# Patient Record
Sex: Male | Born: 1970 | Race: Black or African American | Hispanic: No | Marital: Single | Smoking: Current some day smoker
Health system: Southern US, Community
[De-identification: ages and names within clinical notes are randomized; demographics above are authoritative.]

## PROBLEM LIST (undated history)

## (undated) DIAGNOSIS — M5430 Sciatica, unspecified side: Secondary | ICD-10-CM

## (undated) DIAGNOSIS — J45909 Unspecified asthma, uncomplicated: Secondary | ICD-10-CM

## (undated) DIAGNOSIS — K219 Gastro-esophageal reflux disease without esophagitis: Secondary | ICD-10-CM

## (undated) DIAGNOSIS — M1990 Unspecified osteoarthritis, unspecified site: Secondary | ICD-10-CM

## (undated) DIAGNOSIS — J449 Chronic obstructive pulmonary disease, unspecified: Secondary | ICD-10-CM

---

## 2006-08-10 ENCOUNTER — Emergency Department (HOSPITAL_COMMUNITY): Admission: EM | Admit: 2006-08-10 | Discharge: 2006-08-10 | Payer: Self-pay | Admitting: Emergency Medicine

## 2006-09-27 ENCOUNTER — Emergency Department (HOSPITAL_COMMUNITY): Admission: EM | Admit: 2006-09-27 | Discharge: 2006-09-27 | Payer: Self-pay | Admitting: Emergency Medicine

## 2007-02-14 ENCOUNTER — Emergency Department (HOSPITAL_COMMUNITY): Admission: EM | Admit: 2007-02-14 | Discharge: 2007-02-14 | Payer: Self-pay | Admitting: Emergency Medicine

## 2007-03-16 ENCOUNTER — Ambulatory Visit: Payer: Self-pay | Admitting: Internal Medicine

## 2007-04-01 ENCOUNTER — Ambulatory Visit: Payer: Self-pay | Admitting: Internal Medicine

## 2007-05-16 ENCOUNTER — Ambulatory Visit: Payer: Self-pay | Admitting: Internal Medicine

## 2007-07-11 ENCOUNTER — Ambulatory Visit: Payer: Self-pay | Admitting: Family Medicine

## 2008-01-23 ENCOUNTER — Emergency Department (HOSPITAL_COMMUNITY): Admission: EM | Admit: 2008-01-23 | Discharge: 2008-01-23 | Payer: Self-pay | Admitting: Emergency Medicine

## 2008-02-29 ENCOUNTER — Emergency Department (HOSPITAL_COMMUNITY): Admission: EM | Admit: 2008-02-29 | Discharge: 2008-02-29 | Payer: Self-pay | Admitting: Emergency Medicine

## 2008-04-04 ENCOUNTER — Emergency Department (HOSPITAL_COMMUNITY): Admission: EM | Admit: 2008-04-04 | Discharge: 2008-04-04 | Payer: Self-pay | Admitting: Emergency Medicine

## 2008-04-05 ENCOUNTER — Ambulatory Visit: Payer: Self-pay | Admitting: *Deleted

## 2008-05-06 ENCOUNTER — Emergency Department (HOSPITAL_COMMUNITY): Admission: EM | Admit: 2008-05-06 | Discharge: 2008-05-06 | Payer: Self-pay | Admitting: Emergency Medicine

## 2008-06-30 ENCOUNTER — Emergency Department (HOSPITAL_COMMUNITY): Admission: EM | Admit: 2008-06-30 | Discharge: 2008-06-30 | Payer: Self-pay | Admitting: Emergency Medicine

## 2008-07-01 ENCOUNTER — Emergency Department (HOSPITAL_COMMUNITY): Admission: EM | Admit: 2008-07-01 | Discharge: 2008-07-01 | Payer: Self-pay | Admitting: Emergency Medicine

## 2008-07-09 ENCOUNTER — Emergency Department (HOSPITAL_COMMUNITY): Admission: EM | Admit: 2008-07-09 | Discharge: 2008-07-09 | Payer: Self-pay | Admitting: Emergency Medicine

## 2008-07-21 ENCOUNTER — Emergency Department (HOSPITAL_COMMUNITY): Admission: EM | Admit: 2008-07-21 | Discharge: 2008-07-21 | Payer: Self-pay | Admitting: Emergency Medicine

## 2008-07-29 ENCOUNTER — Emergency Department (HOSPITAL_COMMUNITY): Admission: EM | Admit: 2008-07-29 | Discharge: 2008-07-30 | Payer: Self-pay | Admitting: Emergency Medicine

## 2008-07-29 ENCOUNTER — Emergency Department (HOSPITAL_COMMUNITY): Admission: EM | Admit: 2008-07-29 | Discharge: 2008-07-29 | Payer: Self-pay | Admitting: Emergency Medicine

## 2008-08-05 ENCOUNTER — Emergency Department (HOSPITAL_COMMUNITY): Admission: EM | Admit: 2008-08-05 | Discharge: 2008-08-06 | Payer: Self-pay | Admitting: Emergency Medicine

## 2008-08-08 ENCOUNTER — Ambulatory Visit: Payer: Self-pay | Admitting: Internal Medicine

## 2008-09-15 ENCOUNTER — Emergency Department (HOSPITAL_COMMUNITY): Admission: EM | Admit: 2008-09-15 | Discharge: 2008-09-15 | Payer: Self-pay | Admitting: Emergency Medicine

## 2008-09-21 ENCOUNTER — Encounter (INDEPENDENT_AMBULATORY_CARE_PROVIDER_SITE_OTHER): Payer: Self-pay | Admitting: Adult Health

## 2008-09-21 ENCOUNTER — Ambulatory Visit: Payer: Self-pay | Admitting: Internal Medicine

## 2008-09-21 LAB — CONVERTED CEMR LAB
AST: 29 units/L (ref 0–37)
Albumin: 4.5 g/dL (ref 3.5–5.2)
CO2: 24 meq/L (ref 19–32)
Creatinine, Ser: 1.54 mg/dL — ABNORMAL HIGH (ref 0.40–1.50)
Eosinophils Relative: 13 % — ABNORMAL HIGH (ref 0–5)
Glucose, Bld: 88 mg/dL (ref 70–99)
Lymphocytes Relative: 29 % (ref 12–46)
MCHC: 32.6 g/dL (ref 30.0–36.0)
MCV: 97.5 fL (ref 78.0–100.0)
Neutro Abs: 2.9 10*3/uL (ref 1.7–7.7)
Total Bilirubin: 0.4 mg/dL (ref 0.3–1.2)
Total CHOL/HDL Ratio: 3.3
Total Protein: 7.5 g/dL (ref 6.0–8.3)

## 2008-10-11 ENCOUNTER — Emergency Department (HOSPITAL_COMMUNITY): Admission: EM | Admit: 2008-10-11 | Discharge: 2008-10-11 | Payer: Self-pay | Admitting: Family Medicine

## 2008-10-17 ENCOUNTER — Encounter: Admission: RE | Admit: 2008-10-17 | Discharge: 2008-11-05 | Payer: Self-pay | Admitting: Orthopedic Surgery

## 2008-10-19 ENCOUNTER — Ambulatory Visit: Payer: Self-pay | Admitting: Internal Medicine

## 2008-10-22 ENCOUNTER — Emergency Department (HOSPITAL_COMMUNITY): Admission: EM | Admit: 2008-10-22 | Discharge: 2008-10-22 | Payer: Self-pay | Admitting: Emergency Medicine

## 2011-05-08 LAB — DIFFERENTIAL
Basophils Absolute: 0.1
Eosinophils Absolute: 0.3
Eosinophils Relative: 5
Lymphocytes Relative: 23
Lymphs Abs: 1.7
Monocytes Absolute: 0.7
Neutro Abs: 4.5
Neutrophils Relative %: 62

## 2011-05-08 LAB — CBC
MCHC: 34.3
Platelets: 227

## 2011-05-15 LAB — DIFFERENTIAL
Eosinophils Absolute: 0.7 10*3/uL (ref 0.0–0.7)
Eosinophils Relative: 12 % — ABNORMAL HIGH (ref 0–5)
Lymphocytes Relative: 38 % (ref 12–46)
Lymphs Abs: 2.1 10*3/uL (ref 0.7–4.0)
Monocytes Absolute: 0.5 10*3/uL (ref 0.1–1.0)
Monocytes Relative: 9 % (ref 3–12)
Neutro Abs: 2.2 10*3/uL (ref 1.7–7.7)

## 2011-05-15 LAB — CBC
HCT: 46.9 % (ref 39.0–52.0)
Hemoglobin: 15.6 g/dL (ref 13.0–17.0)
MCV: 98 fL (ref 78.0–100.0)

## 2011-05-15 LAB — POCT I-STAT, CHEM 8
Chloride: 104 mEq/L (ref 96–112)
Creatinine, Ser: 1.7 mg/dL — ABNORMAL HIGH (ref 0.4–1.5)
Potassium: 4.4 mEq/L (ref 3.5–5.1)
TCO2: 28 mmol/L (ref 0–100)

## 2013-07-31 ENCOUNTER — Emergency Department (HOSPITAL_COMMUNITY)
Admission: EM | Admit: 2013-07-31 | Discharge: 2013-07-31 | Disposition: A | Discharge status: Home / Self Care | Payer: Medicaid - Out of State | Attending: Emergency Medicine | Admitting: Emergency Medicine

## 2013-07-31 ENCOUNTER — Encounter (HOSPITAL_COMMUNITY): Payer: Self-pay | Admitting: Emergency Medicine

## 2013-07-31 DIAGNOSIS — M5416 Radiculopathy, lumbar region: Secondary | ICD-10-CM

## 2013-07-31 DIAGNOSIS — IMO0002 Reserved for concepts with insufficient information to code with codable children: Secondary | ICD-10-CM | POA: Insufficient documentation

## 2013-07-31 DIAGNOSIS — J45909 Unspecified asthma, uncomplicated: Secondary | ICD-10-CM | POA: Insufficient documentation

## 2013-07-31 DIAGNOSIS — Z87891 Personal history of nicotine dependence: Secondary | ICD-10-CM | POA: Insufficient documentation

## 2013-07-31 HISTORY — DX: Unspecified asthma, uncomplicated: J45.909

## 2013-07-31 MED ORDER — IBUPROFEN 800 MG PO TABS: 800.0000 mg | ORAL_TABLET | Freq: Three times a day (TID) | ORAL | Status: DC

## 2013-07-31 MED ORDER — OXYCODONE-ACETAMINOPHEN 5-325 MG PO TABS: 1.0000 | ORAL_TABLET | ORAL | Status: DC | PRN

## 2013-07-31 NOTE — ED Notes (Signed)
Pt c/o lower back pain with radiation down right leg; pt sts hx of sciatica and feels same

## 2013-07-31 NOTE — ED Notes (Signed)
Pt requesting to speak with charge nurse. Melissa notified.

## 2013-07-31 NOTE — ED Notes (Signed)
Pt ambulated out of room and said he was getting water; advised to wait to see PA and he stated he was not waiting; pt directed to water fountain in lobby

## 2013-07-31 NOTE — ED Provider Notes (Signed)
CSN: [id number]     Arrival date & time 07/31/13  1128 History  This chart was scribed for non-physician practitioner Jaynie Crumble, PA-C, working with Gavin Pound. Oletta Lamas, MD, by Yevette Edwards, ED Scribe. This patient was seen in room TR06C/TR06C and the patient's care was started at 1:17 PM.   First MD Initiated Contact with Patient 07/31/13 1242     Chief Complaint  Patient presents with  . Back Pain   The history is provided by the patient. No language interpreter was used.   HPI Comments: Keith Weber is a 42 y.o. male who presents to the Emergency Department complaining of lower back pain which radiates down his right leg. He denies any recent injuries to his back. The pt has a h/o sciatica and a slit disc, and he reports the current symptoms are similar to previous episodes of sciatica. He is from [place], [place], but he has been visiting [place] for approximately a month, and he is seeking pain medication. His PCP, Dr. Jackolyn Confer, referred him to a pain management clinic, but the pt reports he has not yet received pain medication from the clinic. The pt is a former smoker.    Past Medical History  Diagnosis Date  . Asthma    History reviewed. No pertinent past surgical history. History reviewed. No pertinent family history. History  Substance Use Topics  . Smoking status: Former Games developer  . Smokeless tobacco: Not on file  . Alcohol Use: Yes     Comment: occ    Review of Systems  Musculoskeletal: Positive for back pain and myalgias.  All other systems reviewed and are negative.   Allergies  Amoxicillin and Iodine  Home Medications  No current outpatient prescriptions on file.  Triage Vitals: BP 137/71  Pulse 77  Temp(Src) 97.9 F (36.6 C) (Oral)  Resp 19  Wt 170 lb (77.111 kg)  SpO2 97%  Physical Exam  Nursing note and vitals reviewed. Constitutional: He is oriented to person, place, and time. He appears well-developed and well-nourished. No distress.   HENT:  Head: Normocephalic and atraumatic.  Eyes: EOM are normal.  Neck: Neck supple. No tracheal deviation present.  Cardiovascular: Normal rate.   Pulmonary/Chest: Effort normal. No respiratory distress.  Musculoskeletal: Normal range of motion.  Neurological: He is alert and oriented to person, place, and time.  Skin: Skin is warm and dry.  Psychiatric: He has a normal mood and affect. His behavior is normal.    ED Course  Procedures (including critical care time)  DIAGNOSTIC STUDIES: Oxygen Saturation is 97% on room air, normal by my interpretation.    COORDINATION OF CARE:  1:22 PM- The pt wanted me to contact his PCP to discuss his treatment.   1:24 PM- Consulted the pt's PCP regarding the pt's h/o back pain. Dr. Mayford Knife reports the pt receives percocet and vicodin monthly for the back pain.   1:29 PM- Rechecked pt. Informed pt that he would be treated with pain medication, and the pt agreed to the plan.   Labs Review Labs Reviewed - No data to display Imaging Review No results found.  EKG Interpretation   None       MDM   1. Lumbar radiculopathy     Pt had a very rude behavior from me walking into the room. When I introduced myself, his first sentence was "what are you going to do for me, i am tired of waiting here." Any time I asked him questions, he would not respond,  and finally told me, to callhis doctor. I spoke with his physician Dr. Mayford Knife in [place], who did confirm that pt has history of osteoarthritis and he is on percocet 5mg  once a day. He did not get his medications this month. I will prescribe him 20 tab. He will follow up as soon as gets back to [place].  I did not examine pt, he did not allow me. He did although ambulated with no difficulties, seems to have aLE strength intact.    Filed Vitals:   07/31/13 1133  BP: 137/71  Pulse: 77  Temp: 97.9 F (36.6 C)  TempSrc: Oral  Resp: 19  Weight: 170 lb (77.111 kg)  SpO2: 97%     I  personally performed the services described in this documentation, which was scribed in my presence. The recorded information has been reviewed and is accurate.    Lottie Mussel, PA-C 07/31/13 [phone]

## 2013-07-31 NOTE — ED Notes (Signed)
Press photographer in.

## 2013-07-31 NOTE — ED Notes (Signed)
Charted on wrong chart 

## 2013-08-01 NOTE — ED Provider Notes (Signed)
Medical screening examination/treatment/procedure(s) were performed by non-physician practitioner and as supervising physician I was immediately available for consultation/collaboration.  EKG Interpretation   None         Keith Weber. Merlina Marchena, MD 08/01/13 0830

## 2013-08-31 ENCOUNTER — Emergency Department (HOSPITAL_COMMUNITY)
Admission: EM | Admit: 2013-08-31 | Discharge: 2013-08-31 | Disposition: A | Discharge status: Home / Self Care | Payer: Medicaid - Out of State | Attending: Emergency Medicine | Admitting: Emergency Medicine

## 2013-08-31 ENCOUNTER — Encounter (HOSPITAL_COMMUNITY): Payer: Self-pay | Admitting: Emergency Medicine

## 2013-08-31 DIAGNOSIS — Z760 Encounter for issue of repeat prescription: Secondary | ICD-10-CM | POA: Insufficient documentation

## 2013-08-31 DIAGNOSIS — IMO0002 Reserved for concepts with insufficient information to code with codable children: Secondary | ICD-10-CM | POA: Insufficient documentation

## 2013-08-31 DIAGNOSIS — Z79899 Other long term (current) drug therapy: Secondary | ICD-10-CM | POA: Insufficient documentation

## 2013-08-31 DIAGNOSIS — R209 Unspecified disturbances of skin sensation: Secondary | ICD-10-CM | POA: Insufficient documentation

## 2013-08-31 DIAGNOSIS — Z87891 Personal history of nicotine dependence: Secondary | ICD-10-CM | POA: Insufficient documentation

## 2013-08-31 DIAGNOSIS — M545 Low back pain, unspecified: Secondary | ICD-10-CM | POA: Insufficient documentation

## 2013-08-31 DIAGNOSIS — M549 Dorsalgia, unspecified: Secondary | ICD-10-CM

## 2013-08-31 DIAGNOSIS — J45901 Unspecified asthma with (acute) exacerbation: Secondary | ICD-10-CM | POA: Insufficient documentation

## 2013-08-31 DIAGNOSIS — G8929 Other chronic pain: Secondary | ICD-10-CM | POA: Insufficient documentation

## 2013-08-31 MED ORDER — ALBUTEROL SULFATE HFA 108 (90 BASE) MCG/ACT IN AERS
2.0000 | INHALATION_SPRAY | RESPIRATORY_TRACT | Status: DC | PRN
  Filled 2013-08-31: qty 6.7

## 2013-08-31 MED ORDER — OXYCODONE-ACETAMINOPHEN 5-325 MG PO TABS: 1.0000 | ORAL_TABLET | ORAL | Status: DC | PRN

## 2013-08-31 NOTE — ED Provider Notes (Signed)
CSN: [id number]     Arrival date & time 08/31/13  1228 History   First MD Initiated Contact with Patient 08/31/13 1308     Chief Complaint  Patient presents with  . Back Pain   (Consider location/radiation/quality/duration/timing/severity/associated sxs/prior Treatment) HPI  Pt is a 43 yo male with history of Sciatica and Asthma coming today for medication refill.  He states his mother has had recent health problems and he has come from [place] to stay with her for several months while she gets settled.  He states he has low back pain that radiates with some numbness and tingling into his right leg, which is consistent with his usual presentation for sciatica.  He is not experiencing SOB at this time, but only has 22 pumps in his Albuterol MDI, which he states he uses daily.  He states his PCP is in [place], and he thought he had to come to the ER instead of getting a new, temporary PCP here.  He states he is normally prescribed Percocet and 800 mg Ibuprofen for his back pain and an Albuterol MDI for his asthma  Past Medical History  Diagnosis Date  . Asthma    History reviewed. No pertinent past surgical history. History reviewed. No pertinent family history. History  Substance Use Topics  . Smoking status: Former Games developermoker  . Smokeless tobacco: Not on file  . Alcohol Use: Yes     Comment: occ    Review of Systems  Constitutional: Negative for fever and chills.  Respiratory: Positive for shortness of breath and wheezing. Negative for cough and chest tightness.   Cardiovascular: Negative for chest pain and palpitations.  Gastrointestinal: Negative for nausea, vomiting, abdominal pain and diarrhea.  Endocrine: Negative for polydipsia, polyphagia and polyuria.  Genitourinary: Negative for hematuria and difficulty urinating.  Musculoskeletal: Positive for back pain. Negative for arthralgias and neck pain.  Neurological: Negative for weakness, numbness and headaches.  All  other systems reviewed and are negative.    Allergies  Shellfish allergy; Amoxicillin; and Iodine  Home Medications   Current Outpatient Rx  Name  Route  Sig  Dispense  Refill  . albuterol (PROVENTIL HFA;VENTOLIN HFA) 108 (90 BASE) MCG/ACT inhaler   Inhalation   Inhale 2 puffs into the lungs every 6 (six) hours as needed for wheezing or shortness of breath.         . Fluticasone-Salmeterol (ADVAIR) 250-50 MCG/DOSE AEPB   Inhalation   Inhale 1 puff into the lungs 2 (two) times daily.         Marland Kitchen. oxyCODONE-acetaminophen (PERCOCET) 5-325 MG per tablet   Oral   Take 1 tablet by mouth every 4 (four) hours as needed for severe pain.   20 tablet   0   . pantoprazole (PROTONIX) 20 MG tablet   Oral   Take 20 mg by mouth daily.          BP 167/85  Pulse 83  Temp(Src) 98.2 F (36.8 C) (Oral)  Resp 18  SpO2 99% Physical Exam  General: WDWN in NAD Psych: Normal Mood and Affect. Heart:  No MGR noted Lungs:  Scattered Wheezing.  No accessory muscle use.  Normal effort. Back: No spinous in Cervical, Thoracic, Lumbar areas. Tenderness to Right Buttock that radiates into thigh.  No pain on Weight bearing. Legs:  2+ DP pulse, 2+ Patellar Tendon Reflex Gait:  No abnormalities.  ED Course  Procedures (including critical care time)  3:03 PM Pt has chronic pain.  No  red flags.  Able to ambulate without difficulty, NVI.  i did make mentioned that we do not normally treats chronic pain in ER.  Will give referral resources for outpt f/u.  However, i will prescribed a short course of pain meds.  Pt aware he will not likely receive narcotic pain meds on subsequent visit for chronic pain.  Pt voice understanding and agrees with pman.    Labs Review Labs Reviewed - No data to display Imaging Review No results found.  EKG Interpretation   None       MDM   1. Chronic back pain    BP 167/85  Pulse 83  Temp(Src) 98.2 F (36.8 C) (Oral)  Resp 18  SpO2 99%     Fayrene Helper,  PA-C 08/31/13 1508

## 2013-08-31 NOTE — ED Notes (Signed)
Pt reports having hx of sciatica. Pt reports being here temporarily and unable to see pcp for several months, needs pain meds and is out of alb inhaler. Ambulatory at triage, no resp distress noted.

## 2013-08-31 NOTE — ED Provider Notes (Signed)
  Medical screening examination/treatment/procedure(s) were performed by non-physician practitioner and as supervising physician I was immediately available for consultation/collaboration.     Maisyn Nouri, MD 08/31/13 1529 

## 2013-08-31 NOTE — ED Notes (Signed)
Pt. Asked for inhaler after discharged. Bowie informed.

## 2013-08-31 NOTE — Discharge Instructions (Signed)
Chronic Back Pain ° When back pain lasts longer than 3 months, it is called chronic back pain. People with chronic back pain often go through certain periods that are more intense (flare-ups).  °CAUSES °Chronic back pain can be caused by wear and tear (degeneration) on different structures in your back. These structures include: °· The bones of your spine (vertebrae) and the joints surrounding your spinal cord and nerve roots (facets). °· The strong, fibrous tissues that connect your vertebrae (ligaments). °Degeneration of these structures may result in pressure on your nerves. This can lead to constant pain. °HOME CARE INSTRUCTIONS °· Avoid bending, heavy lifting, prolonged sitting, and activities which make the problem worse. °· Take brief periods of rest throughout the day to reduce your pain. Lying down or standing usually is better than sitting while you are resting. °· Take over-the-counter or prescription medicines only as directed by your caregiver. °SEEK IMMEDIATE MEDICAL CARE IF:  °· You have weakness or numbness in one of your legs or feet. °· You have trouble controlling your bladder or bowels. °· You have nausea, vomiting, abdominal pain, shortness of breath, or fainting. °Document Released: 09/03/2004 Document Revised: 10/19/2011 Document Reviewed: 07/11/2011 °ExitCare® Patient Information ©2014 [employer]. ° ° °Emergency Department Resource Guide °1) Find a Doctor and Pay Out of Pocket °Although you won't have to find out who is covered by your insurance plan, it is a good idea to ask around and get recommendations. You will then need to call the office and see if the doctor you have chosen will accept you as a new patient and what types of options they offer for patients who are self-pay. Some doctors offer discounts or will set up payment plans for their patients who do not have insurance, but you will need to ask so you aren't surprised when you get to your appointment. ° °2) Contact Your Local  Health Department °Not all health departments have doctors that can see patients for sick visits, but many do, so it is worth a call to see if yours does. If you don't know where your local health department is, you can check in your phone book. The CDC also has a tool to help you locate your state's health department, and many state websites also have listings of all of their local health departments. ° °3) Find a Walk-in Clinic °If your illness is not likely to be very severe or complicated, you may want to try a walk in clinic. These are popping up all over the country in pharmacies, drugstores, and shopping centers. They're usually staffed by nurse practitioners or physician assistants that have been trained to treat common illnesses and complaints. They're usually fairly quick and inexpensive. However, if you have serious medical issues or chronic medical problems, these are probably not your best option. ° °No Primary Care Doctor: °- Call Health Connect at  832-8000 - they can help you locate a primary care doctor that  accepts your insurance, provides certain services, etc. °- Physician Referral Service- 1-800-533-3463 ° °Chronic Pain Problems: °Organization         Address  Phone   Notes  °[facility]  (336) 297-2271 Patients need to be referred by their primary care doctor.  ° °Medication Assistance: °Organization         Address  Phone   Notes  °[place] [facility] [address] °[place], [place] [address] (336) 641-8030 --Must be a resident of [place] °--   Must have NO insurance coverage whatsoever (no Medicaid/ Medicare, etc.) °-- The pt. MUST have a primary care doctor that directs their care regularly and follows them in the community °  °[facility]  (866) 331-1348   °[employer]  (888) 892-1162   ° °Agencies that provide inexpensive medical care: °Organization         Address  Phone   Notes  °[facility]  (336) 832-8035    °[facility]    (336) 832-7272   °[facility] [address] °[place], [place] [address] (336) 832-4777   °[facility] [address], °[place] (336) 271-4999   °[facility]    (336) 373-0678   °[facility]    (336) 272-1050   °[facility] ° [address], [place] Phone:  (336) 832-4444, Fax:  (336) 832-4440 Hours of Operation:  9 am - 6 pm, M-F.  Also accepts Medicaid/Medicare and self-pay.  °[facility] ° [address], [place] Phone: (336) 832-3150, Fax: (336) 832-3151. Hours of Operation:  8:30 am - 5:30 pm, M-F.  Also accepts Medicaid and self-pay.  °[facility] [place] [address], [place] Phone: (336) 878-6027   °[facility] [address], [place], [place] (336)723-1848, Ext. 123 Mondays & Thursdays: 7-9 AM.  First 15 patients are seen on a first come, first serve basis. °  ° °Medicaid-accepting [place] Providers: ° °Organization         Address  Phone   Notes  °[facility] [address], [place] (336) 641-2100 Also accepts self-pay patients.  °[facility] [address], [place] ° (336) 856-9996   °[facility] [address], [place] (336) 288-8857   °[facility] [address] [place] [address], [place] (336) 299-7000   °Veita Bland [address], [place]  ° (336) 373-1557 Only accepts [place] Access Medicaid patients after they have their name applied to their card.  ° °Self-Pay (no insurance) in [place]: ° °Organization         Address  Phone   Notes  °Sickle Cell Patients, [facility] [address], [place] (336) 832-1970   °[facility] [address], [place] (336) 832-4400   °[facility] ° [address],  [place] (336) 992-4800   °[facility]/Dr. Osei-Bonsu ° [address] [place] [address], [place] or [address], [place] (336) 841-8500 Phone number for both [place] and [place] locations is the same.  °[facility] [address], [place] (336) 299-0000   °[facility] [address] [place] [address], [place] or [address] (336) 852-7530 °(336) 878-2260   °[facility] [address],  (336) 350-1642, phone; (336) 294-5005, fax Sees patients 1st and 3rd Saturday of every month.  Must not qualify for public or private insurance (i.e. Medicaid, Medicare, [place] Health Choice, Veterans' Benefits) • Household income should be no more than 200% of the poverty level •The clinic cannot treat you if you are pregnant or think you are pregnant • Sexually transmitted diseases are not treated at the clinic.  ° ° °Dental Care: °Organization         Address  Phone  Notes  °  [place] [facility] [address],  (336) 641-6152 Accepts children up to age 21 who are enrolled in Medicaid or [place] Health Choice; pregnant women with a Medicaid card; and children who have applied for Medicaid or [place] Health Choice, but were declined, whose parents can pay a reduced fee at time of service.  °[facility] [place]  [address], [place] (336) 641-7733 Accepts children up to age 21 who are enrolled in Medicaid or [place] Health Choice; pregnant women with a Medicaid card; and children who have applied for Medicaid or [place] Health Choice, but were declined, whose parents can pay a reduced fee at time of service.  °[facility] ° [address], [place] (336) 641-4533 Patients are seen by appointment only. Walk-ins are not accepted. [facility] will see patients 18 years of age and older. °Monday - Tuesday (8am-5pm) °Most Wednesdays  (8:30-5pm) °$30 per visit, cash only  °[facility] ° [address], [place] (336) 641-4533 Patients are seen by appointment only. Walk-ins are not accepted. [facility] will see patients 18 years of age and older. °One Wednesday Evening (Monthly: Volunteer Based).  $30 per visit, cash only  °[facility]  (919) 537-3737 for adults; Children under age 4, call Graduate Pediatric Dentistry at (919) 537-3956. Children aged 4-14, please call (919) 537-3737 to request a pediatric application. ° Dental services are provided in all areas of dental care including fillings, crowns and bridges, complete and partial dentures, implants, gum treatment, root canals, and extractions. Preventive care is also provided. Treatment is provided to both adults and children. °Patients are selected via a lottery and there is often a waiting list. °  °[facility] [address], °[place] ° (336) 763-8833 [email or web address] °  °[facility] [address], [place], [place] (336)723-1848, Ext. 123 Second and Fourth Thursday of each month, opens at 6:30 AM; Clinic ends at 9 AM.  Patients are seen on a first-come first-served basis, and a limited number are seen during each clinic.  ° °[facility] ° [address], [place], [place] (336) 723-7904   Eligibility Requirements °You must have lived in [place], [place], or [place] counties for at least the last three months. °  You cannot be eligible for state or federal sponsored healthcare insurance, including Veterans Administration, Medicaid, or Medicare. °  You generally cannot be eligible for healthcare insurance through your employer.  °  How to apply: °Eligibility screenings are held every Tuesday and Wednesday afternoon from 1:00 pm until 4:00 pm. You do not need an appointment for the interview!  °[facility] [address], [place], [place] 336-631-2330   °[place]  [facility]  336-342-8273   °[place] [facility]  336-703-3100   °[facility]  336-570-6415   ° °Behavioral Health Resources in the Community: °Intensive Outpatient Programs °Organization         Address  Phone  Notes  °[facility] [address], [place], [place] 336-878-6098   °[facility] [address], [place], [place] 336-832-9800   °[facility] [address], [place], [place] ° 336-882-2125   °[facility] [address],  °[place], [place] 1-800-853-5163 or 336-641-4981   °Substance Abuse Resources °Organization           Address  Phone  Notes  °[facility]  336-882-2125   °[facility]  336-784-9470   °[facility]  336-285-9073   °[facility]  336-845-3988   °Residential & Outpatient Substance Abuse Program  1-800-659-3381   °Psychological Services °Organization         Address  Phone  Notes  ° Health  336- 832-9600   °[facility]  336- 378-7881   °[facility] [address], [place] 1-800-853-5163 or 336-641-4981   ° °Mobile Crisis Teams °Organization         Address  Phone  Notes  °[facility], Mobile Crisis Care Unit  1-877-626-1772   °Assertive °[facility] ° [address] [place], [place] 336-834-9664   °Sharon DeEsch [address] °[place] 336-554-5454   ° °Self-Help/Support Groups °Organization         Address  Phone             Notes  °[facility] - variety of support groups  336- 373-1402 Call for more information  °Narcotics Anonymous (NA), [facility] [address], °[place]  2 meetings at this location  ° °Residential Treatment Programs °Organization         Address  Phone  Notes  °[facility] [address],    °[place]  1-866-801-8205   °[facility] ° [address], [place], [place]  704-293-8524   °[facility] [address], [place] 336-845-3988 Admissions: 8am-3pm M-F  °[facility] [address]    °[place], [place] 336-841-1104   °[facility] [address], [place], [place] 336-379-7146   °[facility] [address]  °[place], [place] 336-285-9073   °[facility] - Intensive Outpatient [address], [place], [place] 336-852-3033   °[facility] [address]  °[place], [place] 1-877-615-2722 or 336-784-9470   °[facility] [address] [place], [place] 336-227-7417 Accepts Medicaid  °[facility] [address]  ° [place] 1-800-659-3381 Substance Abuse/Addiction Treatment  ° °[place] Behavioral Health Resources °Organization         Address  Phone  Notes  °[facility]  (888) 581-9988   °Julie Brannon, PhD [address] [place], [place]   (336) 349-5553 or (336) 951-0000   °[facility]   [address] °[place], [place] (336) 349-4454   °[facility] [address], [place], [place] (336) 342-8316 Insurance/Medicaid/sponsorship through [facility]  °[facility] [address]                                    [place], [place] (336) 342-8316 Therapy/tele-psych/case  °[facility] [address]  ° [place], [place] (336) 349-2233    °Dr. Arfeen  (336) 349-4544   °[facility] [place]  [employer] [place] [facility] 1) [address],  °2) [address], [place] °3)  [address], [place] (336) 349-3220 °(336) 342-7768 ° °(336) 342-8140   °[place] Child Abuse [facility] (336) 342-1394 or (336) 342-3537 (After Hours)    ° ° ° °

## 2013-08-31 NOTE — ED Notes (Signed)
Pt here for c/o back pain. Was here 12/22 for same. States his PCP is in [place]. Here for pain meds, Advair inhaler and Albuterol inhaler.

## 2013-09-05 ENCOUNTER — Encounter (HOSPITAL_COMMUNITY): Payer: Self-pay | Admitting: Emergency Medicine

## 2013-09-05 ENCOUNTER — Emergency Department (HOSPITAL_COMMUNITY): Payer: Medicaid - Out of State

## 2013-09-05 ENCOUNTER — Emergency Department (HOSPITAL_COMMUNITY)
Admission: EM | Admit: 2013-09-05 | Discharge: 2013-09-06 | Disposition: A | Discharge status: Home / Self Care | Payer: Medicaid - Out of State | Attending: Emergency Medicine | Admitting: Emergency Medicine

## 2013-09-05 DIAGNOSIS — J441 Chronic obstructive pulmonary disease with (acute) exacerbation: Secondary | ICD-10-CM

## 2013-09-05 DIAGNOSIS — Z87891 Personal history of nicotine dependence: Secondary | ICD-10-CM | POA: Insufficient documentation

## 2013-09-05 DIAGNOSIS — J45901 Unspecified asthma with (acute) exacerbation: Principal | ICD-10-CM

## 2013-09-05 DIAGNOSIS — IMO0002 Reserved for concepts with insufficient information to code with codable children: Secondary | ICD-10-CM | POA: Insufficient documentation

## 2013-09-05 DIAGNOSIS — Z79899 Other long term (current) drug therapy: Secondary | ICD-10-CM | POA: Insufficient documentation

## 2013-09-05 MED ORDER — ALBUTEROL SULFATE (2.5 MG/3ML) 0.083% IN NEBU
5.0000 mg | INHALATION_SOLUTION | Freq: Once | RESPIRATORY_TRACT | Status: AC
  Administered 2013-09-05: 5 mg via RESPIRATORY_TRACT
  Filled 2013-09-05: qty 6

## 2013-09-05 MED ORDER — ALBUTEROL (5 MG/ML) CONTINUOUS INHALATION SOLN
10.0000 mg/h | INHALATION_SOLUTION | Freq: Once | RESPIRATORY_TRACT | Status: AC
  Administered 2013-09-05: 10 mg/h via RESPIRATORY_TRACT
  Filled 2013-09-05: qty 20

## 2013-09-05 MED ORDER — PREDNISONE 20 MG PO TABS
60.0000 mg | ORAL_TABLET | Freq: Once | ORAL | Status: AC
  Administered 2013-09-05: 60 mg via ORAL
  Filled 2013-09-05: qty 3

## 2013-09-05 NOTE — ED Notes (Signed)
Pt refusing labs and IV at this time. States he only came in for a breathing treatment.

## 2013-09-05 NOTE — ED Provider Notes (Signed)
CSN: [id number]     Arrival date & time 09/05/13  2127 History   First MD Initiated Contact with Patient 09/05/13 2253     Chief Complaint  Patient presents with  . Shortness of Breath   (Consider location/radiation/quality/duration/timing/severity/associated sxs/prior Treatment) HPI 43 year old male presents to the emergency department via EMS with complaint of shortness of breath.  Patient has history of COPD.  He reports taking albuterol and Advair without improvement in wheezing today.  He received albuterol Atrovent via EMS, and a second treatment by emergency department.  Still with wheezing.  He denies any known triggers.  Patient is from [place], currently helping his mom locally, plans to be in the area for the next one to 2 months.  He denies any fever, no change in sputum  Past Medical History  Diagnosis Date  . Asthma    History reviewed. No pertinent past surgical history. History reviewed. No pertinent family history. History  Substance Use Topics  . Smoking status: Former Games developermoker  . Smokeless tobacco: Not on file  . Alcohol Use: Yes     Comment: occ    Review of Systems  See History of Present Illness; otherwise all other systems are reviewed and negative Allergies  Shellfish allergy; Amoxicillin; and Iodine  Home Medications   Current Outpatient Rx  Name  Route  Sig  Dispense  Refill  . albuterol (PROVENTIL HFA;VENTOLIN HFA) 108 (90 BASE) MCG/ACT inhaler   Inhalation   Inhale 2 puffs into the lungs every 6 (six) hours as needed for wheezing or shortness of breath.         . Fluticasone-Salmeterol (ADVAIR) 250-50 MCG/DOSE AEPB   Inhalation   Inhale 1 puff into the lungs 2 (two) times daily.         Marland Kitchen. oxyCODONE-acetaminophen (PERCOCET) 5-325 MG per tablet   Oral   Take 1 tablet by mouth every 4 (four) hours as needed for severe pain.   10 tablet   0   . pantoprazole (PROTONIX) 20 MG tablet   Oral   Take 20 mg by mouth daily.          BP 130/79   Pulse 83  Temp(Src) 98.7 F (37.1 C) (Oral)  Resp 20  Ht 5\' 11"  (1.803 m)  Wt 170 lb (77.111 kg)  BMI 23.72 kg/m2  SpO2 97% Physical Exam  Nursing note and vitals reviewed. Constitutional: He is oriented to person, place, and time. He appears well-developed and well-nourished.  HENT:  Head: Normocephalic and atraumatic.  Nose: Nose normal.  Mouth/Throat: Oropharynx is clear and moist.  Eyes: Conjunctivae and EOM are normal. Pupils are equal, round, and reactive to light.  Neck: Normal range of motion. Neck supple. No JVD present. No tracheal deviation present. No thyromegaly present.  Cardiovascular: Normal rate, regular rhythm, normal heart sounds and intact distal pulses.  Exam reveals no gallop and no friction rub.   No murmur heard. Pulmonary/Chest: Effort normal. No stridor. No respiratory distress. He has wheezes. He has no rales. He exhibits no tenderness.  Abdominal: Soft. Bowel sounds are normal. He exhibits no distension and no mass. There is no tenderness. There is no rebound and no guarding.  Musculoskeletal: Normal range of motion. He exhibits no edema and no tenderness.  Lymphadenopathy:    He has no cervical adenopathy.  Neurological: He is alert and oriented to person, place, and time. He exhibits normal muscle tone. Coordination normal.  Skin: Skin is warm and dry. No rash noted. No erythema.  No pallor.  Psychiatric: He has a normal mood and affect. His behavior is normal. Judgment and thought content normal.    ED Course  Procedures (including critical care time) Labs Review Labs Reviewed - No data to display Imaging Review Dg Chest 2 View (if Patient Has Fever And/or Copd)  09/05/2013   CLINICAL DATA:  Shortness of breath and chest tightness  EXAM: CHEST  2 VIEW  COMPARISON:  08/06/2008  FINDINGS: Stable hyperinflation. Normal heart size and vascularity. No edema, focal pneumonia, collapse consolidation. No effusion or pneumothorax. Trachea midline.  IMPRESSION:  Stable hyperinflation.  No superimposed acute process   Electronically Signed   By: Ruel Favors M.D.   On: 09/05/2013 22:02    EKG Interpretation    Date/Time:  Tuesday September 05 2013 21:34:01 EST Ventricular Rate:  104 PR Interval:  133 QRS Duration: 84 QT Interval:  322 QTC Calculation: 423 R Axis:   93 Text Interpretation:  Sinus tachycardia Right atrial enlargement Borderline right axis deviation Left ventricular hypertrophy ST elev, probable normal early repol pattern Baseline wander in lead(s) V5 V6 Since last tracing Rate faster and ST elevation is resolved Confirmed by Effie Shy  MD, ELLIOTT ([id number]) on 09/05/2013 10:07:59 PM            MDM   1. COPD exacerbation    43 year old male with persistent wheezing after 2 nebs, plan for hour-long cat, prednisone.  12:59 AM PT with some residual wheezing, but normal 02 sats.  Plan for 5 days of prednisone, albuterol.   Olivia Mackie, MD 09/06/13 0100

## 2013-09-05 NOTE — ED Notes (Signed)
Pt arrives via EMS c/o SOB. Hx COPD. IEW on arrival. Pt used his 2 inhalers. Alb/atr given by EMS - relieved SOB some. IEW still present. Prod cough present. VS: 99% RA 130/96 RR 20 HR 96.

## 2013-09-05 NOTE — ED Notes (Signed)
Patient transported to X-ray 

## 2013-09-06 MED ORDER — FLUTICASONE-SALMETEROL 250-50 MCG/DOSE IN AEPB: 1.0000 | INHALATION_SPRAY | Freq: Two times a day (BID) | RESPIRATORY_TRACT | Status: DC

## 2013-09-06 MED ORDER — ALBUTEROL SULFATE HFA 108 (90 BASE) MCG/ACT IN AERS: 2.0000 | INHALATION_SPRAY | Freq: Four times a day (QID) | RESPIRATORY_TRACT | Status: DC | PRN

## 2013-09-06 MED ORDER — PREDNISONE 20 MG PO TABS: 60.0000 mg | ORAL_TABLET | Freq: Every day | ORAL | Status: DC

## 2013-09-06 NOTE — Discharge Instructions (Signed)
Chronic Obstructive Pulmonary Disease Exacerbation °Chronic obstructive pulmonary disease (COPD) is a common lung condition in which airflow from the lungs is limited. COPD is a general term that can be used to describe many different lung problems that limit airflow, including chronic bronchitis and emphysema. COPD exacerbations are episodes when breathing symptoms become much worse and require extra treatment. Without treatment, COPD exacerbations can be life threatening, and frequent COPD exacerbations can cause further damage to your lungs. °CAUSES  °· Respiratory infections.   °· Exposure to smoke.   °· Exposure to air pollution, chemical fumes, or dust. °Sometimes there is no apparent cause or trigger. °RISK FACTORS °· Smoking cigarettes. °· Older age. °· Frequent prior COPD exacerbations. °SIGNS AND SYMPTOMS  °· Increased coughing.   °· Increased thick spit (sputum) production.   °· Increased wheezing.   °· Increased shortness of breath.   °· Rapid breathing.   °· Chest tightness. °DIAGNOSIS  °Your medical history, a physical exam, and tests will help your health care provider make a diagnosis. Tests may include: °· A chest X-ray. °· Basic lab tests. °· Sputum testing. °· An arterial blood gas test. °TREATMENT  °Depending on the severity of your COPD exacerbation, you may need to be admitted to a hospital for treatment. Some of the treatments commonly used to treat COPD exacerbations are:  °· Antibiotic medicines.   °· Bronchodilators. These are drugs that expand the air passages. They may be given with an inhaler or nebulizer. Spacer devices may be needed to help improve drug delivery. °· Corticosteroid medicines. °· Supplemental oxygen therapy.   °HOME CARE INSTRUCTIONS  °· Do not smoke. Quitting smoking is very important to prevent COPD from getting worse and exacerbations from happening as often. °· Avoid exposure to all substances that irritate the airway, especially to tobacco smoke.   °· If prescribed,  take your antibiotics as directed. Finish them even if you start to feel better. °· Only take over-the-counter or prescription medicines as directed by your health care provider. It is important to use correct technique with inhaled medicines. °· Drink enough fluids to keep your urine clear or pale yellow (unless you have a medical condition that requires fluid restriction). °· Use a cool mist vaporizer. This makes it easier to clear your chest when you cough.   °· If you have a home nebulizer and oxygen, continue to use them as directed.   °· Maintain all necessary vaccinations to prevent infections.   °· Exercise regularly.   °· Eat a healthy diet.   °· Keep all follow-up appointments as directed by your health care provider. °SEEK IMMEDIATE MEDICAL CARE IF: °· You have worsening shortness of breath.   °· You have trouble talking.   °· You have severe chest pain. °· You have blood in your sputum.  °· You have a fever. °· You have weakness, vomit repeatedly, or faint.   °· You feel confused.   °· You continue to get worse. °MAKE SURE YOU:  °· Understand these instructions. °· Will watch your condition. °· Will get help right away if you are not doing well or get worse. °Document Released: 05/24/2007 Document Revised: 05/17/2013 Document Reviewed: 03/31/2013 °ExitCare® Patient Information ©2014 [employer]. ° °

## 2013-10-29 ENCOUNTER — Emergency Department (INDEPENDENT_AMBULATORY_CARE_PROVIDER_SITE_OTHER)
Admission: EM | Admit: 2013-10-29 | Discharge: 2013-10-29 | Disposition: A | Discharge status: Home / Self Care | Payer: PRIVATE HEALTH INSURANCE | Source: Home / Self Care

## 2013-10-29 ENCOUNTER — Encounter (HOSPITAL_COMMUNITY): Payer: Self-pay | Admitting: Emergency Medicine

## 2013-10-29 DIAGNOSIS — M5430 Sciatica, unspecified side: Secondary | ICD-10-CM

## 2013-10-29 DIAGNOSIS — M545 Low back pain, unspecified: Secondary | ICD-10-CM

## 2013-10-29 DIAGNOSIS — J45998 Other asthma: Secondary | ICD-10-CM

## 2013-10-29 DIAGNOSIS — M5440 Lumbago with sciatica, unspecified side: Secondary | ICD-10-CM

## 2013-10-29 MED ORDER — TRIAMCINOLONE ACETONIDE 40 MG/ML IJ SUSP
INTRAMUSCULAR | Status: AC
  Filled 2013-10-29: qty 1

## 2013-10-29 MED ORDER — METHYLPREDNISOLONE 4 MG PO KIT: PACK | ORAL | Status: DC

## 2013-10-29 MED ORDER — METHYLPREDNISOLONE ACETATE 80 MG/ML IJ SUSP
INTRAMUSCULAR | Status: AC
  Filled 2013-10-29: qty 1

## 2013-10-29 MED ORDER — FLUTICASONE-SALMETEROL 250-50 MCG/DOSE IN AEPB: 1.0000 | INHALATION_SPRAY | Freq: Two times a day (BID) | RESPIRATORY_TRACT | Status: DC

## 2013-10-29 MED ORDER — CYCLOBENZAPRINE HCL 5 MG PO TABS: 5.0000 mg | ORAL_TABLET | Freq: Three times a day (TID) | ORAL | Status: DC | PRN

## 2013-10-29 MED ORDER — TRIAMCINOLONE ACETONIDE 40 MG/ML IJ SUSP
40.0000 mg | Freq: Once | INTRAMUSCULAR | Status: AC
  Administered 2013-10-29: 40 mg via INTRAMUSCULAR

## 2013-10-29 MED ORDER — METHYLPREDNISOLONE ACETATE 40 MG/ML IJ SUSP
80.0000 mg | Freq: Once | INTRAMUSCULAR | Status: AC
  Administered 2013-10-29: 80 mg via INTRAMUSCULAR

## 2013-10-29 NOTE — Discharge Instructions (Signed)
Take medicine as prescribed and see orthopedist if further issues.

## 2013-10-29 NOTE — ED Notes (Signed)
Patient complains of lower back pain; states was diagnosed with sciatica; states pain got really bad yesterday.

## 2013-10-29 NOTE — ED Notes (Signed)
I explained proper procedure for refill of narcotics and why he was prescribed anti inflammatory Rx and injections. Patient stated that "I do not appreciate the way I was interrogated." Patient went on to say that, "I am going to make sure someone gets in trouble for that." I told the patient that we are not a primary care facility and that the treatment of steroids for his inflammation of his sciatica was all that we could do. He stated that he was not happy and that if he was at his home PCP he would have gotten a Rx of percocet.

## 2013-10-29 NOTE — ED Provider Notes (Signed)
CSN: [id number]     Arrival date & time 10/29/13  1443 History   None    Chief Complaint  Patient presents with  . Back Pain   (Consider location/radiation/quality/duration/timing/severity/associated sxs/prior Treatment) Patient is a 43 y.o. male presenting with back pain. The history is provided by the patient.  Back Pain Location:  Lumbar spine Quality:  Shooting Radiates to:  R posterior upper leg Pain severity:  Moderate Onset quality:  Gradual Progression:  Worsening Chronicity:  Chronic Context comment:  H/o sciatica dx'd in [place]., , chronic, reports sx flare yest. Associated symptoms: no abdominal pain, no bladder incontinence, no bowel incontinence, no fever, no leg pain, no numbness, no paresthesias, no tingling and no weakness     Past Medical History  Diagnosis Date  . Asthma    History reviewed. No pertinent past surgical history. No family history on file. History  Substance Use Topics  . Smoking status: Former Games developermoker  . Smokeless tobacco: Not on file  . Alcohol Use: Yes     Comment: occ    Review of Systems  Constitutional: Negative.  Negative for fever.  Gastrointestinal: Negative.  Negative for abdominal pain and bowel incontinence.  Genitourinary: Negative for bladder incontinence.  Musculoskeletal: Positive for back pain. Negative for gait problem.  Neurological: Negative for tingling, weakness, numbness and paresthesias.    Allergies  Shellfish allergy; Amoxicillin; and Iodine  Home Medications   Current Outpatient Rx  Name  Route  Sig  Dispense  Refill  . oxyCODONE-acetaminophen (PERCOCET) 5-325 MG per tablet   Oral   Take 1 tablet by mouth every 4 (four) hours as needed for severe pain.   10 tablet   0   . pantoprazole (PROTONIX) 20 MG tablet   Oral   Take 20 mg by mouth daily.         Marland Kitchen. albuterol (PROVENTIL HFA;VENTOLIN HFA) 108 (90 BASE) MCG/ACT inhaler   Inhalation   Inhale 2 puffs into the lungs every 6 (six) hours as needed for  wheezing or shortness of breath.   1 Inhaler   0   . cyclobenzaprine (FLEXERIL) 5 MG tablet   Oral   Take 1 tablet (5 mg total) by mouth 3 (three) times daily as needed for muscle spasms.   30 tablet   0   . Fluticasone-Salmeterol (ADVAIR DISKUS) 250-50 MCG/DOSE AEPB   Inhalation   Inhale 1 puff into the lungs 2 (two) times daily.   60 each   0   . Fluticasone-Salmeterol (ADVAIR) 250-50 MCG/DOSE AEPB   Inhalation   Inhale 1 puff into the lungs 2 (two) times daily.   60 each   0   . methylPREDNISolone (MEDROL DOSEPAK) 4 MG tablet      follow package directions, start on mon, take until finished   21 tablet   0   . predniSONE (DELTASONE) 20 MG tablet   Oral   Take 3 tablets (60 mg total) by mouth daily.   15 tablet   0    BP 127/74  Pulse 76  Temp(Src) 98 F (36.7 C) (Oral)  Resp 17  SpO2 96% Physical Exam  Nursing note and vitals reviewed. Constitutional: He is oriented to person, place, and time. He appears well-developed and well-nourished.  Cardiovascular: Normal heart sounds.   Pulmonary/Chest: Effort normal and breath sounds normal.  Abdominal: Soft. Bowel sounds are normal. He exhibits no distension. There is no tenderness.  Musculoskeletal: He exhibits tenderness.       Lumbar  back: He exhibits tenderness and pain. He exhibits no spasm and normal pulse.       Back:  Neurological: He is alert and oriented to person, place, and time.  Skin: Skin is warm and dry.    ED Course  Procedures (including critical care time) Labs Review Labs Reviewed - No data to display Imaging Review No results found.   MDM   1. Back pain of lumbar region with sciatica   2. Asthma in remission        Linna Hoff, MD 10/29/13 [phone]

## 2013-10-30 ENCOUNTER — Encounter (HOSPITAL_COMMUNITY): Payer: Self-pay | Admitting: Emergency Medicine

## 2013-10-30 ENCOUNTER — Emergency Department (HOSPITAL_COMMUNITY)
Admission: EM | Admit: 2013-10-30 | Discharge: 2013-10-30 | Disposition: A | Discharge status: Home / Self Care | Payer: Medicaid - Out of State | Attending: Emergency Medicine | Admitting: Emergency Medicine

## 2013-10-30 DIAGNOSIS — F17200 Nicotine dependence, unspecified, uncomplicated: Secondary | ICD-10-CM | POA: Insufficient documentation

## 2013-10-30 DIAGNOSIS — M5416 Radiculopathy, lumbar region: Secondary | ICD-10-CM

## 2013-10-30 DIAGNOSIS — K089 Disorder of teeth and supporting structures, unspecified: Secondary | ICD-10-CM | POA: Insufficient documentation

## 2013-10-30 DIAGNOSIS — J45909 Unspecified asthma, uncomplicated: Secondary | ICD-10-CM | POA: Insufficient documentation

## 2013-10-30 DIAGNOSIS — G8929 Other chronic pain: Secondary | ICD-10-CM

## 2013-10-30 DIAGNOSIS — Z79899 Other long term (current) drug therapy: Secondary | ICD-10-CM | POA: Insufficient documentation

## 2013-10-30 DIAGNOSIS — IMO0002 Reserved for concepts with insufficient information to code with codable children: Secondary | ICD-10-CM | POA: Insufficient documentation

## 2013-10-30 HISTORY — DX: Sciatica, unspecified side: M54.30

## 2013-10-30 MED ORDER — OXYCODONE-ACETAMINOPHEN 5-325 MG PO TABS: 1.0000 | ORAL_TABLET | Freq: Three times a day (TID) | ORAL | Status: DC | PRN

## 2013-10-30 NOTE — ED Notes (Signed)
[facility] and said his doctor in [place] said there was a contraindication for him taking Flexeril with his asthma and is asking for something else.  He had not filled his other Rx.'s yet of Advair and Medrol dose pack.  He said he  Has chronic back pain and sciatica.  He used to go to a pain clinic in [place] for his back pain.  I told him he would need to get into a pain clinic here for management of his chronic back pain.  He asked me if I knew of any.  I gave [facility] the number for [facility].  He has "[employer]" which was accepted in [place] but not here. I told him he needs to get a PCP for his asthma and gave him the brochure [facility].  I asked Dr. Artis FlockKindl if Flexeril is contraindicated with asthma and he said no. Pt. given this information. Vassie MoselleYork, Lanya Bucks M 10/30/2013

## 2013-10-30 NOTE — ED Notes (Signed)
PT reports he returned tonight because [place] [facility] [facility] will not cover the RX's he was given on 10-29-13. Pt reports His insurance will cover Oxycodone.

## 2013-10-30 NOTE — Discharge Instructions (Signed)
Sciatica °Sciatica is pain, weakness, numbness, or tingling along your sciatic nerve. The nerve starts in the lower back and runs down the back of each leg. Nerve damage or certain conditions pinch or put pressure on the sciatic nerve. This causes the pain, weakness, and other discomforts of sciatica. °HOME CARE  °· Only take medicine as told by your doctor. °· Apply ice to the affected area for 20 minutes. Do this 3 4 times a day for the first 48 72 hours. Then try heat in the same way. °· Exercise, stretch, or do your usual activities if these do not make your pain worse. °· Go to physical therapy as told by your doctor. °· Keep all doctor visits as told. °· Do not wear high heels or shoes that are not supportive. °· Get a firm mattress if your mattress is too soft to lessen pain and discomfort. °GET HELP RIGHT AWAY IF:  °· You cannot control when you poop (bowel movement) or pee (urinate). °· You have more weakness in your lower back, lower belly (pelvis), butt (buttocks), or legs. °· You have redness or puffiness (swelling) of your back. °· You have a burning feeling when you pee. °· You have pain that gets worse when you lie down. °· You have pain that wakes you from your sleep. °· Your pain is worse than past pain. °· Your pain lasts longer than 4 weeks. °· You are suddenly losing weight without reason. °MAKE SURE YOU:  °· Understand these instructions. °· Will watch this condition. °· Will get help right away if you are not doing well or get worse. °Document Released: 05/05/2008 Document Revised: 01/26/2012 Document Reviewed: 12/06/2011 °ExitCare® Patient Information ©2014 [employer]. ° °

## 2013-10-30 NOTE — ED Provider Notes (Signed)
CSN: [id number]     Arrival date & time 10/30/13  1724 History  This chart was scribed for non-physician practitioner Felicie Mornavid Oral Remache, NP working with Glynn OctaveStephen Rancour, MD by Valera CastleSteven Perry, ED scribe. This patient was seen in room TR05C/TR05C and the patient's care was started at 7:25 PM.   Chief Complaint  Patient presents with  . Back Pain   (Consider location/radiation/quality/duration/timing/severity/associated sxs/prior Treatment) Patient is a 43 y.o. male presenting with back pain and tooth pain. The history is provided by the patient. No language interpreter was used.  Back Pain Location:  Sacro-iliac joint Radiates to:  L posterior upper leg and R posterior upper leg Timing:  Intermittent Progression:  Unchanged Chronicity:  Chronic Context comment:  H/o slipped L-5 disc Relieved by: Percocet. Associated symptoms: leg pain   Associated symptoms: no bladder incontinence, no dysuria, no fever, no numbness and no weakness   Dental Pain Location:  Upper (right) Quality:  Aching Associated symptoms: no fever    HPI Comments: Keith Flavinnthony Weber is a 43 y.o. male with h/o chronic back pain, sciatic pain, who presents to the Emergency Department complaining of constant back pain, that intermittently radiates down his LE. He states he was seen by Dr. Melrose NakayamaKindle yesterday, was given 3 scripts for pain management. He has not filled his scripts and is here at the ED requesting Percocet that he usually gets. He states his back pain is due to h/o slipped L-5 disc. [place] and [place] [employer]. He has an appointment tomorrow with social services to help clear up some insurance issues. He denies urinary symptoms, and any other associated symptoms. He denies h/o prostate problems.  He also reports constant, aching, right, upper dental pain.   PCP - No primary provider on file.  Past Medical History  Diagnosis Date  . Asthma   . Sciatic nerve pain    History reviewed.  No pertinent past surgical history. No family history on file. History  Substance Use Topics  . Smoking status: Current Some Day Smoker  . Smokeless tobacco: Not on file  . Alcohol Use: Yes     Comment: occ    Review of Systems  Constitutional: Negative for fever.  HENT: Positive for dental problem (upper, right toothache).   Genitourinary: Negative for bladder incontinence and dysuria.  Musculoskeletal: Positive for back pain (lower).  Neurological: Negative for weakness and numbness.  All other systems reviewed and are negative.   Allergies  Shellfish allergy; Amoxicillin; Iodine; and Peanut-containing drug products  Home Medications   Current Outpatient Rx  Name  Route  Sig  Dispense  Refill  . albuterol (PROVENTIL HFA;VENTOLIN HFA) 108 (90 BASE) MCG/ACT inhaler   Inhalation   Inhale 2 puffs into the lungs every 6 (six) hours as needed for wheezing or shortness of breath.   1 Inhaler   0   . cyclobenzaprine (FLEXERIL) 5 MG tablet   Oral   Take 1 tablet (5 mg total) by mouth 3 (three) times daily as needed for muscle spasms.   30 tablet   0   . Fluticasone-Salmeterol (ADVAIR DISKUS) 250-50 MCG/DOSE AEPB   Inhalation   Inhale 1 puff into the lungs 2 (two) times daily.   60 each   0   . Fluticasone-Salmeterol (ADVAIR) 250-50 MCG/DOSE AEPB   Inhalation   Inhale 1 puff into the lungs 2 (two) times daily.   60 each   0   . methylPREDNISolone (MEDROL DOSEPAK) 4 MG tablet  follow package directions, start on mon, take until finished   21 tablet   0   . oxyCODONE-acetaminophen (PERCOCET) 5-325 MG per tablet   Oral   Take 1 tablet by mouth every 4 (four) hours as needed for severe pain.   10 tablet   0   . pantoprazole (PROTONIX) 20 MG tablet   Oral   Take 20 mg by mouth daily.         . predniSONE (DELTASONE) 20 MG tablet   Oral   Take 3 tablets (60 mg total) by mouth daily.   15 tablet   0    BP 125/86  Pulse 77  Temp(Src) 98.1 F (36.7 C)  (Oral)  Resp 20  Ht 5\' 11"  (1.803 m)  Wt 170 lb (77.111 kg)  BMI 23.72 kg/m2  SpO2 98%  Physical Exam  Nursing note and vitals reviewed. Constitutional: He is oriented to person, place, and time. He appears well-developed and well-nourished. No distress.  HENT:  Head: Normocephalic and atraumatic.  Eyes: EOM are normal.  Neck: Neck supple. No tracheal deviation present.  Cardiovascular: Normal rate.   Pulmonary/Chest: Effort normal. No respiratory distress.  Musculoskeletal: Normal range of motion.  Pain is consistent with sciatic that radiates down his LE.  Neurological: He is alert and oriented to person, place, and time.  Skin: Skin is warm and dry.  Psychiatric: He has a normal mood and affect. His behavior is normal.    ED Course  Procedures (including critical care time)  DIAGNOSTIC STUDIES: Oxygen Saturation is 98% on room air, normal by my interpretation.    COORDINATION OF CARE: 7:42 PM-Discussed treatment plan with pt at bedside and pt agreed to plan.   Labs Review Labs Reviewed - No data to display Imaging Review No results found.   EKG Interpretation None     Medications - No data to display MDM   Final diagnoses:  None    Chronic radicular back pain.  Patient states that percocet is effective for pain control, has tried steroids, muscle relaxants without any significant improvement.  Although seen at urgent care earlier today, they did not provide him any pain medication.  His request in the ED is continuation of percocet for pain management.  I discussed with the patient that frequent use narcotics are not recommend for chronic pain management.  He is scheduled to see social services tomorrow to try and work out his insurance issues,   I personally performed the services described in this documentation, which was scribed in my presence. The recorded information has been reviewed and is accurate.    Jimmye Norman, NP 10/31/13 [phone]

## 2013-10-30 NOTE — ED Notes (Signed)
PT has hx of sciatic pain [place]. PT states he is coming to ED for medication prescriptions. He reports seeing a primary care provider yesterday and was given 3 scripts for pain mgmt but his insurance wouldn't cover 2 of the medications. PT was told that he needed to come to the ED for meds to be covered under the insurance. PT also reports "bad tooth".

## 2013-10-31 NOTE — ED Provider Notes (Signed)
Medical screening examination/treatment/procedure(s) were performed by non-physician practitioner and as supervising physician I was immediately available for consultation/collaboration.   EKG Interpretation None       Cleland Simkins, MD 10/31/13 1014 

## 2014-01-29 ENCOUNTER — Emergency Department (HOSPITAL_COMMUNITY)
Admission: EM | Admit: 2014-01-29 | Discharge: 2014-01-29 | Disposition: A | Discharge status: Home / Self Care | Payer: PRIVATE HEALTH INSURANCE | Attending: Emergency Medicine | Admitting: Emergency Medicine

## 2014-01-29 ENCOUNTER — Encounter (HOSPITAL_COMMUNITY): Payer: Self-pay | Admitting: Emergency Medicine

## 2014-01-29 ENCOUNTER — Emergency Department (HOSPITAL_COMMUNITY): Payer: PRIVATE HEALTH INSURANCE

## 2014-01-29 DIAGNOSIS — F17200 Nicotine dependence, unspecified, uncomplicated: Secondary | ICD-10-CM | POA: Insufficient documentation

## 2014-01-29 DIAGNOSIS — J441 Chronic obstructive pulmonary disease with (acute) exacerbation: Secondary | ICD-10-CM | POA: Insufficient documentation

## 2014-01-29 DIAGNOSIS — J45901 Unspecified asthma with (acute) exacerbation: Principal | ICD-10-CM

## 2014-01-29 DIAGNOSIS — IMO0002 Reserved for concepts with insufficient information to code with codable children: Secondary | ICD-10-CM | POA: Insufficient documentation

## 2014-01-29 DIAGNOSIS — Z79899 Other long term (current) drug therapy: Secondary | ICD-10-CM | POA: Insufficient documentation

## 2014-01-29 DIAGNOSIS — Z8739 Personal history of other diseases of the musculoskeletal system and connective tissue: Secondary | ICD-10-CM | POA: Insufficient documentation

## 2014-01-29 DIAGNOSIS — J4521 Mild intermittent asthma with (acute) exacerbation: Secondary | ICD-10-CM

## 2014-01-29 DIAGNOSIS — Z760 Encounter for issue of repeat prescription: Secondary | ICD-10-CM | POA: Insufficient documentation

## 2014-01-29 DIAGNOSIS — Z880 Allergy status to penicillin: Secondary | ICD-10-CM | POA: Insufficient documentation

## 2014-01-29 LAB — BASIC METABOLIC PANEL
BUN: 11 mg/dL (ref 6–23)
CALCIUM: 8.7 mg/dL (ref 8.4–10.5)
CO2: 23 mEq/L (ref 19–32)
Chloride: 108 mEq/L (ref 96–112)
Creatinine, Ser: 1.34 mg/dL (ref 0.50–1.35)
GFR, EST AFRICAN AMERICAN: 73 mL/min — AB (ref >90)
GFR, EST NON AFRICAN AMERICAN: 63 mL/min — AB (ref >90)
Glucose, Bld: 119 mg/dL — ABNORMAL HIGH (ref 70–99)
Potassium: 4.1 mEq/L (ref 3.7–5.3)
Sodium: 143 mEq/L (ref 137–147)

## 2014-01-29 LAB — CBC
HCT: 40.4 % (ref 39.0–52.0)
Hemoglobin: 14 g/dL (ref 13.0–17.0)
MCH: 33.3 pg (ref 26.0–34.0)
MCHC: 34.7 g/dL (ref 30.0–36.0)
MCV: 96.2 fL (ref 78.0–100.0)
Platelets: 266 10*3/uL (ref 150–400)
RBC: 4.2 MIL/uL — ABNORMAL LOW (ref 4.22–5.81)
RDW: 13.6 % (ref 11.5–15.5)
WBC: 4.6 10*3/uL (ref 4.0–10.5)

## 2014-01-29 LAB — I-STAT TROPONIN, ED: TROPONIN I, POC: 0.02 ng/mL (ref 0.00–0.08)

## 2014-01-29 MED ORDER — PANTOPRAZOLE SODIUM 20 MG PO TBEC: 20.0000 mg | DELAYED_RELEASE_TABLET | Freq: Every day | ORAL | Status: DC

## 2014-01-29 MED ORDER — HYDROCODONE-ACETAMINOPHEN 7.5-325 MG/15ML PO SOLN: 10.0000 mL | Freq: Four times a day (QID) | ORAL | Status: DC | PRN

## 2014-01-29 MED ORDER — IPRATROPIUM-ALBUTEROL 0.5-2.5 (3) MG/3ML IN SOLN
3.0000 mL | Freq: Once | RESPIRATORY_TRACT | Status: AC
  Administered 2014-01-29: 3 mL via RESPIRATORY_TRACT
  Filled 2014-01-29: qty 3

## 2014-01-29 MED ORDER — METHYLPREDNISOLONE 4 MG PO KIT: PACK | ORAL | Status: DC

## 2014-01-29 MED ORDER — ALBUTEROL SULFATE HFA 108 (90 BASE) MCG/ACT IN AERS
2.0000 | INHALATION_SPRAY | RESPIRATORY_TRACT | Status: DC | PRN
  Administered 2014-01-29: 2 via RESPIRATORY_TRACT
  Filled 2014-01-29: qty 6.7

## 2014-01-29 NOTE — ED Notes (Signed)
Per pt sts that he was being treated for COPD in [place] and has ran out of his medications. sts took his last nebulizer this am. sts he has been having a little trouble breathing with the heat and weather.

## 2014-01-29 NOTE — Discharge Instructions (Signed)
Asthma, Acute Bronchospasm °Acute bronchospasm caused by asthma is also referred to as an asthma attack. Bronchospasm means your air passages become narrowed. The narrowing is caused by inflammation and tightening of the muscles in the air tubes (bronchi) in your lungs. This can make it hard to breathe or cause you to wheeze and cough. °CAUSES °Possible triggers are: °· Animal dander from the skin, hair, or feathers of animals. °· Dust mites contained in house dust. °· Cockroaches. °· Pollen from trees or grass. °· Mold. °· Cigarette or tobacco smoke. °· Air pollutants such as dust, household cleaners, hair sprays, aerosol sprays, paint fumes, strong chemicals, or strong odors. °· Cold air or weather changes. Cold air may trigger inflammation. Winds increase molds and pollens in the air. °· Strong emotions such as crying or laughing hard. °· Stress. °· Certain medicines such as aspirin or beta-blockers. °· Sulfites in foods and drinks, such as dried fruits and wine. °· Infections or inflammatory conditions, such as a flu, cold, or inflammation of the nasal membranes (rhinitis). °· Gastroesophageal reflux disease (GERD). GERD is a condition where stomach acid backs up into your esophagus. °· Exercise or strenuous activity. °SIGNS AND SYMPTOMS  °· Wheezing. °· Excessive coughing, particularly at night. °· Chest tightness. °· Shortness of breath. °DIAGNOSIS  °Your health care provider will ask you about your medical history and perform a physical exam. A chest X-ray or blood testing may be performed to look for other causes of your symptoms or other conditions that may have triggered your asthma attack.  °TREATMENT  °Treatment is aimed at reducing inflammation and opening up the airways in your lungs.  Most asthma attacks are treated with inhaled medicines. These include quick relief or rescue medicines (such as bronchodilators) and controller medicines (such as inhaled corticosteroids). These medicines are sometimes  given through an inhaler or a nebulizer. Systemic steroid medicine taken by mouth or given through an IV tube also can be used to reduce the inflammation when an attack is moderate or severe. Antibiotic medicines are only used if a bacterial infection is present.  °HOME CARE INSTRUCTIONS  °· Rest. °· Drink plenty of liquids. This helps the mucus to remain thin and be easily coughed up. Only use caffeine in moderation and do not use alcohol until you have recovered from your illness. °· Do not smoke. Avoid being exposed to secondhand smoke. °· You play a critical role in keeping yourself in good health. Avoid exposure to things that cause you to wheeze or to have breathing problems. °· Keep your medicines up-to-date and available. Carefully follow your health care provider's treatment plan. °· Take your medicine exactly as prescribed. °· When pollen or pollution is bad, keep windows closed and use an air conditioner or go to places with air conditioning. °· Asthma requires careful medical care. See your health care provider for a follow-up as advised. If you are more than [redacted] weeks pregnant and you were prescribed any new medicines, let your obstetrician know about the visit and how you are doing. Follow up with your health care provider as directed. °· After you have recovered from your asthma attack, make an appointment with your outpatient doctor to talk about ways to reduce the likelihood of future attacks. If you do not have a doctor who manages your asthma, make an appointment with a primary care doctor to discuss your asthma. °SEEK IMMEDIATE MEDICAL CARE IF:  °· You are getting worse. °· You have trouble breathing. If severe, call your local   emergency services (911 in the [place] °· You develop chest pain or discomfort. °· You are vomiting. °· You are not able to keep fluids down. °· You are coughing up yellow, green, brown, or bloody sputum. °· You have a fever and your symptoms suddenly get worse. °· You have  trouble swallowing. °MAKE SURE YOU:  °· Understand these instructions. °· Will watch your condition. °· Will get help right away if you are not doing well or get worse. °Document Released: 11/11/2006 Document Revised: 08/01/2013 Document Reviewed: 02/01/2013 °ExitCare® Patient Information ©2015 [employer]. This information is not intended to replace advice given to you by your health care provider. Make sure you discuss any questions you have with your health care provider. ° ° °Emergency Department Resource Guide °1) Find a Doctor and Pay Out of Pocket °Although you won't have to find out who is covered by your insurance plan, it is a good idea to ask around and get recommendations. You will then need to call the office and see if the doctor you have chosen will accept you as a new patient and what types of options they offer for patients who are self-pay. Some doctors offer discounts or will set up payment plans for their patients who do not have insurance, but you will need to ask so you aren't surprised when you get to your appointment. ° °2) Contact Your Local Health Department °Not all health departments have doctors that can see patients for sick visits, but many do, so it is worth a call to see if yours does. If you don't know where your local health department is, you can check in your phone book. The CDC also has a tool to help you locate your state's health department, and many state websites also have listings of all of their local health departments. ° °3) Find a Walk-in Clinic °If your illness is not likely to be very severe or complicated, you may want to try a walk in clinic. These are popping up all over the country in pharmacies, drugstores, and shopping centers. They're usually staffed by nurse practitioners or physician assistants that have been trained to treat common illnesses and complaints. They're usually fairly quick and inexpensive. However, if you have serious medical issues or chronic  medical problems, these are probably not your best option. ° °No Primary Care Doctor: °- Call [facility] at  832-8000 - they can help you locate a primary care doctor that  accepts your insurance, provides certain services, etc. °- Physician Referral Service- 1-800-533-3463 ° °Chronic Pain Problems: °Organization         Address  Phone   Notes  °[facility]  (336) 297-2271 Patients need to be referred by their primary care doctor.  ° °Medication Assistance: °Organization         Address  Phone   Notes  °[place] [facility] [address] °[place], [place] [address] (336) 641-8030 --Must be a resident of [place] °-- Must have NO insurance coverage whatsoever (no Medicaid/ Medicare, etc.) °-- The pt. MUST have a primary care doctor that directs their care regularly and follows them in the community °  °[facility]  (866) 331-1348   °[employer]  (888) 892-1162   ° °Agencies that provide inexpensive medical care: °Organization         Address  Phone   Notes  °[facility]  (336) 832-8035   °[facility]    (  336) 832-7272   °[facility] [address] °[place], [place] [address] (336) 832-4777   °[facility] [address], °[place] (336) 271-4999   °[facility]    (336) 373-0678   °[facility]    (336) 272-1050   °[facility] ° [address], [place] Phone:  (336) 832-4444, Fax:  (336) 832-4440 Hours of Operation:  9 am - 6 pm, M-F.  Also accepts Medicaid/Medicare and self-pay.  °[facility] ° [address], [place] Phone: (336) 832-3150, Fax: (336) 832-3151. Hours of Operation:  8:30 am - 5:30 pm, M-F.  Also accepts Medicaid and self-pay.  °[facility] [place] [address], [place] Phone: (336) 878-6027   °[facility] [address], [place], [place] (336)723-1848,  Ext. 123 Mondays & Thursdays: 7-9 AM.  First 15 patients are seen on a first come, first serve basis. °  ° °Medicaid-accepting [place] Providers: ° °Organization         Address  Phone   Notes  °[facility] [address], [place] (336) 641-2100 Also accepts self-pay patients.  °[facility] [address], [place] ° (336) 856-9996   °[facility] [address], [place] (336) 288-8857   °[facility] [address] [place] [address], [place] (336) 299-7000   °Veita Bland [address], [place]  ° (336) 373-1557 Only accepts [employer] Access Medicaid patients after they have their name applied to their card.  ° °Self-Pay (no insurance) in [place]: ° °Organization         Address  Phone   Notes  °Sickle Cell Patients, [facility] [address], [place] (336) 832-1970   °[facility] [address], [place] (336) 832-4400   °[facility] [place] ° [address], [place] (336) 992-4800   °[facility]/Dr. Osei-Bonsu ° [address] [place] [address], [place] or [address], [place] (336) 841-8500 Phone number for both [place] and [place] locations is the same.  °[facility] [address], [place] (336) 299-0000   °[facility] [address] [place] [address], [place] or [address] (336) 852-7530 °(336) 878-2260   °[facility] [address],  (336) 350-1642, phone; (336) 294-5005, fax Sees patients 1st and 3rd Saturday of every month.  Must not qualify for public or private insurance (i.e. Medicaid, Medicare, [place] Health Choice, Veterans' Benefits) • Household income should be no more than 200% of the poverty level •The clinic cannot treat you if you are pregnant or think you are pregnant • Sexually transmitted diseases are not  treated at the clinic.  ° ° °Dental Care: °Organization         Address  Phone  Notes  °[facility] [facility] [address],  (336) 641-6152 Accepts children up to age 21 who are enrolled in Medicaid or [place] Health Choice; pregnant women with a Medicaid card; and children who have applied for Medicaid or [place] Health Choice, but were declined, whose parents can pay a reduced fee at time of service.  °[facility] [place]  [address], [place] (336) 641-7733 Accepts children up   to age 21 who are enrolled in Medicaid or [place] Health Choice; pregnant women with a Medicaid card; and children who have applied for Medicaid or [place] Health Choice, but were declined, whose parents can pay a reduced fee at time of service.  °[facility] ° [address], [place] (336) 641-4533 Patients are seen by appointment only. Walk-ins are not accepted. [facility] will see patients 18 years of age and older. °Monday - Tuesday (8am-5pm) °Most Wednesdays (8:30-5pm) °$30 per visit, cash only  °[facility] ° [address], [place] (336) 641-4533 Patients are seen by appointment only. Walk-ins are not accepted. [facility] will see patients 18 years of age and older. °One Wednesday Evening (Monthly: Volunteer Based).  $30 per visit, cash only  °[facility]  (919) 537-3737 for adults; Children under age 4, call Graduate Pediatric Dentistry at (919) [phone]. Children aged 4-14, please call (919) [phone] to request a pediatric application. ° Dental services are provided in all areas of dental care including fillings, crowns and bridges, complete and partial dentures, implants, gum treatment, root canals, and extractions. Preventive care is also provided. Treatment is provided to both adults and children. °Patients are selected via a lottery and there is  often a waiting list. °  °[facility] [address], °[place] ° (336) 763-8833 [email or web address] °  °[facility] [address], [place], [place] (336)723-1848, Ext. 123 Second and Fourth Thursday of each month, opens at 6:30 AM; Clinic ends at 9 AM.  Patients are seen on a first-come first-served basis, and a limited number are seen during each clinic.  ° °[facility] ° [address], [place], [place] (336) 723-7904   Eligibility Requirements °You must have lived in [place], [place], or [place] counties for at least the last three months. °  You cannot be eligible for state or federal sponsored healthcare insurance, including Veterans Administration, Medicaid, or Medicare. °  You generally cannot be eligible for healthcare insurance through your employer.  °  How to apply: °Eligibility screenings are held every Tuesday and Wednesday afternoon from 1:00 pm until 4:00 pm. You do not need an appointment for the interview!  °[facility] [address], [place], [place] 336-631-2330   °[facility]  336-342-8273   °[facility]  336-703-3100   °[facility]  336-570-6415   ° °Behavioral Health Resources in the Community: °Intensive Outpatient Programs °Organization         Address  Phone  Notes  °[facility] [address], [place], [place] 336-878-6098   °[facility] [address], [place], [place] 336-832-9800   °[facility] [address], [place], [place] ° 336-882-2125   °[facility] [address],  °[place], [place] 1-800-853-5163 or 336-641-4981   °Substance Abuse Resources °Organization         Address  Phone  Notes  °[facility]  336-882-2125   °[facility]  336-784-9470   °[facility]  336-285-9073   °[facility]  336-845-3988   °Residential & Outpatient Substance  Abuse Program  1-800-659-3381   °Psychological Services °Organization         Address  Phone  Notes  °[facility]  336- 832-9600   °[facility]  336- 378-7881   °[facility]   [address], [place] 1-800-853-5163 or 336-641-4981   ° °Mobile Crisis Teams °Organization         Address  Phone  Notes  °[facility], Mobile Crisis Care Unit  1-877-626-1772   °Assertive °[facility] ° [address] [place], [place] 336-834-9664   °Sharon DeEsch [address] °[place] 336-554-5454   ° °Self-Help/Support Groups °Organization         Address  Phone             Notes  °[facility] [place] - variety of support groups  336- 373-1402 Call for more information  °Narcotics Anonymous (NA), [facility] [address], °[place]  2 meetings at this location  ° °Residential Treatment Programs °Organization         Address  Phone  Notes  °[facility] [address],    °[place]  1-866-801-8205   °[facility] ° [address], [place], [place] 704-293-8524   °[facility] [address], [place] 336-845-3988 Admissions: 8am-3pm M-F  °[facility] [address]    °[place], [place] 336-841-1104   °[facility] [address], [place], [place] 336-379-7146   °[facility] [address]  °[place], [place] 336-285-9073   °[facility] Intensive Outpatient [address], [place], [place] 336-852-3033   °[facility] [address]  °[place], [place] 1-877-615-2722 or 336-784-9470   °[facility] [address] [place], [place] 336-227-7417 Accepts Medicaid  °[facility] [address]  °[place] 1-800-659-3381 Substance Abuse/Addiction Treatment  ° °[place] Behavioral Health Resources °Organization          Address  Phone  Notes  °[facility]  (888) 581-9988   °Julie Brannon, PhD [address] [place], [place]   (336) 349-5553 or (336) 951-0000   °[facility]   [address] °[place], [place] (336) 349-4454   °[facility] [address], [place], [place] (336) 342-8316 Insurance/Medicaid/sponsorship through [facility]  °[facility] [address]                                    [place], [place] (336) 342-8316 Therapy/tele-psych/case  °[facility] [address]  ° [place], [place] (336) 349-2233    °Dr. Arfeen  (336) 349-4544   °[facility] [place]  [employer] [place] [facility] 1) [address], [place] °2) [address], [place] °3)  [address], [place] (336) 349-3220 °(336) 342-7768 ° °(336) 342-8140   °[place] [facility] (336) 342-1394 or (336) 342-3537 (After Hours)    ° ° ° °

## 2014-01-29 NOTE — ED Notes (Signed)
Pt concerned that home nebulizer medication perscription is not in hospital med record/list despite 6 visits to ED since July 31 2013. Per Pt request, revisted d/c perscriptions with PA and PA is aware that no nebulizer Rx was given

## 2014-01-29 NOTE — ED Provider Notes (Signed)
Medical screening examination/treatment/procedure(s) were performed by non-physician practitioner and as supervising physician I was immediately available for consultation/collaboration.   EKG Interpretation None        Forrest S Harrison, MD 01/29/14 1922 

## 2014-01-29 NOTE — ED Provider Notes (Signed)
CSN: [id number]     Arrival date & time 01/29/14  1255 History  This chart was scribed for non-physician practitioner, Fayrene HelperBowie Kalesha Irving, PA-C working with Junius ArgyleForrest S Harrison, MD by Greggory StallionKayla Andersen, ED scribe. This patient was seen in room TR04C/TR04C and the patient's care was started at 2:47 PM.    Chief Complaint  Patient presents with  . Asthma   The history is provided by the patient. No language interpreter was used.   HPI Comments: Keith Weber is a 43 y.o. male who presents to the Emergency Department complaining of an asthma flare up that started about one week ago. Pt is having mild SOB and productive cough. States he ran out of his inhaler a few months ago and his nebulizer this morning so he is requesting refills. He was diagnosed with COPD about 3 years ago. Pt smokes cigarettes but states he is trying to quit. Denies fever, hemoptysis, nausea, emesis, diarrhea. Pt has been hospitalized in the ICU in the past due to breathing problems.   Past Medical History  Diagnosis Date  . Asthma   . Sciatic nerve pain    History reviewed. No pertinent past surgical history. History reviewed. No pertinent family history. History  Substance Use Topics  . Smoking status: Current Some Day Smoker  . Smokeless tobacco: Not on file  . Alcohol Use: Yes     Comment: occ    Review of Systems  Constitutional: Negative for fever.  Respiratory: Positive for cough and shortness of breath.   Gastrointestinal: Negative for nausea, vomiting and diarrhea.  All other systems reviewed and are negative.  Allergies  Shellfish allergy; Amoxicillin; Iodine; and Peanut-containing drug products  Home Medications   Prior to Admission medications   Medication Sig Start Date End Date Taking? Authorizing Provider  albuterol (PROVENTIL HFA;VENTOLIN HFA) 108 (90 BASE) MCG/ACT inhaler Inhale 2 puffs into the lungs every 6 (six) hours as needed for wheezing or shortness of breath. 09/06/13   Olivia Mackielga M Otter, MD   cyclobenzaprine (FLEXERIL) 5 MG tablet Take 1 tablet (5 mg total) by mouth 3 (three) times daily as needed for muscle spasms. 10/29/13   Linna HoffJames D Kindl, MD  Fluticasone-Salmeterol (ADVAIR DISKUS) 250-50 MCG/DOSE AEPB Inhale 1 puff into the lungs 2 (two) times daily. 10/29/13   Linna HoffJames D Kindl, MD  Fluticasone-Salmeterol (ADVAIR) 250-50 MCG/DOSE AEPB Inhale 1 puff into the lungs 2 (two) times daily. 09/06/13   Olivia Mackielga M Otter, MD  HYDROcodone-acetaminophen (HYCET) 7.5-325 mg/15 ml solution Take 10 mLs by mouth 4 (four) times daily as needed for moderate pain. 01/29/14   Fayrene HelperBowie Sherise Geerdes, PA-C  methylPREDNISolone (MEDROL DOSEPAK) 4 MG tablet follow package directions, start on mon, take until finished 01/29/14   Fayrene HelperBowie Chikita Dogan, PA-C  oxyCODONE-acetaminophen (PERCOCET/ROXICET) 5-325 MG per tablet Take 1 tablet by mouth every 8 (eight) hours as needed for severe pain. 10/30/13   Jimmye Normanavid John Smith, NP  pantoprazole (PROTONIX) 20 MG tablet Take 1 tablet (20 mg total) by mouth daily. 01/29/14   Fayrene HelperBowie Danniela Mcbrearty, PA-C  predniSONE (DELTASONE) 20 MG tablet Take 3 tablets (60 mg total) by mouth daily. 09/06/13   Olivia Mackielga M Otter, MD   BP 133/98  Pulse 78  Temp(Src) 98.9 F (37.2 C) (Oral)  Resp 12  SpO2 98%  Physical Exam  Nursing note and vitals reviewed. Constitutional: He is oriented to person, place, and time. He appears well-developed and well-nourished. No distress.  HENT:  Head: Normocephalic and atraumatic.  Mouth/Throat: Uvula is midline. No oropharyngeal exudate  or posterior oropharyngeal edema.  Uvula midline. No tonsillar enlargement or exudate.   Eyes: Conjunctivae and EOM are normal.  Neck: Neck supple. No tracheal deviation present.  Cardiovascular: Normal rate, regular rhythm and normal heart sounds.   Pulmonary/Chest: Effort normal. No respiratory distress. He has wheezes. He has no rhonchi. He has no rales.  Expiratory wheezes without rales or rhonchi.   Musculoskeletal: Normal range of motion.  Neurological: He  is alert and oriented to person, place, and time.  Skin: Skin is warm and dry.  Psychiatric: He has a normal mood and affect. His behavior is normal.    ED Course  Procedures (including critical care time)  DIAGNOSTIC STUDIES: Oxygen Saturation is 98% on RA, normal by my interpretation.    COORDINATION OF CARE: 2:53 PM-Discussed xray, lab results and treatment plan which includes cough medication, a steroid and refilling medications with pt at bedside and pt agreed to plan.   2:58 PM-O2 states above 99% with ambulation.   Labs Review Labs Reviewed  BASIC METABOLIC PANEL - Abnormal; Notable for the following:    Glucose, Bld 119 (*)    GFR calc non Af Amer 63 (*)    GFR calc Af Amer 73 (*)    All other components within normal limits  CBC - Abnormal; Notable for the following:    RBC 4.20 (*)    All other components within normal limits  I-STAT TROPOININ, ED    Imaging Review Dg Chest 2 View (if Patient Has Fever And/or Copd)  01/29/2014   CLINICAL DATA:  Asthma, dental pain.  History of COPD.  EXAM: CHEST  2 VIEW  COMPARISON:  09/05/2013  FINDINGS: The heart size and mediastinal contours are within normal limits. Both lungs are clear. The visualized skeletal structures are unremarkable.  IMPRESSION: No active cardiopulmonary disease.   Electronically Signed   By: Charlett NoseKevin  Dover M.D.   On: 01/29/2014 14:31    Date: 01/29/2014  Rate: 72  Rhythm: normal sinus rhythm  QRS Axis: right  Intervals: normal  ST/T Wave abnormalities: nonspecific ST changes  Conduction Disutrbances:none  Narrative Interpretation:   Old EKG Reviewed: unchanged   EKG Interpretation None      MDM   Final diagnoses:  Asthma exacerbation attacks, mild intermittent  Medication refill    BP 133/98  Pulse 78  Temp(Src) 98.9 F (37.2 C) (Oral)  Resp 12  SpO2 98%  I have reviewed nursing notes and vital signs. I personally reviewed the imaging tests through PACS system  I reviewed available  ER/hospitalization records thought the EMR   I personally performed the services described in this documentation, which was scribed in my presence. The recorded information has been reviewed and is accurate.  Fayrene HelperBowie Chanson Teems, PA-C 01/29/14 1511

## 2014-01-29 NOTE — ED Notes (Signed)
Pt's O2 sats did not drop below 99% while ambulating.

## 2014-01-29 NOTE — ED Notes (Signed)
Pt refuses wheel chair escorted by RN to lobby entrance

## 2014-03-15 ENCOUNTER — Emergency Department (HOSPITAL_COMMUNITY)
Admission: EM | Admit: 2014-03-15 | Discharge: 2014-03-15 | Disposition: A | Discharge status: Home / Self Care | Payer: Self-pay | Attending: Emergency Medicine | Admitting: Emergency Medicine

## 2014-03-15 ENCOUNTER — Encounter (HOSPITAL_COMMUNITY): Payer: Self-pay | Admitting: Emergency Medicine

## 2014-03-15 ENCOUNTER — Emergency Department (HOSPITAL_COMMUNITY): Payer: PRIVATE HEALTH INSURANCE

## 2014-03-15 DIAGNOSIS — IMO0002 Reserved for concepts with insufficient information to code with codable children: Secondary | ICD-10-CM | POA: Insufficient documentation

## 2014-03-15 DIAGNOSIS — K219 Gastro-esophageal reflux disease without esophagitis: Secondary | ICD-10-CM | POA: Insufficient documentation

## 2014-03-15 DIAGNOSIS — M5430 Sciatica, unspecified side: Secondary | ICD-10-CM | POA: Insufficient documentation

## 2014-03-15 DIAGNOSIS — L089 Local infection of the skin and subcutaneous tissue, unspecified: Secondary | ICD-10-CM | POA: Insufficient documentation

## 2014-03-15 DIAGNOSIS — J45901 Unspecified asthma with (acute) exacerbation: Principal | ICD-10-CM

## 2014-03-15 DIAGNOSIS — J449 Chronic obstructive pulmonary disease, unspecified: Secondary | ICD-10-CM

## 2014-03-15 DIAGNOSIS — Z79899 Other long term (current) drug therapy: Secondary | ICD-10-CM | POA: Insufficient documentation

## 2014-03-15 DIAGNOSIS — F17200 Nicotine dependence, unspecified, uncomplicated: Secondary | ICD-10-CM | POA: Insufficient documentation

## 2014-03-15 DIAGNOSIS — J45909 Unspecified asthma, uncomplicated: Secondary | ICD-10-CM | POA: Insufficient documentation

## 2014-03-15 DIAGNOSIS — Z880 Allergy status to penicillin: Secondary | ICD-10-CM | POA: Insufficient documentation

## 2014-03-15 DIAGNOSIS — J441 Chronic obstructive pulmonary disease with (acute) exacerbation: Secondary | ICD-10-CM | POA: Insufficient documentation

## 2014-03-15 HISTORY — DX: Chronic obstructive pulmonary disease, unspecified: J44.9

## 2014-03-15 HISTORY — DX: Gastro-esophageal reflux disease without esophagitis: K21.9

## 2014-03-15 MED ORDER — PREDNISONE 10 MG PO TABS: 20.0000 mg | ORAL_TABLET | Freq: Every day | ORAL | Status: DC

## 2014-03-15 MED ORDER — SULFAMETHOXAZOLE-TRIMETHOPRIM 800-160 MG PO TABS: 1.0000 | ORAL_TABLET | Freq: Two times a day (BID) | ORAL | Status: DC

## 2014-03-15 MED ORDER — FLUTICASONE-SALMETEROL 250-50 MCG/DOSE IN AEPB: 1.0000 | INHALATION_SPRAY | Freq: Every day | RESPIRATORY_TRACT | Status: DC

## 2014-03-15 MED ORDER — PREDNISONE 20 MG PO TABS
60.0000 mg | ORAL_TABLET | Freq: Once | ORAL | Status: AC
  Administered 2014-03-15: 60 mg via ORAL
  Filled 2014-03-15: qty 3

## 2014-03-15 MED ORDER — ALBUTEROL SULFATE (2.5 MG/3ML) 0.083% IN NEBU
5.0000 mg | INHALATION_SOLUTION | Freq: Once | RESPIRATORY_TRACT | Status: AC
  Administered 2014-03-15: 5 mg via RESPIRATORY_TRACT
  Filled 2014-03-15: qty 6

## 2014-03-15 MED ORDER — ALBUTEROL SULFATE HFA 108 (90 BASE) MCG/ACT IN AERS
2.0000 | INHALATION_SPRAY | RESPIRATORY_TRACT | Status: DC | PRN
  Administered 2014-03-15: 2 via RESPIRATORY_TRACT
  Filled 2014-03-15: qty 6.7

## 2014-03-15 MED ORDER — ALBUTEROL SULFATE HFA 108 (90 BASE) MCG/ACT IN AERS: 1.0000 | INHALATION_SPRAY | Freq: Four times a day (QID) | RESPIRATORY_TRACT | Status: DC | PRN

## 2014-03-15 NOTE — ED Provider Notes (Signed)
CSN: [id number]     Arrival date & time 03/15/14  0920 History  This chart was scribed for Marlon Pel, PA-C, working with Elwin Mocha, MD by Leona Carry, ED Scribe. The patient was seen in [facility]. The patient's care was started at 10:36 AM.     Chief Complaint  Patient presents with  . Asthma  . Hand Pain   Patient is a 43 y.o. male presenting with asthma and hand pain. The history is provided by the patient. No language interpreter was used.  Asthma Associated symptoms include shortness of breath.  Hand Pain Associated symptoms include shortness of breath.   HPI Comments: Keith Weber is a 43 y.o. male with a history of asthma and COPD who presents to the Emergency Department complaining of asthma exacerbation. Patient reports that the SOB has since resolved with a breathing treatment. He states that he is out of albuterol, Advair, and methylprednisone at home. He is back here from [place] and does not have prescriptions for this. Requests Rx. He admits that his wheezing is not "that bad" right now but he doesn't want it to get bad.  Patient also complains of pain and swelling in his left index finger. 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 Does not hurt all the time, gets worse with palpation.  Patient does not have a PCP.  Past Medical History  Diagnosis Date  . Asthma   . Sciatic nerve pain   . COPD (chronic obstructive pulmonary disease)   . GERD (gastroesophageal reflux disease)    History reviewed. No pertinent past surgical history. History reviewed. No pertinent family history. History  Substance Use Topics  . Smoking status: Current Some Day Smoker  . Smokeless tobacco: Not on file  . Alcohol Use: Yes     Comment: occ    Review of Systems  Respiratory: Positive for shortness of breath.   Musculoskeletal: Positive for arthralgias (right index finger).  All other systems  reviewed and are negative.     Allergies  Shellfish allergy; Amoxicillin; Iodine; and Peanut-containing drug products  Home Medications   Prior to Admission medications   Medication Sig Start Date End Date Taking? Authorizing Provider  albuterol (PROVENTIL HFA;VENTOLIN HFA) 108 (90 BASE) MCG/ACT inhaler Inhale 2 puffs into the lungs every 6 (six) hours as needed for wheezing or shortness of breath. 09/06/13   Olivia Mackie, MD  albuterol (PROVENTIL HFA;VENTOLIN HFA) 108 (90 BASE) MCG/ACT inhaler Inhale 1-2 puffs into the lungs every 6 (six) hours as needed for wheezing or shortness of breath. 03/15/14   Lukis Bunt Irine Seal, PA-C  cyclobenzaprine (FLEXERIL) 5 MG tablet Take 1 tablet (5 mg total) by mouth 3 (three) times daily as needed for muscle spasms. 10/29/13   Linna Hoff, MD  Fluticasone-Salmeterol (ADVAIR DISKUS) 250-50 MCG/DOSE AEPB Inhale 1 puff into the lungs 2 (two) times daily. 10/29/13   Linna Hoff, MD  Fluticasone-Salmeterol (ADVAIR DISKUS) 250-50 MCG/DOSE AEPB Inhale 1 puff into the lungs daily. 03/15/14   Wynell Halberg Irine Seal, PA-C  Fluticasone-Salmeterol (ADVAIR) 250-50 MCG/DOSE AEPB Inhale 1 puff into the lungs 2 (two) times daily. 09/06/13   Olivia Mackie, MD  HYDROcodone-acetaminophen (HYCET) 7.5-325 mg/15 ml solution Take 10 mLs by mouth 4 (four) times daily as needed for moderate pain. 01/29/14   Fayrene Helper, PA-C  methylPREDNISolone (MEDROL DOSEPAK) 4 MG tablet follow package directions, start on mon,  take until finished 01/29/14   Fayrene HelperBowie Tran, PA-C  oxyCODONE-acetaminophen (PERCOCET/ROXICET) 5-325 MG per tablet Take 1 tablet by mouth every 8 (eight) hours as needed for severe pain. 10/30/13   Jimmye Normanavid John Smith, NP  pantoprazole (PROTONIX) 20 MG tablet Take 1 tablet (20 mg total) by mouth daily. 01/29/14   Fayrene HelperBowie Tran, PA-C  predniSONE (DELTASONE) 10 MG tablet Take 2 tablets (20 mg total) by mouth daily. 03/15/14   Mayanna Garlitz Irine SealG Kimball Appleby, PA-C  predniSONE (DELTASONE) 20 MG tablet Take 3 tablets (60  mg total) by mouth daily. 09/06/13   Olivia Mackielga M Otter, MD  sulfamethoxazole-trimethoprim (SEPTRA DS) 800-160 MG per tablet Take 1 tablet by mouth 2 (two) times daily. 03/15/14   Dorthula Matasiffany G Offie Waide, PA-C   Triage Vitals: BP 131/84  Pulse 76  Temp(Src) 97.9 F (36.6 C) (Oral)  Resp 18  SpO2 98% Physical Exam  Nursing note and vitals reviewed. Constitutional: He is oriented to person, place, and time. He appears well-developed and well-nourished. No distress.  HENT:  Head: Normocephalic and atraumatic.  Eyes: Conjunctivae and EOM are normal.  Neck: Neck supple. No tracheal deviation present.  Cardiovascular: Normal rate.   Pulmonary/Chest: Effort normal. No respiratory distress. He has wheezes. He has no rhonchi. He has no rales.  Mild expiratory wheezing without any crackles, rales, or rhonchi.  Musculoskeletal: Normal range of motion.  Index finger on right hand has a scab over the MIP joint.No crepitus or induration, or fluctuance.   Neurological: He is alert and oriented to person, place, and time.  Skin: Skin is warm and dry.  Psychiatric: He has a normal mood and affect. His behavior is normal.    ED Course  Procedures (including critical care time) DIAGNOSTIC STUDIES: Oxygen Saturation is 98% on room air, normal by my interpretation.    COORDINATION OF CARE: 10:40 AM-Discussed treatment plan which includes a right index finger x-ray, CXR, Deltasone, and Proventil  with pt at bedside and pt agreed to plan.     Labs Review Labs Reviewed - No data to display  Imaging Review Dg Chest 2 View (if Patient Has Fever And/or Copd)  03/15/2014   CLINICAL DATA:  Short of breath.  EXAM: CHEST  2 VIEW  COMPARISON:  01/29/2014  FINDINGS: The heart size and mediastinal contours are within normal limits. Both lungs are clear. The visualized skeletal structures are unremarkable.  IMPRESSION: No active cardiopulmonary disease.   Electronically Signed   By: Amie Portlandavid  Ormond M.D.   On: 03/15/2014 10:32    Dg Finger Index Right  03/15/2014   CLINICAL DATA:  Pain in the MCP joint region of the right index finger. Patient injured finger while hammering.  EXAM: RIGHT INDEX FINGER 2+V  COMPARISON:  None.  FINDINGS: No fracture or dislocation. No significant arthropathic changes. Soft tissues are unremarkable.  IMPRESSION: Negative.   Electronically Signed   By: Amie Portlandavid  Ormond M.D.   On: 03/15/2014 10:34     EKG Interpretation None      MDM   Final diagnoses:  COPD with asthma  Skin infection   Medications  albuterol (PROVENTIL) (2.5 MG/3ML) 0.083% nebulizer solution 5 mg (5 mg Nebulization Given 03/15/14 0939)  predniSONE (DELTASONE) tablet 60 mg (60 mg Oral Given 03/15/14 1059)    Patient improved significantly with breathing treatments. His finger may be developing underlying infection due to irritation, no frank abscess but will cover for abx to see if that helps.  albuterol (PROVENTIL HFA;VENTOLIN HFA) 108 (90 BASE) MCG/ACT inhaler  Inhale 1-2 puffs into the lungs every 6 (six) hours as needed for wheezing or shortness of breath. 1 Inhaler Obed Samek Irine Seal, PA-C  Fluticasone-Salmeterol (ADVAIR DISKUS) 250-50 MCG/DOSE AEPB Inhale 1 puff into the lungs daily. 60 each Bostyn Bogie Irine Seal, PA-C  predniSONE (DELTASONE) 10 MG tablet Take 2 tablets (20 mg total) by mouth daily. 21 tablet Darianny Momon Irine Seal, PA-C  sulfamethoxazole-trimethoprim (SEPTRA DS) 800-160 MG per tablet Take 1 tablet by mouth 2 (two) times daily. 10 tablet Dorthula Matas, PA-C   43 y.o.Keith Weber's evaluation in the Emergency Department is complete. It has been determined that no acute conditions requiring further emergency intervention are present at this time. The patient/guardian have been advised of the diagnosis and plan. We have discussed signs and symptoms that warrant return to the ED, such as changes or worsening in symptoms.  Vital signs are stable at discharge. Filed Vitals:   03/15/14 0926  BP: 131/84   Pulse: 76  Temp: 97.9 F (36.6 C)  Resp: 18    Patient/guardian has voiced understanding and agreed to follow-up with the PCP or specialist.  I personally performed the services described in this documentation, which was scribed in my presence. The recorded information has been reviewed and is accurate.   Dorthula Matas, PA-C 03/17/14 [phone]

## 2014-03-15 NOTE — Discharge Instructions (Signed)
Asthma °Asthma is a recurring condition in which the airways tighten and narrow. Asthma can make it difficult to breathe. It can cause coughing, wheezing, and shortness of breath. Asthma episodes, also called asthma attacks, range from minor to life-threatening. Asthma cannot be cured, but medicines and lifestyle changes can help control it. °CAUSES °Asthma is believed to be caused by inherited (genetic) and environmental factors, but its exact cause is unknown. Asthma may be triggered by allergens, lung infections, or irritants in the air. Asthma triggers are different for each person. Common triggers include:  °· Animal dander. °· Dust mites. °· Cockroaches. °· Pollen from trees or grass. °· Mold. °· Smoke. °· Air pollutants such as dust, household cleaners, hair sprays, aerosol sprays, paint fumes, strong chemicals, or strong odors. °· Cold air, weather changes, and winds (which increase molds and pollens in the air). °· Strong emotional expressions such as crying or laughing hard. °· Stress. °· Certain medicines (such as aspirin) or types of drugs (such as beta-blockers). °· Sulfites in foods and drinks. Foods and drinks that may contain sulfites include dried fruit, potato chips, and sparkling grape juice. °· Infections or inflammatory conditions such as the flu, a cold, or an inflammation of the nasal membranes (rhinitis). °· Gastroesophageal reflux disease (GERD). °· Exercise or strenuous activity. °SYMPTOMS °Symptoms may occur immediately after asthma is triggered or many hours later. Symptoms include: °· Wheezing. °· Excessive nighttime or early morning coughing. °· Frequent or severe coughing with a common cold. °· Chest tightness. °· Shortness of breath. °DIAGNOSIS  °The diagnosis of asthma is made by a review of your medical history and a physical exam. Tests may also be performed. These may include: °· Lung function studies. These tests show how much air you breathe in and out. °· Allergy  tests. °· Imaging tests such as X-rays. °TREATMENT  °Asthma cannot be cured, but it can usually be controlled. Treatment involves identifying and avoiding your asthma triggers. It also involves medicines. There are 2 classes of medicine used for asthma treatment:  °· Controller medicines. These prevent asthma symptoms from occurring. They are usually taken every day. °· Reliever or rescue medicines. These quickly relieve asthma symptoms. They are used as needed and provide short-term relief. °Your health care provider will help you create an asthma action plan. An asthma action plan is a written plan for managing and treating your asthma attacks. It includes a list of your asthma triggers and how they may be avoided. It also includes information on when medicines should be taken and when their dosage should be changed. An action plan may also involve the use of a device called a peak flow meter. A peak flow meter measures how well the lungs are working. It helps you monitor your condition. °HOME CARE INSTRUCTIONS  °· Take medicines only as directed by your health care provider. Speak with your health care provider if you have questions about how or when to take the medicines. °· Use a peak flow meter as directed by your health care provider. Record and keep track of readings. °· Understand and use the action plan to help minimize or stop an asthma attack without needing to seek medical care. °· Control your home environment in the following ways to help prevent asthma attacks: °¨ Do not smoke. Avoid being exposed to secondhand smoke. °¨ Change your heating and air conditioning filter regularly. °¨ Limit your use of fireplaces and wood stoves. °¨ Get rid of pests (such as roaches and   mice) and their droppings.  Throw away plants if you see mold on them.  Clean your floors and dust regularly. Use unscented cleaning products.  Try to have someone else vacuum for you regularly. Stay out of rooms while they are  being vacuumed and for a short while afterward. If you vacuum, use a dust mask from a hardware store, a double-layered or microfilter vacuum cleaner bag, or a vacuum cleaner with a HEPA filter.  Replace carpet with wood, tile, or vinyl flooring. Carpet can trap dander and dust.  Use allergy-proof pillows, mattress covers, and box spring covers.  Wash bed sheets and blankets every week in hot water and dry them in a dryer.  Use blankets that are made of polyester or cotton.  Clean bathrooms and kitchens with bleach. If possible, have someone repaint the walls in these rooms with mold-resistant paint. Keep out of the rooms that are being cleaned and painted.  Wash hands frequently. SEEK MEDICAL CARE IF:   You have wheezing, shortness of breath, or a cough even if taking medicine to prevent attacks.  The colored mucus you cough up (sputum) is thicker than usual.  Your sputum changes from clear or white to yellow, green, gray, or bloody.  You have any problems that may be related to the medicines you are taking (such as a rash, itching, swelling, or trouble breathing).  You are using a reliever medicine more than 2-3 times per week.  Your peak flow is still at 50-79% of your personal best after following your action plan for 1 hour.  You have a fever. SEEK IMMEDIATE MEDICAL CARE IF:   You seem to be getting worse and are unresponsive to treatment during an asthma attack.  You are short of breath even at rest.  You get short of breath when doing very little physical activity.  You have difficulty eating, drinking, or talking due to asthma symptoms.  You develop chest pain.  You develop a fast heartbeat.  You have a bluish color to your lips or fingernails.  You are light-headed, dizzy, or faint.  Your peak flow is less than 50% of your personal best. MAKE SURE YOU:   Understand these instructions.  Will watch your condition.  Will get help right away if you are not  doing well or get worse. Document Released: 07/27/2005 Document Revised: 12/11/2013 Document Reviewed: 02/23/2013 [facility] Patient Information 2015 [place]. This information is not intended to replace advice given to you by your health care provider. Make sure you discuss any questions you have with your health care provider.  Chronic Obstructive Pulmonary Disease Chronic obstructive pulmonary disease (COPD) is a common lung condition in which airflow from the lungs is limited. COPD is a general term that can be used to describe many different lung problems that limit airflow, including both chronic bronchitis and emphysema. If you have COPD, your lung function will probably never return to normal, but there are measures you can take to improve lung function and make yourself feel better.  CAUSES   Smoking (common).   Exposure to secondhand smoke.   Genetic problems.  Chronic inflammatory lung diseases or recurrent infections. SYMPTOMS   Shortness of breath, especially with physical activity.   Deep, persistent (chronic) cough with a large amount of thick mucus.   Wheezing.   Rapid breaths (tachypnea).   Gray or bluish discoloration (cyanosis) of the skin, especially in fingers, toes, or lips.   Fatigue.   Weight loss.   Frequent  infections or episodes when breathing symptoms become much worse (exacerbations).   Chest tightness. DIAGNOSIS  Your health care provider will take a medical history and perform a physical examination to make the initial diagnosis. Additional tests for COPD may include:   Lung (pulmonary) function tests.  Chest X-ray.  CT scan.  Blood tests. TREATMENT  Treatment available to help you feel better when you have COPD includes:   Inhaler and nebulizer medicines. These help manage the symptoms of COPD and make your breathing more comfortable.  Supplemental oxygen. Supplemental oxygen is only helpful if you have a low oxygen  level in your blood.   Exercise and physical activity. These are beneficial for nearly all people with COPD. Some people may also benefit from a pulmonary rehabilitation program. HOME CARE INSTRUCTIONS   Take all medicines (inhaled or pills) as directed by your health care provider.  Avoid over-the-counter medicines or cough syrups that dry up your airway (such as antihistamines) and slow down the elimination of secretions unless instructed otherwise by your health care provider.   If you are a smoker, the most important thing that you can do is stop smoking. Continuing to smoke will cause further lung damage and breathing trouble. Ask your health care provider for help with quitting smoking. He or she can direct you to community resources or hospitals that provide support.  Avoid exposure to irritants such as smoke, chemicals, and fumes that aggravate your breathing.  Use oxygen therapy and pulmonary rehabilitation if directed by your health care provider. If you require home oxygen therapy, ask your health care provider whether you should purchase a pulse oximeter to measure your oxygen level at home.   Avoid contact with individuals who have a contagious illness.  Avoid extreme temperature and humidity changes.  Eat healthy foods. Eating smaller, more frequent meals and resting before meals may help you maintain your strength.  Stay active, but balance activity with periods of rest. Exercise and physical activity will help you maintain your ability to do things you want to do.  Preventing infection and hospitalization is very important when you have COPD. Make sure to receive all the vaccines your health care provider recommends, especially the pneumococcal and influenza vaccines. Ask your health care provider whether you need a pneumonia vaccine.  Learn and use relaxation techniques to manage stress.  Learn and use controlled breathing techniques as directed by your health care  provider. Controlled breathing techniques include:   Pursed lip breathing. Start by breathing in (inhaling) through your nose for 1 second. Then, purse your lips as if you were going to whistle and breathe out (exhale) through the pursed lips for 2 seconds.   Diaphragmatic breathing. Start by putting one hand on your abdomen just above your waist. Inhale slowly through your nose. The hand on your abdomen should move out. Then purse your lips and exhale slowly. You should be able to feel the hand on your abdomen moving in as you exhale.   Learn and use controlled coughing to clear mucus from your lungs. Controlled coughing is a series of short, progressive coughs. The steps of controlled coughing are:  1. Lean your head slightly forward.  2. Breathe in deeply using diaphragmatic breathing.  3. Try to hold your breath for 3 seconds.  4. Keep your mouth slightly open while coughing twice.  5. Spit any mucus out into a tissue.  6. Rest and repeat the steps once or twice as needed. SEEK MEDICAL CARE IF:  You are coughing up more mucus than usual.   There is a change in the color or thickness of your mucus.   Your breathing is more labored than usual.   Your breathing is faster than usual.  SEEK IMMEDIATE MEDICAL CARE IF:   You have shortness of breath while you are resting.   You have shortness of breath that prevents you from:  Being able to talk.   Performing your usual physical activities.   You have chest pain lasting longer than 5 minutes.   Your skin color is more cyanotic than usual.  You measure low oxygen saturations for longer than 5 minutes with a pulse oximeter. MAKE SURE YOU:   Understand these instructions.  Will watch your condition.  Will get help right away if you are not doing well or get worse. Document Released: 05/06/2005 Document Revised: 12/11/2013 Document Reviewed: 03/23/2013 [facility] Patient Information 2015 [place], [place]. This  information is not intended to replace advice given to you by your health care provider. Make sure you discuss any questions you have with your health care provider.

## 2014-03-15 NOTE — ED Notes (Signed)
Pt c/o increased asthma; pt sts out of meds; pt speaking complete sentences and appears not distressed at present; pt sts pain at knuckle on second digit of right hand x 1 month

## 2014-03-17 NOTE — ED Provider Notes (Signed)
Medical screening examination/treatment/procedure(s) were performed by non-physician practitioner and as supervising physician I was immediately available for consultation/collaboration.   EKG Interpretation None        Elwin MochaBlair Purvi Ruehl, MD 03/17/14 [phone]

## 2014-03-21 ENCOUNTER — Emergency Department (HOSPITAL_COMMUNITY)
Admission: EM | Admit: 2014-03-21 | Discharge: 2014-03-21 | Disposition: A | Discharge status: Home / Self Care | Payer: PRIVATE HEALTH INSURANCE | Attending: Emergency Medicine | Admitting: Emergency Medicine

## 2014-03-21 ENCOUNTER — Encounter (HOSPITAL_COMMUNITY): Payer: Self-pay | Admitting: Emergency Medicine

## 2014-03-21 DIAGNOSIS — J45909 Unspecified asthma, uncomplicated: Secondary | ICD-10-CM

## 2014-03-21 DIAGNOSIS — J45901 Unspecified asthma with (acute) exacerbation: Secondary | ICD-10-CM | POA: Insufficient documentation

## 2014-03-21 MED ORDER — IPRATROPIUM-ALBUTEROL 0.5-2.5 (3) MG/3ML IN SOLN
3.0000 mL | Freq: Once | RESPIRATORY_TRACT | Status: AC
  Administered 2014-03-21: 3 mL via RESPIRATORY_TRACT
  Filled 2014-03-21: qty 3

## 2014-03-21 MED ORDER — PREDNISONE 20 MG PO TABS
60.0000 mg | ORAL_TABLET | Freq: Once | ORAL | Status: AC
  Administered 2014-03-21: 60 mg via ORAL
  Filled 2014-03-21: qty 3

## 2014-03-21 MED ORDER — ALBUTEROL SULFATE HFA 108 (90 BASE) MCG/ACT IN AERS
2.0000 | INHALATION_SPRAY | Freq: Four times a day (QID) | RESPIRATORY_TRACT | Status: DC
  Filled 2014-03-21: qty 6.7

## 2014-03-21 MED ORDER — PREDNISONE 10 MG PO TABS: 40.0000 mg | ORAL_TABLET | Freq: Every day | ORAL | Status: DC

## 2014-03-21 NOTE — Discharge Instructions (Signed)
Asthma °Asthma is a condition of the lungs in which the airways tighten and narrow. Asthma can make it hard to breathe. Asthma cannot be cured, but medicine and lifestyle changes can help control it. Asthma may be started (triggered) by: °· Animal skin flakes (dander). °· Dust. °· Cockroaches. °· Pollen. °· Mold. °· Smoke. °· Cleaning products. °· Hair sprays or aerosol sprays. °· Paint fumes or strong smells. °· Cold air, weather changes, and winds. °· Crying or laughing hard. °· Stress. °· Certain medicines or drugs. °· Foods, such as dried fruit, potato chips, and sparkling grape juice. °· Infections or conditions (colds, flu). °· Exercise. °· Certain medical conditions or diseases. °· Exercise or tiring activities. °HOME CARE  °· Take medicine as told by your doctor. °· Use a peak flow meter as told by your doctor. A peak flow meter is a tool that measures how well the lungs are working. °· Record and keep track of the peak flow meter's readings. °· Understand and use the asthma action plan. An asthma action plan is a written plan for taking care of your asthma and treating your attacks. °· To help prevent asthma attacks: °¨ Do not smoke. Stay away from secondhand smoke. °¨ Change your heating and air conditioning filter often. °¨ Limit your use of fireplaces and wood stoves. °¨ Get rid of pests (such as roaches and mice) and their droppings. °¨ Throw away plants if you see mold on them. °¨ Clean your floors. Dust regularly. Use cleaning products that do not smell. °¨ Have someone vacuum when you are not home. Use a vacuum cleaner with a HEPA filter if possible. °¨ Replace carpet with wood, tile, or vinyl flooring. Carpet can trap animal skin flakes and dust. °¨ Use allergy-proof pillows, mattress covers, and box spring covers. °¨ Wash bed sheets and blankets every week in hot water and dry them in a dryer. °¨ Use blankets that are made of polyester or cotton. °¨ Clean bathrooms and kitchens with bleach. If  possible, have someone repaint the walls in these rooms with mold-resistant paint. Keep out of the rooms that are being cleaned and painted. °¨ Wash hands often. °GET HELP IF: °· You have make a whistling sound when breaking (wheeze), have shortness of breath, or have a cough even if taking medicine to prevent attacks. °· The colored mucus you cough up (sputum) is thicker than usual. °· The colored mucus you cough up changes from clear or white to yellow, green, gray, or bloody. °· You have problems from the medicine you are taking such as: °¨ A rash. °¨ Itching. °¨ Swelling. °¨ Trouble breathing. °· You need reliever medicines more than 2-3 times a week. °· Your peak flow measurement is still at 50-79% of your personal best after following the action plan for 1 hour. °· You have a fever. °GET HELP RIGHT AWAY IF:  °· You seem to be worse and are not responding to medicine during an asthma attack. °· You are short of breath even at rest. °· You get short of breath when doing very little activity. °· You have trouble eating, drinking, or talking. °· You have chest pain. °· You have a fast heartbeat. °· Your lips or fingernails start to turn blue. °· You are light-headed, dizzy, or faint. °· Your peak flow is less than 50% of your personal best. °MAKE SURE YOU:  °· Understand these instructions. °· Will watch your condition. °· Will get help right away if you   are not doing well or get worse. Document Released: 01/13/2008 Document Revised: 12/11/2013 Document Reviewed: 02/23/2013 [facility] Patient Information 2015 [place]. This information is not intended to replace advice given to you by your health care provider. Make sure you discuss any questions you have with your health care provider.  Use albuterol inhaler 2 puffs every 6 hours. Take the prednisone as directed for the next 5 days. Resource guide provided below to help you find a regular doctor. May want to check out the wellness clinic on  [address].   Emergency Department Resource Guide 1) Find a Doctor and Pay Out of Pocket Although you won't have to find out who is covered by your insurance plan, it is a good idea to ask around and get recommendations. You will then need to call the office and see if the doctor you have chosen will accept you as a new patient and what types of options they offer for patients who are self-pay. Some doctors offer discounts or will set up payment plans for their patients who do not have insurance, but you will need to ask so you aren't surprised when you get to your appointment.  2) Contact Your Local Health Department Not all health departments have doctors that can see patients for sick visits, but many do, so it is worth a call to see if yours does. If you don't know where your local health department is, you can check in your phone book. The CDC also has a tool to help you locate your state's health department, and many state websites also have listings of all of their local health departments.  3) Find a Walk-in Clinic If your illness is not likely to be very severe or complicated, you may want to try a walk in clinic. These are popping up all over the country in pharmacies, drugstores, and shopping centers. They're usually staffed by nurse practitioners or physician assistants that have been trained to treat common illnesses and complaints. They're usually fairly quick and inexpensive. However, if you have serious medical issues or chronic medical problems, these are probably not your best option.  No Primary Care Doctor: - Call Health Connect at  [phone] - they can help you locate a primary care doctor that  accepts your insurance, provides certain services, etc. - Physician Referral Service- [phone]  Chronic Pain Problems: Organization         Address  Phone   Notes  [facility]  [phone] Patients need to be referred by their primary care doctor.    Medication Assistance: Organization         Address  Phone   Notes  [facility] [address] [place], [place] [address] [phone] --Must be a resident of [place] -- Must have NO insurance coverage whatsoever (no Medicaid/ Medicare, etc.) -- The pt. MUST have a primary care doctor that directs their care regularly and follows them in the community   [facility]  [phone]   [employer]  [phone]    Agencies that provide inexpensive medical care: Organization         Address  Phone   Notes  [facility]  [phone]   [facility]    [phone]   [facility] [address] [place], [place] [address] [phone]   [facility] [address], [place] [phone]   [facility]    [phone]   [facility]    [phone]   Community [facility] and [facility]  [address], [place] Phone:  [phone], Fax:  [phone] Hours of Operation:  9 am - 6 pm, M-F.  Also accepts Medicaid/Medicare and self-pay.  [facility]  [address], [place] Phone: [phone], Fax: [phone]. Hours of Operation:  8:30 am - 5:30 pm, M-F.  Also accepts Medicaid and self-pay.  [facility] [place] [address], [place] Phone: [phone]   [facility] [address] Natasha Bence [place], [place] [phone], Ext. [phone] Mondays & Thursdays: 7-9 AM.  First 15 patients are seen on a first come, first serve basis.    Medicaid-accepting [facility] Providers:  Organization         Address  Phone   Notes  [facility] [address], [place] [phone] Also accepts self-pay patients.  [facility] [address] Laurell Josephs [place], [place]  [phone]   [facility] [address], [place] [phone]   [facility] [address] [place] [phone]   Renaye Rakers [address], [place]   [phone] Only accepts [place] Access [place] patients after they have their name applied to their card.   Self-Pay (no insurance) in [facility]:  Organization         Address  Phone   Notes  Sickle Cell Patients, [facility] [address] [place], [place] [phone]   [facility] [address] [place], [place] [phone]   [facility] [place]  [address], [place] [phone]   [facility]/Dr. Osei-Bonsu  [address] [place] or [address], [place] [phone] Phone number for both [place] and [place] locations is the same.  [facility] [address], [place] [phone]   [facility] [address] [place] or [address] [phone]   [facility] [address], [place] [phone], phone; [phone], fax Sees patients 1st and 3rd Saturday of every month.  Must not qualify for public or private insurance (i.e. Medicaid, Medicare, [place] Health Choice, Veterans' Benefits)  Household income should be no more than 200% of the poverty level The clinic cannot treat you if you are pregnant or think you are pregnant  Sexually transmitted diseases are not treated at the clinic.    Dental Care: Organization         Address  Phone  Notes  [facility] [address] [place], [place] [phone] Accepts children up to age 44 who are enrolled in [place] or [place] Health Choice; pregnant women with a Medicaid card; and children who have applied for Medicaid or [place] Health Choice, but were declined, whose parents can pay a reduced fee at time of service.   [facility]  [address], [place] [phone] Accepts children up to age 57 who are enrolled in [place] or [place] Health Choice; pregnant women with a Medicaid card; and children who have applied for Medicaid or [place] Health Choice, but were declined, whose  parents can pay a reduced fee at time of service.  [facility]  [address] [place], [place] [phone] Patients are seen by appointment only. Walk-ins are not accepted. [facility] will see patients 37 years of age and older. Monday - Tuesday (8am-5pm) Most Wednesdays (8:30-5pm) $30 per visit, cash only  [facility] Adult Dental Access PROGRAM  [address], [facility] [phone] Patients are seen by appointment only. Walk-ins are not accepted. [facility] will see patients 24 years of age and older. One Wednesday Evening (Monthly: Volunteer Based).  $30 per visit, cash only  [employer]  [phone] for adults; Children under age 45, call Graduate Pediatric Dentistry at [phone]. Children aged 50-14, please call [phone] to request a pediatric application.  Dental services are provided in all areas of dental care including fillings, crowns and bridges, complete and partial dentures, implants, gum treatment, root canals, and extractions. Preventive care is also provided. Treatment is provided to both adults and children. Patients are selected via a lottery and there is often a waiting list.   [facility] [address] [place]  [phone] [email or web address]   [facility] [address] [place], [place] [phone], Ext. [phone] Second and Fourth Thursday of each month, opens at 6:30 AM; Clinic ends at 9 AM.  Patients are seen on a first-come first-served basis, and a limited number are seen during each clinic.   [facility]  [address] Ether Griffins [place], [place] [phone]   Eligibility Requirements You must have lived in [place], [place], or [place] counties for at least the last three months.   You cannot be eligible for state or federal sponsored [place], including [employer], [place], or [employer].   You generally cannot be eligible for healthcare insurance through your employer.    How to apply: Eligibility screenings are held every Tuesday and Wednesday afternoon from 1:00 pm until 4:00 pm. You do not need an appointment for the interview!  [facility] [address] [place], [place] [phone]   [facility] Health Department  [phone]   [facility] Health Department  [phone]   [facility] Health Department  [phone]    Behavioral Health Resources in the Community: Intensive Outpatient Programs Organization         Address  Phone  Notes  [facility] [address], [place], [place] [phone]   [facility] Outpatient [address], [place], [place] [phone]   [facility] [address] [place], [place]  [phone]   [facility] Mental Health [address]  [place], [place] [phone] or [phone]   Substance Abuse Resources Organization         Address  Phone  Notes  Alcohol [facility] Drug Services  [phone]   [facility]  [phone]   [facility]  [phone]   Floydene Flock  [phone]   Residential & Outpatient Substance Abuse Program  [phone]   Psychological Services Organization         Address  Phone  Notes  [facility] Health  [phone]   [facility] Services  [phone]   [facility] Mental Health [address], [place] [phone] or [phone]    Mobile Crisis Teams Organization         Address  Phone  Notes  [facility], Mobile Crisis Care  Unit  [phone]   [facility]  [address] [place], [place] [phone]   [facility] [address] [place] [phone]    Self-Help/Support Groups Organization         Address  Phone             Notes  [facility] [place] - variety of support groups  [phone]- I7437963 Call for more information  Narcotics Anonymous (NA), [facility] [address], [employer] [place]  2 meetings at this location   Statistician         Address  Phone  Notes  [facility] [address],    [place]  [phone]   [facility]  [address], [place] [address], [place], [place] [phone]   [facility] [address] [place], [place] [phone] Admissions: 8am-3pm M-F  [facility] [address],    [place], [place] [phone]   [facility] [address] [place], [place], [place] [phone]   [facility] [address],  [place], [place] [phone]   [facility] - Intensive Outpatient [address] Laurell Josephs [address], [place], [place] [phone]   [facility] ([facility]iction Recovery [facility] [address] [place]  [place], [place] [phone] or [phone]   [facility] ([facility]) [address] [place], [place] [phone] Accepts Medicaid  [facility] [place] [address]  [place] [phone] Substance Abuse/Addiction Treatment   [facility] Organization         Address  Phone  Notes  [facility]  [phone]   Angie Fava, PhD [address] Ervin Knack [place], [place]   [phone] or [phone]   [facility] Behavioral   [address] [place], [place] [phone]   [facility] [address], [place], [place] [phone] Insurance/Medicaid/sponsorship through [facility] and Families [address]                                    [place], [place] [phone]  Therapy/tele-psych/case  [facility] [address] [place], [place] [phone]    Dr. Lolly Mustache  [phone]   [facility]  [employer] [facility] Dept. 1) [address] [place] 2) [address], [place] 3)  [address], [place] [phone]  [phone]   [facility] Child Abuse Hotline [phone] or [phone] (After Hours)

## 2014-03-21 NOTE — ED Notes (Addendum)
Pt refused labs.  Pt states he is also going to refuse CXR.  Pt states he does not have insurance.  He has not been able to get Rx filled.  He does not have an inhaler at home.  Pt states he was here several days ago and just wants the results from that visit.  Dr. Deretha EmoryZackowski notified.

## 2014-03-21 NOTE — ED Notes (Signed)
Pt arrives via EMS with c/o asthma about 40 minutes ago, bilateral wheezing inspiratory and expiratory. Duoneb en route per EMS. Had asthma attack earlier today per EMS, txed on scene. 10 mg albuterol, .5mg  atrovent, 125mg  solumedrol. 18 G L AC.

## 2014-03-26 ENCOUNTER — Encounter (HOSPITAL_COMMUNITY): Payer: Self-pay | Admitting: Emergency Medicine

## 2014-03-26 ENCOUNTER — Emergency Department (HOSPITAL_COMMUNITY)
Admission: EM | Admit: 2014-03-26 | Discharge: 2014-03-26 | Disposition: A | Discharge status: Home / Self Care | Payer: PRIVATE HEALTH INSURANCE | Attending: Emergency Medicine | Admitting: Emergency Medicine

## 2014-03-26 DIAGNOSIS — Z79899 Other long term (current) drug therapy: Secondary | ICD-10-CM | POA: Insufficient documentation

## 2014-03-26 DIAGNOSIS — J45901 Unspecified asthma with (acute) exacerbation: Principal | ICD-10-CM

## 2014-03-26 DIAGNOSIS — F17200 Nicotine dependence, unspecified, uncomplicated: Secondary | ICD-10-CM | POA: Insufficient documentation

## 2014-03-26 DIAGNOSIS — Z792 Long term (current) use of antibiotics: Secondary | ICD-10-CM | POA: Insufficient documentation

## 2014-03-26 DIAGNOSIS — Z8739 Personal history of other diseases of the musculoskeletal system and connective tissue: Secondary | ICD-10-CM | POA: Insufficient documentation

## 2014-03-26 DIAGNOSIS — Z880 Allergy status to penicillin: Secondary | ICD-10-CM | POA: Insufficient documentation

## 2014-03-26 DIAGNOSIS — J45909 Unspecified asthma, uncomplicated: Secondary | ICD-10-CM | POA: Insufficient documentation

## 2014-03-26 DIAGNOSIS — K219 Gastro-esophageal reflux disease without esophagitis: Secondary | ICD-10-CM | POA: Insufficient documentation

## 2014-03-26 DIAGNOSIS — IMO0002 Reserved for concepts with insufficient information to code with codable children: Secondary | ICD-10-CM | POA: Insufficient documentation

## 2014-03-26 DIAGNOSIS — J441 Chronic obstructive pulmonary disease with (acute) exacerbation: Secondary | ICD-10-CM | POA: Insufficient documentation

## 2014-03-26 MED ORDER — ALBUTEROL SULFATE HFA 108 (90 BASE) MCG/ACT IN AERS: 1.0000 | INHALATION_SPRAY | Freq: Four times a day (QID) | RESPIRATORY_TRACT | Status: DC | PRN

## 2014-03-26 MED ORDER — IPRATROPIUM-ALBUTEROL 0.5-2.5 (3) MG/3ML IN SOLN
3.0000 mL | Freq: Once | RESPIRATORY_TRACT | Status: AC
  Administered 2014-03-26: 3 mL via RESPIRATORY_TRACT
  Filled 2014-03-26: qty 3

## 2014-03-26 NOTE — ED Notes (Signed)
Seen recently in ED for asthma exacerbation and was given steroids and an inhaler. States ran out of inhaler and developed wheezing today. EMS administered albuterol 5mg  states feels better.

## 2014-03-26 NOTE — ED Provider Notes (Signed)
CSN: [id number]     Arrival date & time 03/26/14  4098 History   First MD Initiated Contact with Patient 03/26/14 [id number]     Chief Complaint  Patient presents with  . Asthma     (Consider location/radiation/quality/duration/timing/severity/associated sxs/prior Treatment) The history is provided by the patient and medical records.   This is a 43 year old male with past medical history significant for asthma, COPD, presented to the ED for shortness of breath.  This is patient's third ED visit this month for asthma exacerbation. He was seen recently given an albuterol inhaler and a course of prednisone. He states his symptoms did improve, but returned early this morning and woke him from sleep. He states the symptoms are typical of his usual asthma exacerbation. He states that humidity is a known trigger for his asthma has gotten worse since moving to [place]. He denies any chest pain. States a slight nonproductive cough occurring with his wheezing.  He denies any fever, chills, or sweats. No known sick contacts.  Patient given neb en route with EMS, states he is feeling better but still feels somewhat wheezy.  VS stable on arrival.  Past Medical History  Diagnosis Date  . Asthma   . Sciatic nerve pain   . COPD (chronic obstructive pulmonary disease)   . GERD (gastroesophageal reflux disease)    No past surgical history on file. No family history on file. History  Substance Use Topics  . Smoking status: Current Some Day Smoker  . Smokeless tobacco: Not on file  . Alcohol Use: Yes     Comment: occ    Review of Systems  Respiratory: Positive for shortness of breath and wheezing.   All other systems reviewed and are negative.     Allergies  Shellfish allergy; Amoxicillin; Iodine; and Peanut-containing drug products  Home Medications   Prior to Admission medications   Medication Sig Start Date End Date Taking? Authorizing Provider  albuterol (PROVENTIL  HFA;VENTOLIN HFA) 108 (90 BASE) MCG/ACT inhaler Inhale 2 puffs into the lungs every 6 (six) hours as needed for wheezing or shortness of breath. 09/06/13   Olivia Mackie, MD  cyclobenzaprine (FLEXERIL) 5 MG tablet Take 1 tablet (5 mg total) by mouth 3 (three) times daily as needed for muscle spasms. 10/29/13   Linna Hoff, MD  Fluticasone-Salmeterol (ADVAIR DISKUS) 250-50 MCG/DOSE AEPB Inhale 1 puff into the lungs daily. 03/15/14   Tiffany Irine Seal, PA-C  Fluticasone-Salmeterol (ADVAIR) 250-50 MCG/DOSE AEPB Inhale 1 puff into the lungs 2 (two) times daily. 09/06/13   Olivia Mackie, MD  HYDROcodone-acetaminophen (HYCET) 7.5-325 mg/15 ml solution Take 10 mLs by mouth 4 (four) times daily as needed for moderate pain. 01/29/14   Fayrene Helper, PA-C  methylPREDNISolone (MEDROL DOSEPAK) 4 MG tablet follow package directions, start on mon, take until finished 01/29/14   Fayrene Helper, PA-C  oxyCODONE-acetaminophen (PERCOCET/ROXICET) 5-325 MG per tablet Take 1 tablet by mouth every 8 (eight) hours as needed for severe pain. 10/30/13   Jimmye Norman, NP  pantoprazole (PROTONIX) 20 MG tablet Take 1 tablet (20 mg total) by mouth daily. 01/29/14   Fayrene Helper, PA-C  predniSONE (DELTASONE) 10 MG tablet Take 2 tablets (20 mg total) by mouth daily. 03/15/14   Tiffany Irine Seal, PA-C  predniSONE (DELTASONE) 10 MG tablet Take 4 tablets (40 mg total) by mouth daily. 03/21/14   Vanetta Mulders, MD  sulfamethoxazole-trimethoprim (SEPTRA DS) 800-160 MG per tablet Take 1 tablet by mouth 2 (two)  times daily. 03/15/14   Tiffany Irine SealG Greene, PA-C   BP 155/85  Pulse 98  Temp(Src) 97.3 F (36.3 C) (Oral)  Resp 18  Ht 5\' 11"  (1.803 m)  Wt 165 lb (74.844 kg)  BMI 23.02 kg/m2  SpO2 98%  Physical Exam  Nursing note and vitals reviewed. Constitutional: He is oriented to person, place, and time. He appears well-developed and well-nourished. No distress.  HENT:  Head: Normocephalic and atraumatic.  Mouth/Throat: Oropharynx is clear and moist.   Eyes: Conjunctivae and EOM are normal. Pupils are equal, round, and reactive to light.  Neck: Normal range of motion. Neck supple.  Cardiovascular: Normal rate, regular rhythm and normal heart sounds.   Pulmonary/Chest: Effort normal. No respiratory distress. He has wheezes.  Respirations unlabored, diffuse expiratory wheezes noted, speaking in full complete sentences without difficulty  Musculoskeletal: Normal range of motion.  Neurological: He is alert and oriented to person, place, and time.  Skin: Skin is warm and dry. He is not diaphoretic.  Psychiatric: He has a normal mood and affect.    ED Course  Procedures (including critical care time) Labs Review Labs Reviewed - No data to display  Imaging Review No results found.   EKG Interpretation None      MDM   Final diagnoses:  Asthma, unspecified asthma severity, uncomplicated   43 y.o. M with hx of asthma and recurrent ED visits for albuterol inhalers.  Given neb tx en route with some improvement of sx.  On exam patient in no current respiratory distress, does have some expiratory wheezes.  Given second neb with significant improvement, O2 sats remain stable on RA.  I have asked case management to speak with patient regarding his frequent ED visits-- will arrange for clinic FU so he may refill his albuterol there as opposed to returning to ED, pt agreed that he will FU today  Discussed plan with patient, he/she acknowledged understanding and agreed with plan of care.  Return precautions given for new or worsening symptoms.  Garlon HatchetLisa M Manual Navarra, PA-C 03/26/14 1019

## 2014-03-26 NOTE — Discharge Instructions (Signed)
Take the prescribed medication as directed. Follow-up with the clinic today as discussed with Ms. Marylene LandAngela. Return to the ED for new or worsening symptoms.

## 2014-03-26 NOTE — ED Provider Notes (Signed)
  This was a shared visit with a mid-level provided (NP or PA).  Throughout the patient's course I was available for consultation/collaboration.  I saw the ECG (if appropriate), relevant labs and studies - I agree with the interpretation.  On my exam the patient was in no distress.  He was feeling better, and we arranged for discussion with case management to assist with better outpatient management of the patient's disease      Gerhard Munchobert Chidi Shirer, MD 03/26/14 1210

## 2014-03-26 NOTE — Progress Notes (Signed)
  CARE MANAGEMENT ED NOTE 03/26/2014  Patient:  Selden,Lenis   Account Number:  [id number]  Date Initiated:  03/26/2014  Documentation initiated by:  Ferdinand CavaSCHETTINO,Tiyonna Sardinha  Subjective/Objective Assessment:   43 yo male presenting to the ED with C/o SOB and wheezing     Subjective/Objective Assessment Detail:     Action/Plan:   [facility] and get ED prescriptions filled.   Action/Plan Detail:   Anticipated DC Date:       Status Recommendation to Physician:   Result of Recommendation:  Agreed    DC Planning Services  CM consult  PCP issues  Other    Choice offered to / List presented to:  C-1 Patient          Status of service:  Completed, signed off  ED Comments:   ED Comments Detail:  CM consulted to assist with medications. This RN spoke with the patient regarding his ED visit and he stated that he does not have a PCP or insurance and when he runs out of his inhaler he comes to the ED. This CM spoke with the patient regarding the importance of a PCP for continued care and follow up care. Provided the patient with a list of [facility] resources and provided the patient with a pamphlet for [facility] and instructed the [facility] and make an doctor appointment and then he can fill his discharge prescriptions there. Also encouraged the patient to make an appointment to apply for an orange card. This CM also provided the patient with this CM contact information so if he has any problems then he can contact this CM for further assistance. Patient provided the resources, verbalized understanding and had no other questions or concerns. ED PA made aware of information provided to the patient.

## 2014-05-09 NOTE — ED Provider Notes (Signed)
CSN: [id number]     Arrival date & time 03/21/14  0503 History   First MD Initiated Contact with Patient 03/21/14 0540     Chief Complaint  Patient presents with  . Asthma     (Consider location/radiation/quality/duration/timing/severity/associated sxs/prior Treatment) Patient is a 43 y.o. male presenting with asthma. The history is provided by the patient and the EMS personnel.  Asthma Associated symptoms include shortness of breath. Pertinent negatives include no chest pain, no abdominal pain and no headaches.  Patient arrives by EMS with C/O asthma onset about 40 minutes ago, bilateral wheezing. EMS gave Duoneb en route. Patient had asthma attack earlier today and EMS gave treatment at home. Patient given solumedrol 125 mg. Patient seen in ED 8/6 for same. Patient's albuterol running low.   Past Medical History  Diagnosis Date  . Asthma   . Sciatic nerve pain   . COPD (chronic obstructive pulmonary disease)   . GERD (gastroesophageal reflux disease)    History reviewed. No pertinent past surgical history. No family history on file. History  Substance Use Topics  . Smoking status: Current Some Day Smoker  . Smokeless tobacco: Not on file  . Alcohol Use: Yes     Comment: occ    Review of Systems  Constitutional: Negative for fever.  HENT: Negative for congestion.   Eyes: Negative for redness.  Respiratory: Positive for shortness of breath and wheezing.   Cardiovascular: Negative for chest pain.  Gastrointestinal: Negative for nausea, vomiting and abdominal pain.  Genitourinary: Negative for dysuria.  Musculoskeletal: Negative for back pain.  Skin: Negative for rash.  Neurological: Negative for headaches.  Hematological: Does not bruise/bleed easily.  Psychiatric/Behavioral: Negative for confusion.      Allergies  Shellfish allergy; Amoxicillin; Iodine; and Peanut-containing drug products  Home Medications   Prior to Admission medications   Medication Sig Start  Date End Date Taking? Authorizing Provider  albuterol (PROVENTIL HFA;VENTOLIN HFA) 108 (90 BASE) MCG/ACT inhaler Inhale 1-2 puffs into the lungs every 6 (six) hours as needed for wheezing. 03/26/14   Garlon HatchetLisa M Sanders, PA-C  albuterol (PROVENTIL) (2.5 MG/3ML) 0.083% nebulizer solution Take 2.5 mg by nebulization every 6 (six) hours as needed for wheezing or shortness of breath.    Historical Provider, MD  Fluticasone-Salmeterol (ADVAIR DISKUS) 250-50 MCG/DOSE AEPB Inhale 1 puff into the lungs daily. 03/15/14   Tiffany Irine SealG Greene, PA-C  predniSONE (DELTASONE) 10 MG tablet Take 4 tablets (40 mg total) by mouth daily. 03/21/14   Vanetta MuldersScott Julann Mcgilvray, MD  ranitidine (ZANTAC) 75 MG tablet Take 75 mg by mouth 2 (two) times daily.    Historical Provider, MD  sulfamethoxazole-trimethoprim (SEPTRA DS) 800-160 MG per tablet Take 1 tablet by mouth 2 (two) times daily. 03/15/14   Tiffany Irine SealG Greene, PA-C   BP 147/84  Pulse 81  Temp(Src) 97.9 F (36.6 C) (Oral)  Resp 18  Ht 5\' 11"  (1.803 m)  Wt 160 lb (72.576 kg)  BMI 22.33 kg/m2  SpO2 100% Physical Exam  Nursing note and vitals reviewed. Constitutional: He is oriented to person, place, and time. He appears well-developed and well-nourished. No distress.  HENT:  Head: Normocephalic and atraumatic.  Mouth/Throat: Oropharynx is clear and moist.  Eyes: Conjunctivae and EOM are normal. Pupils are equal, round, and reactive to light.  Neck: Normal range of motion.  Cardiovascular: Normal rate, regular rhythm and normal heart sounds.   Pulmonary/Chest: Effort normal. No respiratory distress. He has wheezes.  Abdominal: Soft. Bowel sounds are normal. There is  no tenderness.  Musculoskeletal: Normal range of motion.  Neurological: He is alert and oriented to person, place, and time. No cranial nerve deficit. He exhibits normal muscle tone. Coordination normal.  Skin: Skin is warm. No rash noted.    ED Course  Procedures (including critical care time) Labs Review Labs  Reviewed - No data to display Results for orders placed during the hospital encounter of 01/29/14  BASIC METABOLIC PANEL      Result Value Ref Range   Sodium 143  137 - 147 mEq/L   Potassium 4.1  3.7 - 5.3 mEq/L   Chloride 108  96 - 112 mEq/L   CO2 23  19 - 32 mEq/L   Glucose, Bld 119 (*) 70 - 99 mg/dL   BUN 11  6 - 23 mg/dL   Creatinine, Ser 1.61  0.50 - 1.35 mg/dL   Calcium 8.7  8.4 - 09.6 mg/dL   GFR calc non Af Amer 63 (*) >90 mL/min   GFR calc Af Amer 73 (*) >90 mL/min  CBC      Result Value Ref Range   WBC 4.6  4.0 - 10.5 K/uL   RBC 4.20 (*) 4.22 - 5.81 MIL/uL   Hemoglobin 14.0  13.0 - 17.0 g/dL   HCT 04.5  40.9 - 81.1 %   MCV 96.2  78.0 - 100.0 fL   MCH 33.3  26.0 - 34.0 pg   MCHC 34.7  30.0 - 36.0 g/dL   RDW 91.4  78.2 - 95.6 %   Platelets 266  150 - 400 K/uL  I-STAT TROPOININ, ED      Result Value Ref Range   Troponin i, poc 0.02  0.00 - 0.08 ng/mL   Comment 3            No results found.   Imaging Review No results found.   EKG Interpretation   Date/Time:  Wednesday March 21 2014 05:15:09 EDT Ventricular Rate:  85 PR Interval:  166 QRS Duration: 79 QT Interval:  387 QTC Calculation: 460 R Axis:   88 Text Interpretation:  Sinus rhythm Probable left ventricular hypertrophy  ST elevation suggests acute pericarditis ED PHYSICIAN INTERPRETATION  AVAILABLE IN [facility] Confirmed by TEST, Record ([id number]) on  03/23/2014 7:23:13 AM      MDM   Final diagnoses:  Asthma, unspecified asthma severity, uncomplicated    Patient with exacerbation of asthma. Improved with EMS and ED treatments. Will be continued on prednisone and albuterol.     Vanetta Mulders, MD 05/09/14 1248

## 2014-06-16 ENCOUNTER — Encounter (HOSPITAL_COMMUNITY)
Admission: EM | Disposition: A | Discharge status: Home / Self Care | Payer: Self-pay | Source: Home / Self Care | Attending: Orthopedic Surgery

## 2014-06-16 ENCOUNTER — Emergency Department (HOSPITAL_COMMUNITY): Payer: Self-pay | Admitting: Anesthesiology

## 2014-06-16 ENCOUNTER — Emergency Department (HOSPITAL_COMMUNITY): Payer: PRIVATE HEALTH INSURANCE | Admitting: Anesthesiology

## 2014-06-16 ENCOUNTER — Inpatient Hospital Stay (HOSPITAL_COMMUNITY)
Admission: EM | Admit: 2014-06-16 | Discharge: 2014-06-18 | DRG: 580 | Disposition: A | Discharge status: Home / Self Care | Payer: Self-pay | Attending: Orthopedic Surgery | Admitting: Orthopedic Surgery

## 2014-06-16 ENCOUNTER — Encounter (HOSPITAL_COMMUNITY): Payer: Self-pay | Admitting: *Deleted

## 2014-06-16 ENCOUNTER — Emergency Department (HOSPITAL_COMMUNITY): Payer: Self-pay

## 2014-06-16 DIAGNOSIS — J449 Chronic obstructive pulmonary disease, unspecified: Secondary | ICD-10-CM | POA: Diagnosis present

## 2014-06-16 DIAGNOSIS — S62231A Other displaced fracture of base of first metacarpal bone, right hand, initial encounter for closed fracture: Secondary | ICD-10-CM | POA: Diagnosis present

## 2014-06-16 DIAGNOSIS — Z7951 Long term (current) use of inhaled steroids: Secondary | ICD-10-CM

## 2014-06-16 DIAGNOSIS — J45909 Unspecified asthma, uncomplicated: Secondary | ICD-10-CM | POA: Diagnosis present

## 2014-06-16 DIAGNOSIS — S61551A Open bite of right wrist, initial encounter: Principal | ICD-10-CM | POA: Diagnosis present

## 2014-06-16 DIAGNOSIS — S62501A Fracture of unspecified phalanx of right thumb, initial encounter for closed fracture: Secondary | ICD-10-CM | POA: Diagnosis present

## 2014-06-16 DIAGNOSIS — S81051A Open bite, right knee, initial encounter: Secondary | ICD-10-CM | POA: Diagnosis present

## 2014-06-16 DIAGNOSIS — W540XXA Bitten by dog, initial encounter: Secondary | ICD-10-CM | POA: Diagnosis present

## 2014-06-16 DIAGNOSIS — S71151A Open bite, right thigh, initial encounter: Secondary | ICD-10-CM | POA: Diagnosis present

## 2014-06-16 DIAGNOSIS — F17210 Nicotine dependence, cigarettes, uncomplicated: Secondary | ICD-10-CM | POA: Diagnosis present

## 2014-06-16 DIAGNOSIS — S61451A Open bite of right hand, initial encounter: Secondary | ICD-10-CM | POA: Diagnosis present

## 2014-06-16 DIAGNOSIS — S62521B Displaced fracture of distal phalanx of right thumb, initial encounter for open fracture: Secondary | ICD-10-CM | POA: Diagnosis present

## 2014-06-16 HISTORY — PX: IRRIGATION AND DEBRIDEMENT KNEE: SHX5185

## 2014-06-16 HISTORY — PX: I & D EXTREMITY: SHX5045

## 2014-06-16 HISTORY — PX: LACERATION REPAIR: SHX5284

## 2014-06-16 LAB — CBC WITH DIFFERENTIAL/PLATELET
Basophils Absolute: 0.1 10*3/uL (ref 0.0–0.1)
Basophils Relative: 1 % (ref 0–1)
EOS PCT: 5 % (ref 0–5)
Eosinophils Absolute: 0.5 10*3/uL (ref 0.0–0.7)
HCT: 43.5 % (ref 39.0–52.0)
Hemoglobin: 15.6 g/dL (ref 13.0–17.0)
LYMPHS ABS: 1.7 10*3/uL (ref 0.7–4.0)
LYMPHS PCT: 18 % (ref 12–46)
MCH: 33.2 pg (ref 26.0–34.0)
MCHC: 35.9 g/dL (ref 30.0–36.0)
MCV: 92.6 fL (ref 78.0–100.0)
Monocytes Absolute: 0.9 10*3/uL (ref 0.1–1.0)
Monocytes Relative: 10 % (ref 3–12)
NEUTROS ABS: 6.3 10*3/uL (ref 1.7–7.7)
Neutrophils Relative %: 66 % (ref 43–77)
PLATELETS: 262 10*3/uL (ref 150–400)
RBC: 4.7 MIL/uL (ref 4.22–5.81)
RDW: 14.7 % (ref 11.5–15.5)
WBC: 9.4 10*3/uL (ref 4.0–10.5)

## 2014-06-16 LAB — BASIC METABOLIC PANEL
ANION GAP: 11 (ref 5–15)
BUN: 12 mg/dL (ref 6–23)
CHLORIDE: 104 meq/L (ref 96–112)
CO2: 26 meq/L (ref 19–32)
Calcium: 8.4 mg/dL (ref 8.4–10.5)
Creatinine, Ser: 1.86 mg/dL — ABNORMAL HIGH (ref 0.50–1.35)
GFR calc Af Amer: 49 mL/min — ABNORMAL LOW (ref >90)
GFR calc non Af Amer: 42 mL/min — ABNORMAL LOW (ref >90)
GLUCOSE: 85 mg/dL (ref 70–99)
POTASSIUM: 4.3 meq/L (ref 3.7–5.3)
SODIUM: 141 meq/L (ref 137–147)

## 2014-06-16 LAB — SEDIMENTATION RATE: Sed Rate: 1 mm/hr (ref 0–16)

## 2014-06-16 SURGERY — IRRIGATION AND DEBRIDEMENT EXTREMITY
Anesthesia: General | Site: Thumb | Laterality: Right

## 2014-06-16 MED ORDER — SODIUM CHLORIDE 0.9 % IR SOLN
Status: DC | PRN
  Administered 2014-06-16 (×2): 3000 mL

## 2014-06-16 MED ORDER — HYDROMORPHONE HCL 1 MG/ML IJ SOLN
INTRAMUSCULAR | Status: AC
  Administered 2014-06-16: 0.5 mg via INTRAVENOUS
  Filled 2014-06-16: qty 1

## 2014-06-16 MED ORDER — CIPROFLOXACIN IN D5W 400 MG/200ML IV SOLN
400.0000 mg | Freq: Once | INTRAVENOUS | Status: AC
  Administered 2014-06-16: 400 mg via INTRAVENOUS
  Filled 2014-06-16: qty 200

## 2014-06-16 MED ORDER — FENTANYL CITRATE 0.05 MG/ML IJ SOLN
INTRAMUSCULAR | Status: DC | PRN
  Administered 2014-06-16 (×3): 50 ug via INTRAVENOUS
  Administered 2014-06-16: 100 ug via INTRAVENOUS

## 2014-06-16 MED ORDER — ALBUTEROL SULFATE HFA 108 (90 BASE) MCG/ACT IN AERS: 1.0000 | INHALATION_SPRAY | Freq: Four times a day (QID) | RESPIRATORY_TRACT | Status: DC | PRN

## 2014-06-16 MED ORDER — ONDANSETRON HCL 4 MG/2ML IJ SOLN: 4.0000 mg | Freq: Four times a day (QID) | INTRAMUSCULAR | Status: DC | PRN

## 2014-06-16 MED ORDER — OXYCODONE HCL 5 MG PO TABS
5.0000 mg | ORAL_TABLET | Freq: Once | ORAL | Status: AC | PRN
  Administered 2014-06-16: 5 mg via ORAL

## 2014-06-16 MED ORDER — TETANUS-DIPHTH-ACELL PERTUSSIS 5-2.5-18.5 LF-MCG/0.5 IM SUSP
0.5000 mL | Freq: Once | INTRAMUSCULAR | Status: AC
  Administered 2014-06-16: 0.5 mL via INTRAMUSCULAR
  Filled 2014-06-16: qty 0.5

## 2014-06-16 MED ORDER — ONDANSETRON HCL 4 MG/2ML IJ SOLN
INTRAMUSCULAR | Status: DC | PRN
  Administered 2014-06-16: 4 mg via INTRAVENOUS

## 2014-06-16 MED ORDER — MIDAZOLAM HCL 5 MG/5ML IJ SOLN
INTRAMUSCULAR | Status: DC | PRN
  Administered 2014-06-16 (×2): 1 mg via INTRAVENOUS

## 2014-06-16 MED ORDER — CLINDAMYCIN PHOSPHATE 600 MG/50ML IV SOLN
600.0000 mg | Freq: Three times a day (TID) | INTRAVENOUS | Status: DC
  Administered 2014-06-16 – 2014-06-18 (×7): 600 mg via INTRAVENOUS
  Filled 2014-06-16 (×8): qty 50

## 2014-06-16 MED ORDER — INFLUENZA VAC SPLIT QUAD 0.5 ML IM SUSY
0.5000 mL | PREFILLED_SYRINGE | INTRAMUSCULAR | Status: DC
  Filled 2014-06-16: qty 0.5

## 2014-06-16 MED ORDER — BUPIVACAINE HCL (PF) 0.25 % IJ SOLN
INTRAMUSCULAR | Status: AC
  Filled 2014-06-16: qty 30

## 2014-06-16 MED ORDER — LIDOCAINE HCL (CARDIAC) 20 MG/ML IV SOLN
INTRAVENOUS | Status: DC | PRN
  Administered 2014-06-16: 70 mg via INTRAVENOUS

## 2014-06-16 MED ORDER — LACTATED RINGERS IV SOLN
INTRAVENOUS | Status: DC | PRN
  Administered 2014-06-16: 13:00:00 via INTRAVENOUS

## 2014-06-16 MED ORDER — PROPOFOL 10 MG/ML IV BOLUS
INTRAVENOUS | Status: DC | PRN
  Administered 2014-06-16: 180 mg via INTRAVENOUS
  Administered 2014-06-16: 20 mg via INTRAVENOUS

## 2014-06-16 MED ORDER — MIDAZOLAM HCL 2 MG/2ML IJ SOLN
INTRAMUSCULAR | Status: AC
  Filled 2014-06-16: qty 2

## 2014-06-16 MED ORDER — CIPROFLOXACIN IN D5W 400 MG/200ML IV SOLN
400.0000 mg | INTRAVENOUS | Status: DC
  Administered 2014-06-17 – 2014-06-18 (×2): 400 mg via INTRAVENOUS
  Filled 2014-06-16 (×2): qty 200

## 2014-06-16 MED ORDER — ALPRAZOLAM 0.5 MG PO TABS
0.5000 mg | ORAL_TABLET | Freq: Four times a day (QID) | ORAL | Status: DC | PRN
Start: 2014-06-16 — End: 2014-06-18

## 2014-06-16 MED ORDER — ONDANSETRON HCL 4 MG/2ML IJ SOLN
INTRAMUSCULAR | Status: AC
  Filled 2014-06-16: qty 2

## 2014-06-16 MED ORDER — FENTANYL CITRATE 0.05 MG/ML IJ SOLN
INTRAMUSCULAR | Status: AC
  Filled 2014-06-16: qty 5

## 2014-06-16 MED ORDER — MENTHOL 3 MG MT LOZG
1.0000 | LOZENGE | OROMUCOSAL | Status: DC | PRN
  Administered 2014-06-16: 3 mg via ORAL
  Filled 2014-06-16 (×2): qty 9

## 2014-06-16 MED ORDER — METHOCARBAMOL 500 MG PO TABS
500.0000 mg | ORAL_TABLET | Freq: Four times a day (QID) | ORAL | Status: DC | PRN
  Administered 2014-06-17 – 2014-06-18 (×5): 500 mg via ORAL
  Filled 2014-06-16 (×5): qty 1

## 2014-06-16 MED ORDER — EPHEDRINE SULFATE 50 MG/ML IJ SOLN
INTRAMUSCULAR | Status: DC | PRN
  Administered 2014-06-16 (×2): 10 mg via INTRAVENOUS

## 2014-06-16 MED ORDER — ONDANSETRON HCL 4 MG PO TABS: 4.0000 mg | ORAL_TABLET | Freq: Four times a day (QID) | ORAL | Status: DC | PRN

## 2014-06-16 MED ORDER — SODIUM CHLORIDE 0.45 % IV SOLN
INTRAVENOUS | Status: DC
  Administered 2014-06-16 – 2014-06-17 (×2): via INTRAVENOUS

## 2014-06-16 MED ORDER — DIPHENHYDRAMINE HCL 25 MG PO CAPS
25.0000 mg | ORAL_CAPSULE | Freq: Four times a day (QID) | ORAL | Status: DC | PRN
  Administered 2014-06-17 – 2014-06-18 (×4): 25 mg via ORAL
  Filled 2014-06-16 (×4): qty 1

## 2014-06-16 MED ORDER — HYDROMORPHONE HCL 1 MG/ML IJ SOLN
0.2500 mg | INTRAMUSCULAR | Status: DC | PRN
  Administered 2014-06-16 (×4): 0.5 mg via INTRAVENOUS

## 2014-06-16 MED ORDER — OXYCODONE HCL 5 MG PO TABS
5.0000 mg | ORAL_TABLET | ORAL | Status: DC | PRN
  Administered 2014-06-16 – 2014-06-18 (×10): 10 mg via ORAL
  Filled 2014-06-16 (×10): qty 2

## 2014-06-16 MED ORDER — OXYCODONE HCL 5 MG PO TABS
ORAL_TABLET | ORAL | Status: AC
  Administered 2014-06-16: 5 mg via ORAL
  Filled 2014-06-16: qty 1

## 2014-06-16 MED ORDER — PROMETHAZINE HCL 25 MG RE SUPP: 12.5000 mg | Freq: Four times a day (QID) | RECTAL | Status: DC | PRN

## 2014-06-16 MED ORDER — SUCCINYLCHOLINE CHLORIDE 20 MG/ML IJ SOLN
INTRAMUSCULAR | Status: DC | PRN
  Administered 2014-06-16: 110 mg via INTRAVENOUS

## 2014-06-16 MED ORDER — ALBUTEROL SULFATE (2.5 MG/3ML) 0.083% IN NEBU: 2.5000 mg | INHALATION_SOLUTION | Freq: Four times a day (QID) | RESPIRATORY_TRACT | Status: DC | PRN

## 2014-06-16 MED ORDER — PROMETHAZINE HCL 25 MG/ML IJ SOLN: 6.2500 mg | INTRAMUSCULAR | Status: DC | PRN

## 2014-06-16 MED ORDER — SENNA 8.6 MG PO TABS
1.0000 | ORAL_TABLET | Freq: Two times a day (BID) | ORAL | Status: DC
  Administered 2014-06-16 – 2014-06-17 (×3): 8.6 mg via ORAL
  Filled 2014-06-16 (×5): qty 1

## 2014-06-16 MED ORDER — MOMETASONE FURO-FORMOTEROL FUM 100-5 MCG/ACT IN AERO
2.0000 | INHALATION_SPRAY | Freq: Two times a day (BID) | RESPIRATORY_TRACT | Status: DC
  Administered 2014-06-16 – 2014-06-18 (×4): 2 via RESPIRATORY_TRACT
  Filled 2014-06-16: qty 8.8

## 2014-06-16 MED ORDER — 0.9 % SODIUM CHLORIDE (POUR BTL) OPTIME
TOPICAL | Status: DC | PRN
  Administered 2014-06-16: 1000 mL

## 2014-06-16 MED ORDER — PROPOFOL 10 MG/ML IV BOLUS
INTRAVENOUS | Status: AC
  Filled 2014-06-16: qty 20

## 2014-06-16 MED ORDER — OXYCODONE HCL 5 MG/5ML PO SOLN: 5.0000 mg | Freq: Once | ORAL | Status: AC | PRN

## 2014-06-16 MED ORDER — METHOCARBAMOL 1000 MG/10ML IJ SOLN
500.0000 mg | Freq: Four times a day (QID) | INTRAVENOUS | Status: DC | PRN
  Filled 2014-06-16: qty 5

## 2014-06-16 MED ORDER — CLINDAMYCIN PHOSPHATE 600 MG/50ML IV SOLN
600.0000 mg | Freq: Once | INTRAVENOUS | Status: AC
  Administered 2014-06-16: 600 mg via INTRAVENOUS
  Filled 2014-06-16: qty 50

## 2014-06-16 MED ORDER — MORPHINE SULFATE 2 MG/ML IJ SOLN
1.0000 mg | INTRAMUSCULAR | Status: DC | PRN
  Administered 2014-06-16 – 2014-06-17 (×2): 1 mg via INTRAVENOUS
  Filled 2014-06-16 (×2): qty 1

## 2014-06-16 SURGICAL SUPPLY — 55 items
BANDAGE ELASTIC 3 VELCRO ST LF (GAUZE/BANDAGES/DRESSINGS) ×2 IMPLANT
BANDAGE ELASTIC 4 VELCRO ST LF (GAUZE/BANDAGES/DRESSINGS) ×4 IMPLANT
BNDG CONFORM 2 STRL LF (GAUZE/BANDAGES/DRESSINGS) ×2 IMPLANT
BNDG CONFORM 3 STRL LF (GAUZE/BANDAGES/DRESSINGS) ×2 IMPLANT
BNDG GAUZE ELAST 4 BULKY (GAUZE/BANDAGES/DRESSINGS) ×8 IMPLANT
CORDS BIPOLAR (ELECTRODE) ×4 IMPLANT
CUFF TOURNIQUET SINGLE 18IN (TOURNIQUET CUFF) ×2 IMPLANT
CUFF TOURNIQUET SINGLE 24IN (TOURNIQUET CUFF) IMPLANT
DRAPE EXTREMITY T 121X128X90 (DRAPE) ×2 IMPLANT
DRSG ADAPTIC 3X8 NADH LF (GAUZE/BANDAGES/DRESSINGS) ×4 IMPLANT
DRSG MEPILEX BORDER 4X8 (GAUZE/BANDAGES/DRESSINGS) ×2 IMPLANT
ELECT REM PT RETURN 9FT ADLT (ELECTROSURGICAL)
ELECTRODE REM PT RTRN 9FT ADLT (ELECTROSURGICAL) IMPLANT
GAUZE SPONGE 4X4 12PLY STRL (GAUZE/BANDAGES/DRESSINGS) ×4 IMPLANT
GAUZE XEROFORM 1X8 LF (GAUZE/BANDAGES/DRESSINGS) ×4 IMPLANT
GAUZE XEROFORM 5X9 LF (GAUZE/BANDAGES/DRESSINGS) ×2 IMPLANT
GLOVE BIOGEL M STRL SZ7.5 (GLOVE) ×8 IMPLANT
GLOVE SS BIOGEL STRL SZ 8 (GLOVE) ×2 IMPLANT
GLOVE SUPERSENSE BIOGEL SZ 8 (GLOVE) ×8
GOWN STRL REUS W/ TWL LRG LVL3 (GOWN DISPOSABLE) ×6 IMPLANT
GOWN STRL REUS W/ TWL XL LVL3 (GOWN DISPOSABLE) ×6 IMPLANT
GOWN STRL REUS W/TWL LRG LVL3 (GOWN DISPOSABLE) ×12
GOWN STRL REUS W/TWL XL LVL3 (GOWN DISPOSABLE) ×12
HANDPIECE INTERPULSE COAX TIP (DISPOSABLE)
K-WIRE DBL TROCAR .045X4 ×8 IMPLANT
K-WIRE DBL TROCAR .062X4 ×8 IMPLANT
KIT BASIN OR (CUSTOM PROCEDURE TRAY) ×4 IMPLANT
KIT ROOM TURNOVER OR (KITS) ×4 IMPLANT
KWIRE DBL TROCAR .045X4 IMPLANT
KWIRE DBL TROCAR .062X4 IMPLANT
MANIFOLD NEPTUNE II (INSTRUMENTS) ×4 IMPLANT
NDL HYPO 25GX1X1/2 BEV (NEEDLE) IMPLANT
NEEDLE HYPO 25GX1X1/2 BEV (NEEDLE) IMPLANT
NS IRRIG 1000ML POUR BTL (IV SOLUTION) ×4 IMPLANT
PACK ORTHO EXTREMITY (CUSTOM PROCEDURE TRAY) ×4 IMPLANT
PAD ARMBOARD 7.5X6 YLW CONV (MISCELLANEOUS) ×8 IMPLANT
PAD CAST 4YDX4 CTTN HI CHSV (CAST SUPPLIES) ×2 IMPLANT
PADDING CAST COTTON 4X4 STRL (CAST SUPPLIES) ×4
SET CYSTO W/LG BORE CLAMP LF (SET/KITS/TRAYS/PACK) ×4 IMPLANT
SET HNDPC FAN SPRY TIP SCT (DISPOSABLE) IMPLANT
SPLINT FIBERGLASS 3X35 (CAST SUPPLIES) ×2 IMPLANT
SPONGE GAUZE 4X4 12PLY STER LF (GAUZE/BANDAGES/DRESSINGS) ×2 IMPLANT
SPONGE LAP 18X18 X RAY DECT (DISPOSABLE) ×2 IMPLANT
SPONGE LAP 4X18 X RAY DECT (DISPOSABLE) ×4 IMPLANT
SUT CHROMIC 4 0 PS 2 18 (SUTURE) ×2 IMPLANT
SUT CHROMIC 5 0 P 3 (SUTURE) ×2 IMPLANT
SUT PROLENE 4 0 PS 2 18 (SUTURE) ×2 IMPLANT
SYR CONTROL 10ML LL (SYRINGE) IMPLANT
TOWEL OR 17X24 6PK STRL BLUE (TOWEL DISPOSABLE) ×4 IMPLANT
TOWEL OR 17X26 10 PK STRL BLUE (TOWEL DISPOSABLE) ×4 IMPLANT
TUBE ANAEROBIC SPECIMEN COL (MISCELLANEOUS) IMPLANT
TUBE CONNECTING 12'X1/4 (SUCTIONS) ×1
TUBE CONNECTING 12X1/4 (SUCTIONS) ×3 IMPLANT
WATER STERILE IRR 1000ML POUR (IV SOLUTION) ×2 IMPLANT
YANKAUER SUCT BULB TIP NO VENT (SUCTIONS) ×4 IMPLANT

## 2014-06-16 NOTE — Anesthesia Postprocedure Evaluation (Signed)
  Anesthesia Post-op Note  Patient: Delynn Flavinnthony Marcotte  Procedure(s) Performed: Procedure(s): IRRIGATION AND DEBRIDEMENT RIGHT THUMB AND FIRST FINGER AND PINNING  (Right) IRRIGATION AND DEBRIDEMENT POSTERIOR KNEE (Right) REPAIR ODF THREE LACERATIONS POSTERIOR KNEE (Right)  Patient Location: PACU  Anesthesia Type:General  Level of Consciousness: awake and alert   Airway and Oxygen Therapy: Patient Spontanous Breathing  Post-op Pain: mild  Post-op Assessment: Post-op Vital signs reviewed  Post-op Vital Signs: stable  Last Vitals:  Filed Vitals:   06/16/14 1523  BP: 148/94  Pulse: 74  Temp:   Resp: 17    Complications: No apparent anesthesia complications

## 2014-06-16 NOTE — Anesthesia Preprocedure Evaluation (Signed)
Anesthesia Evaluation  Patient identified by MRN, date of birth, ID band Patient awake  General Assessment Comment:Ate Breakfast * am  Reviewed: Allergy & Precautions, H&P , NPO status , Patient's Chart, lab work & pertinent test results  History of Anesthesia Complications Negative for: history of anesthetic complications  Airway Mallampati: I       Dental  (+) Teeth Intact   Pulmonary asthma , COPDCurrent Smoker,  breath sounds clear to auscultation        Cardiovascular Rhythm:Regular Rate:Normal     Neuro/Psych  Neuromuscular disease    GI/Hepatic Neg liver ROS, GERD-  ,  Endo/Other    Renal/GU negative Renal ROS     Musculoskeletal   Abdominal   Peds  Hematology   Anesthesia Other Findings   Reproductive/Obstetrics                             Anesthesia Physical Anesthesia Plan  ASA: II and emergent  Anesthesia Plan: General   Post-op Pain Management:    Induction: Intravenous and Rapid sequence  Airway Management Planned: Oral ETT  Additional Equipment:   Intra-op Plan:   Post-operative Plan: Extubation in OR  Informed Consent: I have reviewed the patients History and Physical, chart, labs and discussed the procedure including the risks, benefits and alternatives for the proposed anesthesia with the patient or authorized representative who has indicated his/her understanding and acceptance.   Dental advisory given  Plan Discussed with:   Anesthesia Plan Comments:         Anesthesia Quick Evaluation

## 2014-06-16 NOTE — Anesthesia Procedure Notes (Signed)
Procedure Name: Intubation Date/Time: 06/16/2014 1:07 PM Performed by: Leonel Ramsay'LAUGHLIN, Skye Rodarte H Pre-anesthesia Checklist: Patient identified, Timeout performed, Emergency Drugs available, Suction available and Patient being monitored Patient Re-evaluated:Patient Re-evaluated prior to inductionOxygen Delivery Method: Circle system utilized Preoxygenation: Pre-oxygenation with 100% oxygen Intubation Type: IV induction, Rapid sequence and Cricoid Pressure applied Laryngoscope Size: Mac and 3 Grade View: Grade I Tube type: Oral Tube size: 7.5 mm Number of attempts: 1 Airway Equipment and Method: Stylet Placement Confirmation: ETT inserted through vocal cords under direct vision,  positive ETCO2 and breath sounds checked- equal and bilateral Secured at: 21 cm Tube secured with: Tape Dental Injury: Teeth and Oropharynx as per pre-operative assessment

## 2014-06-16 NOTE — Op Note (Signed)
See ZOXWRUEAV#[id number]#[id number] Amanda PeaGramig MD

## 2014-06-16 NOTE — ED Notes (Signed)
Consent obtained by Ortho

## 2014-06-16 NOTE — ED Notes (Signed)
Pt reports multiple dog bites that occurred last night by pit bull. Has large amounts of swelling to right hand, large puncture wounds to back of right knee.

## 2014-06-16 NOTE — Transfer of Care (Signed)
Immediate Anesthesia Transfer of Care Note  Patient: Keith Weber  Procedure(s) Performed: Procedure(s): IRRIGATION AND DEBRIDEMENT RIGHT THUMB AND FIRST FINGER AND PINNING  (Right) IRRIGATION AND DEBRIDEMENT POSTERIOR KNEE (Right) REPAIR ODF THREE LACERATIONS POSTERIOR KNEE (Right)  Patient Location: PACU  Anesthesia Type:General  Level of Consciousness: awake, alert  and oriented  Airway & Oxygen Therapy: Patient Spontanous Breathing and Patient connected to nasal cannula oxygen  Post-op Assessment: Report given to PACU RN and Post -op Vital signs reviewed and stable  Post vital signs: Reviewed and stable  Complications: No apparent anesthesia complications

## 2014-06-16 NOTE — Progress Notes (Signed)
ANTIBIOTIC CONSULT NOTE - INITIAL  Pharmacy Consult for clindamycin Indication: hand infection s/p I and D  Allergies  Allergen Reactions  . Shellfish Allergy Anaphylaxis and Shortness Of Breath  . Amoxicillin Hives  . Iodine Nausea And Vomiting and Swelling  . Peanut-Containing Drug Products Other (See Comments)    unknown    Patient Measurements:   Adjusted Body Weight:   Vital Signs: Temp: 97.9 F (36.6 C) (11/07 1551) Temp Source: Oral (11/07 1551) BP: 147/88 mmHg (11/07 1551) Pulse Rate: 80 (11/07 1551) Intake/Output from previous day:   Intake/Output from this shift: Total I/O In: 900 [P.O.:200; I.V.:700] Out: 250 [Urine:200; Blood:50]  Labs:  Recent Labs  06/16/14 0933  WBC 9.4  HGB 15.6  PLT 262  CREATININE 1.86*   CrCl cannot be calculated (Unknown ideal weight.). No results for input(s): VANCOTROUGH, VANCOPEAK, VANCORANDOM, GENTTROUGH, GENTPEAK, GENTRANDOM, TOBRATROUGH, TOBRAPEAK, TOBRARND, AMIKACINPEAK, AMIKACINTROU, AMIKACIN in the last 72 hours.   Microbiology: No results found for this or any previous visit (from the past 720 hour(s)).  Medical History: Past Medical History  Diagnosis Date  . Asthma   . Sciatic nerve pain   . COPD (chronic obstructive pulmonary disease)   . GERD (gastroesophageal reflux disease)     Medications:  Scheduled:  . [START ON 06/17/2014] ciprofloxacin  400 mg Intravenous Q24H  . [START ON 06/17/2014] Influenza vac split quadrivalent PF  0.5 mL Intramuscular Tomorrow-1000  . mometasone-formoterol  2 puff Inhalation BID  . senna  1 tablet Oral BID   Infusions:  . sodium chloride     Assessment: 43 yo male with hand infection s/p I&D will be put on clindamycin.  Patient received one dose of clindamycin 600 mg iv x1 at 1034 today  Goal of Therapy:  resolution of infection  Plan:  - clindamycin 600 mg iv q8h, next dose at 1830.   Elaisha Zahniser, Tsz-Yin 06/16/2014,4:14 PM

## 2014-06-16 NOTE — Op Note (Signed)
Keith Weber, Keith Weber NO.:  [id number]  MEDICAL RECORD NO.:  [id number]  LOCATION:  [id number]                        FACILITY:  [facility]  PHYSICIAN:  Dionne Ano. [name], M.D.DATE OF BIRTH:  [birth year]  DATE OF PROCEDURE: DATE OF DISCHARGE:                              OPERATIVE REPORT   PREOPERATIVE DIAGNOSES: 1. Status post multiple dog bite to the right thumb, wrist, and hand. 2. Status post multiple dog bites to the posterior right thigh and     popliteal fossa. 3. First metacarpal fracture at the base. 4. Intra-articular distal interphalangeal joint fracture, right thumb     with EPL incompetence.  POSTOPERATIVE DIAGNOSES: 1. Status post multiple dog bite to the right thumb, wrist, and hand. 2. Status post multiple dog bites to the posterior right thigh and     popliteal fossa. 3. First metacarpal fracture at the base. 4. Intra-articular distal interphalangeal joint fracture, right thumb     with EPL incompetence.  PROCEDURE: 1. Irrigation and debridement of skin, subcutaneous tissue, muscle,     right forearm. 2. I and D of skin about the thumb MCP joint, full-thickness     debridement. 3. I and D skin, full-thickness right thumb IP joint with no deep     penetration to the open fracture site. 4. Closed reduction and pinning, thumb 1st metacarpal fracture with     two 0.062 K-wires. 5. Pinning IP joint and distal phalanx fracture intra-articular in     nature IP joint, right thumb. 6. X-ray AP, lateral, and oblique performed, examined, and interpreted     by myself, right hand and thumb. 7. I and D of skin, subcutaneous tissue, and muscle x3 separate dog     bites, right popliteal fossa. 8. Loose closure 2 cm lac about the popliteal fossa, right leg. 9. Loose closure 1 cm lac about the popliteal fossa, right leg.  SURGEON:  Noralee Stain, M.D.  ASSISTANT:  Karie Chimera, PA-C.  COMPLICATION:  None.  ANESTHESIA:  General.  TOURNIQUET TIME:   None.  INDICATIONS:  The patient was bitten by dog last night,  It is greater than 12 hours out. He presents with fractures about the thumb, metacarpal, and the IP joint of his thumb, right upper extremity.  He has multiple dog bites. The dog bites about his thumb are significant and certainly we would query an open fracture. We will need formal operative debridement to ascertain.  The patient understands risks and benefits, do's and don'ts.  He does have a large amount of popliteal fossa injury about the right leg, and we are planning an aggressive I and D of this region as well.  I have discussed with him these issues, do's and don'ts.  I would recommend aggressive irrigation and debridement, treatment of the fractures accordingly to the depth of penetration of the wounds adjacent to these areas and IV antibiotics in aggressive fashion.  The patient concurs and desires to proceed.  OPERATION IN DETAIL:  The patient was seen by myself and Anesthesia, taken to operative suite and underwent smooth induction of general anesthetic.  He was laid supine and fully padded, underwent final time- out.  Following this, I  and D of a 2.5-cm dog lac  was accomplished.  I performed sharp knife blade dissection down through the subcutaneous tissue.  At this juncture, I performed I and D of skin, subcutaneous tissue, and deep muscle tissue.  This was an excisional debridement in nature about the distal third of the right forearm.  I should note, superficial radial nerve was not encountered or injured it appeared.  At this time, I then performed I and D of 2 dog injuries about the IP joint of the thumb and the MCP joint of the thumb.  These were superficial. There were full thickness for the most part about the skin, but did not penetrate deeply.  This was quite helpful as it allowed more latitude with his fracture management.  The certainly were involving the skin, but not deep to the subcu. These  were irrigated.  Following this, I and D with 3 L of saline was accomplished about all 3 wounds.  Following this, we then performed a sterile field being secured again and the patient underwent closed reduction of the metacarpal fracture followed by firing with a drill, two 0.062 K-wires under live x-ray. These pins were clipped below the skin surface and harnessed the fracture beautifully.  AP, lateral, and oblique x-rays were taken, performed exam, and interpreted by myself and deemed to be excellent in terms of the thumb positioning.  Following this, the patient then underwent very careful and cautious look to the IP joint of the thumb. We manipulated the thumb and then placed two 0.045 K-wires with the thumb flexed to engage the proximal fragment of the IP joint fracture.  The main distal fragment was then reduced and a intramedullary 0.045 K-wire was placed down the ________, prebent and then seated against itself.  This was all done under x-ray control.  This aligned the fracture nicely and did not require a big open incision.  The fracture interdigitated quite well.  Following this, we performed very careful and cautious irrigation followed by placement of Xeroform gauze and a thumb spica splint.  The patient tolerated this quite well.  Following this, we then performed very careful I and D of the leg.  He was separately prepped and draped with Hibiclens scrub, followed by 3 L placed through 3 separate wounds.  Two of the wounds were down to the tendon about the hamstring apparatus  and thus these were loosely closed.  One was a 2-cm laceration closure in a loose fashion and one was a 1-cm wound closure in a loose fashion.  The I and D involved skin, subcutaneous tissue, and tendon; this was excisional in nature.  We chose to tack these closed as we did not want to expose tendon in our field.  We will watch this closely for any infection. but we were very aggressive in  our irrigation and debridement and felt confident that this has a good chance of doing well.  Following this, the leg was dressed and the patient was awakened from anesthesia and taken to recovery room.  We used Hibiclens given his iodine allergy.  In addition to this, we are going to place him on Cipro and clindamycin given the fact that he has a penicillin allergy.  We will continue aggressive measures to try and eradicate his injury process to full recovery.  Pleasure to see him today.  All questions were encouraged and answered.     Dionne Ano. Amanda Pea, M.D.     [facility]  D:  06/16/2014  T:  06/16/2014  Job:  [id number]

## 2014-06-16 NOTE — ED Notes (Signed)
Transported to Short Stay

## 2014-06-16 NOTE — H&P (Signed)
Keith Weber is an 43 y.o. male.   Chief Complaint: multiple Dog bites with swelling about the right hand forearm and right popliteal fossa HPI: I was asked see the patient given his multiple dog bites. The patient was injured greater than 12 hours ago when a pit bull bit him in his right hand right forearm and right leg. He complains of pain as expected. He notes no prior injury to the thumb. He has a prior injury to the right middle finger with bony abnormality. He denies neck back chest or abdominal pain. The patient has a history of chronic back pain as well as asthma is noted. I discusses with him. He is allergic to penicillin. He has been given clindamycin. He is started on Cipro at my request present time.  Past Medical History  Diagnosis Date  . Asthma   . Sciatic nerve pain   . COPD (chronic obstructive pulmonary disease)   . GERD (gastroesophageal reflux disease)     History reviewed. No pertinent past surgical history.  History reviewed. No pertinent family history. Social History:  reports that he has been smoking.  He does not have any smokeless tobacco history on file. He reports that he drinks alcohol. He reports that he does not use illicit drugs.  Allergies:  Allergies  Allergen Reactions  . Shellfish Allergy Anaphylaxis and Shortness Of Breath  . Amoxicillin Hives  . Iodine Nausea And Vomiting and Swelling  . Peanut-Containing Drug Products Other (See Comments)    unknown    Medications Prior to Admission  Medication Sig Dispense Refill  . albuterol (PROVENTIL HFA;VENTOLIN HFA) 108 (90 BASE) MCG/ACT inhaler Inhale 1-2 puffs into the lungs every 6 (six) hours as needed for wheezing. 1 Inhaler 0  . albuterol (PROVENTIL) (2.5 MG/3ML) 0.083% nebulizer solution Take 2.5 mg by nebulization every 6 (six) hours as needed for wheezing or shortness of breath.    . Fluticasone-Salmeterol (ADVAIR DISKUS) 250-50 MCG/DOSE AEPB Inhale 1 puff into the lungs daily. 60 each 1  .  Multiple Vitamin (MULTIVITAMIN WITH MINERALS) TABS tablet Take 1 tablet by mouth daily.      Results for orders placed or performed during the hospital encounter of 06/16/14 (from the past 48 hour(s))  CBC with Differential     Status: None   Collection Time: 06/16/14  9:33 AM  Result Value Ref Range   WBC 9.4 4.0 - 10.5 K/uL   RBC 4.70 4.22 - 5.81 MIL/uL   Hemoglobin 15.6 13.0 - 17.0 g/dL   HCT 43.5 39.0 - 52.0 %   MCV 92.6 78.0 - 100.0 fL   MCH 33.2 26.0 - 34.0 pg   MCHC 35.9 30.0 - 36.0 g/dL   RDW 14.7 11.5 - 15.5 %   Platelets 262 150 - 400 K/uL   Neutrophils Relative % 66 43 - 77 %   Neutro Abs 6.3 1.7 - 7.7 K/uL   Lymphocytes Relative 18 12 - 46 %   Lymphs Abs 1.7 0.7 - 4.0 K/uL   Monocytes Relative 10 3 - 12 %   Monocytes Absolute 0.9 0.1 - 1.0 K/uL   Eosinophils Relative 5 0 - 5 %   Eosinophils Absolute 0.5 0.0 - 0.7 K/uL   Basophils Relative 1 0 - 1 %   Basophils Absolute 0.1 0.0 - 0.1 K/uL  Basic metabolic panel     Status: Abnormal   Collection Time: 06/16/14  9:33 AM  Result Value Ref Range   Sodium 141 137 - 147 mEq/L  Potassium 4.3 3.7 - 5.3 mEq/L   Chloride 104 96 - 112 mEq/L   CO2 26 19 - 32 mEq/L   Glucose, Bld 85 70 - 99 mg/dL   BUN 12 6 - 23 mg/dL   Creatinine, Ser 1.86 (H) 0.50 - 1.35 mg/dL   Calcium 8.4 8.4 - 10.5 mg/dL   GFR calc non Af Amer 42 (L) >90 mL/min   GFR calc Af Amer 49 (L) >90 mL/min    Comment: (NOTE) The eGFR has been calculated using the CKD EPI equation. This calculation has not been validated in all clinical situations. eGFR's persistently <90 mL/min signify possible Chronic Kidney Disease.    Anion gap 11 5 - 15  Sedimentation rate     Status: None   Collection Time: 06/16/14  9:33 AM  Result Value Ref Range   Sed Rate 1 0 - 16 mm/hr   Dg Knee Complete 4 Views Right  06/16/2014   CLINICAL DATA:  Dog bites to the knee  EXAM: RIGHT KNEE - COMPLETE 4+ VIEW  COMPARISON:  09/15/2008  FINDINGS: There is significant soft tissue gas  projecting over the distal posterior soft tissues of the thigh. No radiopaque foreign body. No acute osseous abnormality.  IMPRESSION: Significant soft tissue gas projecting over the distal posterior soft tissues of the thigh likely indicating penetrating trauma given the history of dog bite.   Electronically Signed   By: Conchita Paris M.D.   On: 06/16/2014 10:22   Dg Hand Complete Right  06/16/2014   CLINICAL DATA:  Dogbite, right hand pain  EXAM: RIGHT HAND - COMPLETE 3+ VIEW  COMPARISON:  Concurrently obtained radiographs of the right knee  FINDINGS: Comminuted fracture through the base of the distal phalanx of the thumb with involvement are the articular surface. Additionally, there is a comminuted fracture through the base of the first metacarpal. There is associated soft tissue swelling about the thumb interphalangeal joint and the thenar eminence. The remainder of the visualized bones and joints are intact and unremarkable.  IMPRESSION: 1. Comminuted fracture through the base of the distal phalanx of the thumb with involvement of the articular surface. 2. Comminuted fracture through the base of the first metacarpal with apex dorsal angulation of the fracture site. 3. Associated surrounding soft tissue swelling.   Electronically Signed   By: Jacqulynn Cadet M.D.   On: 06/16/2014 10:27    Review of Systems  Constitutional: Negative.   HENT: Negative.   Eyes: Negative.   Cardiovascular: Negative.   Gastrointestinal: Negative.   Genitourinary: Negative.   Neurological: Negative.   Psychiatric/Behavioral: Negative.     Blood pressure 145/88, pulse 91, temperature 97.4 F (36.3 C), temperature source Oral, resp. rate 20, SpO2 98 %. Physical Exam multiple dog bites about the right popliteal fossa. He is neurovascularly intact. He has bleeding from these sites and exquisite pain. Knee is stable without effusion hip and ankle are nontender.  Left lower extremity is neurovascularly  intact  Left upper extremity is atraumatic with IV access.  His right arm has a dog bite over the distal third of the form dorsally and injury with soft tissue swelling to the IP joint of the thumb as well as abrasion and dog bite regions about the right MCP joint. He also has swelling over the first metacarpal base.  X-rays show fracture about metacarpal base and a interphalangeal joint fracture about the thumb.  Given these issues it is apparently why the patient can't flex or extend his  thumb  The patient is alert and oriented in no acute distress the patient complains of pain in the affected upper extremity.  The patient is noted to have a normal HEENT exam.  Lung fields show equal chest expansion and no shortness of breath  abdomen exam is nontender without distention.  Lower extremity examination does not show any fracture dislocation or blood clot symptoms.  Pelvis is stable neck and back are stable and nontender  Assessment/Plan Will plan for surgical intervention in the form of irrigation and debridement as well as exploration of wounds and definitive fixation of the fractures based upon our intraoperative findings. He understands the significance and severity of his injury and the fact that animal bites have a propensity towards infection.   We are planning surgery for your upper extremity. The risk and benefits of surgery include risk of bleeding infection anesthesia damage to normal structures and failure of the surgery to accomplish its intended goals of relieving symptoms and restoring function with this in mind we'll going to proceed. I have specifically discussed with the patient the pre-and postoperative regime and the does and don'ts and risk and benefits in great detail. Risk and benefits of surgery also include risk of dystrophy chronic nerve pain failure of the healing process to go onto completion and other inherent risks of surgery The relavent the pathophysiology of the  disease/injury process, as well as the alternatives for treatment and postoperative course of action has been discussed in great detail with the patient who desires to proceed.  We will do everything in our power to help you (the patient) restore function to the upper extremity. Is a pleasure to see this patient today.  Talena Neira III,Kathlyn Leachman M 06/16/2014, 3:10 PM

## 2014-06-16 NOTE — Plan of Care (Signed)
Problem: Phase II Progression Outcomes Goal: Pain controlled Outcome: Completed/Met Date Met:  06/16/14     

## 2014-06-16 NOTE — ED Provider Notes (Signed)
CSN: [id number]     Arrival date & time 06/16/14  7416 History   First MD Initiated Contact with Patient 06/16/14 [phone]     Chief Complaint  Patient presents with  . Animal Bite     (Consider location/radiation/quality/duration/timing/severity/associated sxs/prior Treatment) Patient is a 43 y.o. male presenting with animal bite. The history is provided by the patient. No language interpreter was used.  Animal Bite Contact animal:  Dog Location:  Hand and leg Hand injury location:  R hand Leg injury location:  R knee Time since incident:  11 hours Pain details:    Quality:  Sharp   Severity:  Severe   Timing:  Constant   Progression:  Worsening Incident location:  Home Provoked: unprovoked   Notifications:  None Animal's rabies vaccination status:  Unknown Animal in possession: no   Tetanus status:  Out of date Relieved by:  Nothing Worsened by:  Activity Ineffective treatments:  None tried Associated symptoms: swelling   Associated symptoms: no fever, no numbness and no rash     Past Medical History  Diagnosis Date  . Asthma   . Sciatic nerve pain   . COPD (chronic obstructive pulmonary disease)   . GERD (gastroesophageal reflux disease)    History reviewed. No pertinent past surgical history. History reviewed. No pertinent family history. History  Substance Use Topics  . Smoking status: Current Some Day Smoker  . Smokeless tobacco: Not on file  . Alcohol Use: Yes     Comment: occ    Review of Systems  Constitutional: Negative for fever.  HENT: Negative for congestion, rhinorrhea and sore throat.   Respiratory: Negative for cough and shortness of breath.   Cardiovascular: Negative for chest pain.  Gastrointestinal: Negative for nausea, vomiting, abdominal pain and diarrhea.  Genitourinary: Negative for dysuria and hematuria.  Skin: Positive for wound (dog bites to right hand and right posterior knee). Negative for rash.  Neurological: Negative for syncope,  light-headedness, numbness and headaches.  All other systems reviewed and are negative.     Allergies  Shellfish allergy; Amoxicillin; Iodine; and Peanut-containing drug products  Home Medications   Prior to Admission medications   Medication Sig Start Date End Date Taking? Authorizing Provider  albuterol (PROVENTIL HFA;VENTOLIN HFA) 108 (90 BASE) MCG/ACT inhaler Inhale 1-2 puffs into the lungs every 6 (six) hours as needed for wheezing. 03/26/14   Larene Pickett, PA-C  albuterol (PROVENTIL) (2.5 MG/3ML) 0.083% nebulizer solution Take 2.5 mg by nebulization every 6 (six) hours as needed for wheezing or shortness of breath.    Historical Provider, MD  Fluticasone-Salmeterol (ADVAIR DISKUS) 250-50 MCG/DOSE AEPB Inhale 1 puff into the lungs daily. 03/15/14   Tiffany Marilu Favre, PA-C  predniSONE (DELTASONE) 10 MG tablet Take 4 tablets (40 mg total) by mouth daily. 03/21/14   Fredia Sorrow, MD  ranitidine (ZANTAC) 75 MG tablet Take 75 mg by mouth 2 (two) times daily.    Historical Provider, MD  sulfamethoxazole-trimethoprim (SEPTRA DS) 800-160 MG per tablet Take 1 tablet by mouth 2 (two) times daily. 03/15/14   Tiffany Marilu Favre, PA-C   BP 144/84 mmHg  Pulse 94  Temp(Src) 98.7 F (37.1 C) (Oral)  Resp 22  SpO2 96% Physical Exam  Constitutional: He is oriented to person, place, and time. He appears well-developed and well-nourished.  HENT:  Head: Normocephalic and atraumatic.  Right Ear: External ear normal.  Left Ear: External ear normal.  Eyes: EOM are normal.  Neck: Normal range of motion. Neck supple.  Cardiovascular: Normal rate, regular rhythm and intact distal pulses.  Exam reveals no gallop and no friction rub.   No murmur heard. Pulmonary/Chest: Effort normal and breath sounds normal. No respiratory distress. He has no wheezes. He has no rales. He exhibits no tenderness.  Abdominal: Soft. Bowel sounds are normal. He exhibits no distension. There is no tenderness. There is no rebound.   Musculoskeletal: Normal range of motion. He exhibits tenderness. He exhibits no edema.  Right Upper Extremity INSPECTION: 1 cm punctate wound to right forearm, punctate wound to right thumb. Swelling of the thenar eminance PALPATION: Tender to palpation over thenar eminence. ROM: Good to normal AROM and PROM in wrist,elbow and shoulder joint. VASCULAR: Extremity warm and well-perfused. 2+ radial SENSORY: sensation is intact to light touch in superficial radial (dorsal first web space), median (tip of index finger), ulnar (tip of small finger) nerve distributions. MOTOR:+motor posterior interosseous nerve (thumb IP extension), anterior interosseous nerve (thumb IP flexion, index finger DIP flexion), radial nerve (wrist extension), median nerve (palpable firing thenar mass), ulnar nerve (palpable firing of first dorsal interosseous muscle).   Right Lower Extremity INSPECTION: 1.5 cm tissue defect posterior knee. 2 punctate wounds to posterior knee. Abrasions over patella PALPATION: tenderness to palpation over wounds ROM: Decreased global ROM due to pain. VASCULAR: Extremity warm and well-perfused. 2+ dosalis pedis and posterior tibialis pulses. Capillary refill <2 seconds NEURO-SENSORY: Normal sensibility to light touch in DP/SP/sural/saphenous distributions. No numbness or paresthesias. No focal sensory deficit. NEURO-MOTOR: Intact EHL/FHL/TA/GS motor function. No focal motor deficit.  Lymphadenopathy:    He has no cervical adenopathy.  Neurological: He is alert and oriented to person, place, and time.  Skin: Skin is warm. No rash noted.  Psychiatric: He has a normal mood and affect. His behavior is normal.  Nursing note and vitals reviewed.   ED Course  Procedures (including critical care time) Labs Review Labs Reviewed  BASIC METABOLIC PANEL - Abnormal; Notable for the following:    Creatinine, Ser 1.86 (*)    GFR calc non Af Amer 42 (*)    GFR calc Af Amer 49 (*)    All other  components within normal limits  CBC WITH DIFFERENTIAL  SEDIMENTATION RATE  C-REACTIVE PROTEIN    Imaging Review Dg Knee Complete 4 Views Right  06/16/2014   CLINICAL DATA:  Dog bites to the knee  EXAM: RIGHT KNEE - COMPLETE 4+ VIEW  COMPARISON:  09/15/2008  FINDINGS: There is significant soft tissue gas projecting over the distal posterior soft tissues of the thigh. No radiopaque foreign body. No acute osseous abnormality.  IMPRESSION: Significant soft tissue gas projecting over the distal posterior soft tissues of the thigh likely indicating penetrating trauma given the history of dog bite.   Electronically Signed   By: Conchita Paris M.D.   On: 06/16/2014 10:22   Dg Hand Complete Right  06/16/2014   CLINICAL DATA:  Dogbite, right hand pain  EXAM: RIGHT HAND - COMPLETE 3+ VIEW  COMPARISON:  Concurrently obtained radiographs of the right knee  FINDINGS: Comminuted fracture through the base of the distal phalanx of the thumb with involvement are the articular surface. Additionally, there is a comminuted fracture through the base of the first metacarpal. There is associated soft tissue swelling about the thumb interphalangeal joint and the thenar eminence. The remainder of the visualized bones and joints are intact and unremarkable.  IMPRESSION: 1. Comminuted fracture through the base of the distal phalanx of the thumb with involvement of the  articular surface. 2. Comminuted fracture through the base of the first metacarpal with apex dorsal angulation of the fracture site. 3. Associated surrounding soft tissue swelling.   Electronically Signed   By: Jacqulynn Cadet M.D.   On: 06/16/2014 10:27     EKG Interpretation None      MDM   Final diagnoses:  None    9:07 AM Pt is a 43 y.o. male with pertinent PMHX of asthma, sciatica, COPD, GERD who presents to the ED with dog bite. Patient bitten last night around 10PM, unprovoked by neighbors dog. Unknown immunization status. Tetanus not up to  date. No nausea, vomiting or diarrhea. No fevers. Endorses swelling sharp constant pain to wounds over right forearm and thumb. Also endorsing sharp pains to right posterior knee. Scattered abrasions to right and left patella and posterior right leg. No chest pain or shortness of breath.   On exam: as above. Open wounds with concern for soft tissue space infection of the thenar eminence. Plan for update tetanus shot. Plain films of the right hand and right knee. Will also obtain CBC, BMP, ESr, CRP.  Discussed case with Dr. Amedeo Plenty with orthopedic hand for concern for soft tissue space infection  Patient with amoxicillin allergy: rash, will possibly use doxycycline. Will plan for IV clindamycin  Review of labs: CBCno leukocytosis, H&H 15.6/43.5 BMP: elevated cr 1.88, no electrolyte abnormalities CRP: pending ESR: 1  Xr right hand concern for comminuted fracture of distal phalanx and proximal first MCP. Concerning for open fracture  XR right knee showing soft tissue gas no fracture  Ortho consulted for concern for open fracture. Clindamycin started. Ortho requesting cipro 400 IV with plan to go to OR urgently for open fracture and wash out  Labs, EKG and imaging reviewed by myself and considered in medical decision making if ordered.  Imaging interpreted by radiology. Pt was discussed with my attending, Dr. Samson Frederic, MD 06/16/14 Person, MD 06/17/14 [phone]

## 2014-06-17 NOTE — Evaluation (Signed)
Physical Therapy Evaluation Patient Details Name: Keith Weber MRN: [id number] DOB: [birth year] Today's Date: 06/17/2014   History of Present Illness  Admitted post multiple dog bites to R thumb, wrist, and hand and post R thigh and popliteal fossa; now s/p I&Ds of R forearm, R thumb MCP and IP joints with pinnings, I&Ds of skin, subcutaneous tissue, and muscle popliteal fossa, and lac closures.  Clinical Impression   Patient evaluated by Physical Therapy with no further acute PT needs identified. All education has been completed and the patient has no further questions.  See below for any follow-up Physical Therapy or equipment needs. PT is signing off. Thank you for this referral.     Follow Up Recommendations Outpatient PTor OT The potential need for Outpatient PT or OT can be addressed at Ortho follow-up appointments.     Equipment Recommendations  Other (comment) (Mission Sling)    Recommendations for Other Services       Precautions / Restrictions Precautions Precautions: None Restrictions RUE Weight Bearing: Weight bear through elbow only      Mobility  Bed Mobility Overal bed mobility: Modified Independent                Transfers Overall transfer level: Modified independent   Transfers: Sit to/from Stand Sit to Stand: Modified independent (Device/Increase time)            Ambulation/Gait Ambulation/Gait assistance: Supervision Ambulation Distance (Feet): 500 Feet Assistive device: None Gait Pattern/deviations: Decreased stance time - right Gait velocity: slowed   General Gait Details: cues to fully extend R hip and knee in stance  Stairs Stairs: Yes Stairs assistance: Modified independent (Device/Increase time) Stair Management: One rail Right;No rails;Forwards;Step to pattern;Alternating pattern Number of Stairs: 5 (x4) General stair comments: Gave pt cues fro step sequence to manage with less pain, which he performed well; he then worked  on alternating pattern as his RLE felt "more stretched out"  Wheelchair Mobility    Modified Rankin (Stroke Patients Only)       Balance                                             Pertinent Vitals/Pain Pain Assessment: 0-10 Pain Score: 5  Pain Location: RUE Pain Descriptors / Indicators: Aching Pain Intervention(s): Monitored during session;Repositioned    Home Living Family/patient expects to be discharged to:: Private residence Living Arrangements: Parent Available Help at Discharge: Family;Other (Comment) (parents not in good health) Type of Home: House Home Access: Ramped entrance     Home Layout: One level Home Equipment: Bedside commode;Shower seat      Prior Function Level of Independence: Independent               Hand Dominance   Dominant Hand: Right    Extremity/Trunk Assessment   Upper Extremity Assessment: Defer to OT evaluation RUE Deficits / Details: able to move four digits and move shoulder         Lower Extremity Assessment: RLE deficits/detail RLE Deficits / Details: Painful to move, but ROM and strength are adequate for safe amb       Communication   Communication: No difficulties  Cognition Arousal/Alertness: Awake/alert Behavior During Therapy: WFL for tasks assessed/performed Overall Cognitive Status: Within Functional Limits for tasks assessed  General Comments General comments (skin integrity, edema, etc.): mocked up a mission sling for pt    Exercises        Assessment/Plan    PT Assessment Patent does not need any further PT services  PT Diagnosis Difficulty walking;Acute pain   PT Problem List    PT Treatment Interventions     PT Goals (Current goals can be found in the Care Plan section) Acute Rehab PT Goals Patient Stated Goal: less pain PT Goal Formulation: All assessment and education complete, DC therapy    Frequency     Barriers to discharge         Co-evaluation               End of Session   Activity Tolerance: Patient tolerated treatment well Patient left: in chair;with call bell/phone within reach Nurse Communication: Mobility status         Time: 2956-2130 PT Time Calculation (min): 32 min   Charges:   PT Evaluation $Initial PT Evaluation Tier I: 1 Procedure PT Treatments $Gait Training: 23-37 mins   PT G CodesOlen Pel 06/17/2014, 6:11 PM  Van Clines,   Acute Rehabilitation Services Pager [phone] Office [phone]

## 2014-06-17 NOTE — Progress Notes (Signed)
Patient ID: Keith Weber, male   DOB: 22-Nov-1970, 43 y.o.   MRN: [id number] Patient is alert and oriented.  Patient denies new pain complaints.  Patient has pain in his right leg but can put weight on it and is ambulatory.  Patient's right arm is intact vascular exam his fingers move nicely there is no spreading or ascending erythema or obvious problems.  The patient and I discussed these issues at length.  Chest is clear. Abdomen is nontender. Heart regular rate. Left upper extremity and left lower extremity are stable  Given his penicillin hour G we will continue clindamycin and Cipro.  The patient does not have health insurance and I discussed with him he will have to pay a significant amount of money for his antibiotics. He is can a make some calls today so that we can hopefully discharge him tomorrow with proper follow-up in my office and he will be able to purchase the antibiotics  At present time no new complaints no complications and he looks very stable  Dominica SeverinWilliam Calla Wedekind M.D.

## 2014-06-17 NOTE — Plan of Care (Signed)
Problem: Phase I Progression Outcomes Goal: Sutures/staples intact Outcome: Completed/Met Date Met:  06/17/14 Goal: Tubes/drains patent Outcome: Not Applicable Date Met:  06/17/14 Goal: Initial discharge plan identified Outcome: Progressing Goal: Voiding-avoid urinary catheter unless indicated Outcome: Completed/Met Date Met:  06/17/14 Goal: Vital signs/hemodynamically stable Outcome: Completed/Met Date Met:  06/17/14  Problem: Phase II Progression Outcomes Goal: Progress activity as tolerated unless otherwise ordered Outcome: Completed/Met Date Met:  06/17/14 Goal: Tolerating diet Outcome: Completed/Met Date Met:  06/17/14     

## 2014-06-17 NOTE — Plan of Care (Signed)
Problem: Phase I Progression Outcomes Goal: Pain controlled with appropriate interventions Outcome: Progressing Goal: OOB as tolerated unless otherwise ordered Outcome: Progressing Goal: Incision/dressings dry and intact Outcome: Completed/Met Date Met:  06/17/14

## 2014-06-17 NOTE — Progress Notes (Signed)
Utilization Review Completed.   Errin Whitelaw, RN, BSN Nurse Case Manager  

## 2014-06-17 NOTE — Evaluation (Signed)
Occupational Therapy Evaluation Patient Details Name: Keith Weber MRN: [id number] DOB: 06/28/1971 Today's Date: 06/17/2014    History of Present Illness Admitted post multiple dog bites to R thumb, wrist, and hand and post R thigh and popliteal fossa; now s/p I&Ds of R forearm, R thumb MCP and IP joints with pinnings, I&Ds of skin, subcutaneous tissue, and muscle popliteal fossa, and lac closures.   Clinical Impression   Pt s/p above. Pt independent with ADLs, PTA. Feel pt will benefit from acute OT to increase independence prior to d/c. Plan to reinforce edema management techniques next session and practice ADLs.     Follow Up Recommendations  No OT follow up;Supervision/Assistance - 24 hour    Equipment Recommendations  None recommended by OT    Recommendations for Other Services       Precautions / Restrictions Precautions Precautions: Fall Restrictions Weight Bearing Restrictions: Yes RUE Weight Bearing: Weight bear through elbow only      Mobility Bed Mobility Overal bed mobility: Modified Independent                Transfers Overall transfer level: Needs assistance   Transfers: Sit to/from Stand Sit to Stand: Min guard         General transfer comment: cues for technique    Balance                                            ADL Overall ADL's : Needs assistance/impaired     Grooming: Wash/dry hands;Set up;Supervision/safety;Standing               Lower Body Dressing: Minimal assistance;Sit to/from stand   Toilet Transfer: Min guard;Ambulation;Comfort height toilet;Grab bars   Toileting- Clothing Manipulation and Hygiene: Supervision/safety (standing)       Functional mobility during ADLs: Min guard General ADL Comments: Educated on dressing technique for both UB and LB. Instructed pt to elevate RUE and be moving digits on RUE frequently. Ice applied at end of session. Told pt that it is okay to use four digits for  light use. explained elastic shoe laces may be helpful for shoes. Educated on safety- rugs, clutter/cords, recommended sitting for most of bathing and for most of LB dressing.       Vision                     Perception     Praxis      Pertinent Vitals/Pain Pain Assessment: 0-10 Pain Score:  (7.5) Pain Location: RUE Pain Intervention(s): RN gave pain meds during session;Repositioned;Ice applied     Hand Dominance Right   Extremity/Trunk Assessment Upper Extremity Assessment Upper Extremity Assessment: RUE deficits/detail RUE Deficits / Details: able to move four digits and move shoulder RUE: Unable to fully assess due to immobilization   Lower Extremity Assessment Lower Extremity Assessment: Defer to PT evaluation       Communication Communication Communication: No difficulties   Cognition Arousal/Alertness: Awake/alert Behavior During Therapy: WFL for tasks assessed/performed Overall Cognitive Status: Within Functional Limits for tasks assessed                     General Comments       Exercises Exercises: Other exercises Other Exercises Other Exercises: retrograde massage Other Exercises: moving digits   Shoulder Instructions      Home Living Family/patient expects to  be discharged to:: Private residence Living Arrangements: Parent Available Help at Discharge: Family;Other (Comment) (parents not in good health) Type of Home: House Home Access: Ramped entrance     Home Layout: One level     Bathroom Shower/Tub: Walk-in shower         Home Equipment: Bedside commode;Shower seat          Prior Functioning/Environment Level of Independence: Independent             OT Diagnosis: Acute pain   OT Problem List: Decreased range of motion;Decreased activity tolerance;Impaired balance (sitting and/or standing);Decreased knowledge of use of DME or AE;Decreased knowledge of precautions;Pain;Impaired UE functional use;Increased  edema   OT Treatment/Interventions: Self-care/ADL training;Therapeutic exercise;DME and/or AE instruction;Therapeutic activities;Patient/family education;Balance training    OT Goals(Current goals can be found in the care plan section) Acute Rehab OT Goals Patient Stated Goal: not stated OT Goal Formulation: With patient Time For Goal Achievement: 06/24/14 Potential to Achieve Goals: Good ADL Goals Pt Will Perform Lower Body Dressing: with modified independence;sit to/from stand;with adaptive equipment Pt Will Transfer to Toilet: with modified independence;ambulating (3 in 1 over commode) Additional ADL Goal #1: Pt will independently verbalize and demonstrate 3/3 edema management techniques.  OT Frequency: Min 2X/week   Barriers to D/C:            Co-evaluation              End of Session Equipment Utilized During Treatment: Gait belt Nurse Communication: Patient requests pain meds  Activity Tolerance: Patient tolerated treatment well Patient left: in bed;with call bell/phone within reach   Time: 1324-4010 OT Time Calculation (min): 27 min Charges:  OT General Charges $OT Visit: 1 Procedure OT Evaluation $Initial OT Evaluation Tier I: 1 Procedure OT Treatments $Self Care/Home Management : 8-22 mins G-Codes: OT G-codes **NOT FOR INPATIENT CLASS** Functional Assessment Tool Used: clinical judgment Functional Limitation: Self care Self Care Current Status (U7253): At least 20 percent but less than 40 percent impaired, limited or restricted Self Care Goal Status (G6440): 0 percent impaired, limited or restricted  Earlie Raveling OTR/L [phone] 06/17/2014, 11:10 AM

## 2014-06-18 LAB — C-REACTIVE PROTEIN

## 2014-06-18 MED ORDER — METHOCARBAMOL 500 MG PO TABS: 500.0000 mg | ORAL_TABLET | Freq: Four times a day (QID) | ORAL | Status: DC | PRN

## 2014-06-18 MED ORDER — CLINDAMYCIN HCL 300 MG PO CAPS: 300.0000 mg | ORAL_CAPSULE | Freq: Four times a day (QID) | ORAL | Status: DC

## 2014-06-18 MED ORDER — RABIES VACCINE, PCEC IM SUSR
1.0000 mL | Freq: Once | INTRAMUSCULAR | Status: AC
  Administered 2014-06-18: 1 mL via INTRAMUSCULAR
  Filled 2014-06-18: qty 1

## 2014-06-18 MED ORDER — OXYCODONE HCL 5 MG PO TABS: 5.0000 mg | ORAL_TABLET | ORAL | Status: DC | PRN

## 2014-06-18 MED ORDER — RABIES IMMUNE GLOBULIN 150 UNIT/ML IM INJ
20.0000 [IU]/kg | INJECTION | Freq: Once | INTRAMUSCULAR | Status: AC
  Administered 2014-06-18: 1500 [IU] via INTRAMUSCULAR
  Filled 2014-06-18: qty 10

## 2014-06-18 MED ORDER — CIPROFLOXACIN HCL 500 MG PO TABS: 500.0000 mg | ORAL_TABLET | Freq: Two times a day (BID) | ORAL | Status: DC

## 2014-06-18 NOTE — Discharge Summary (Signed)
Physician Discharge Summary  Patient ID: Keith Weber MRN: [id number] DOB/AGE: 1970-12-18 43 y.o.  Admit date: 06/16/2014 Discharge date: 06/18/2014  Admission Diagnoses: dogbite right arm hand and right leg status post I and D and repair in the operative theater with ORIF first metacarpal of the thumb and distal phalanx of the thumb Discharge Diagnoses: status post dog bite with ORIF first metacarpal and distal phalanx right thumb. I and D right arm hand. I and D right leg and popliteal fossa. Active Problems:   Closed fracture of right thumb   Discharged Condition: good  Hospital Course: patient was made postop for IV antibiotics status post I and D as well as ORIF right hand. He did well he was admitted and tolerated his stay quite nicely. At the time discharge he was awake alert and oriented in no comp caring features. His wounds look very well about the leg which are dressed. He is tolerating a diet he is ready for discharge  Consults: None  Significant Diagnostic Studies: labs: see note  Treatments: surgery: C op note  Discharge Exam: Blood pressure 135/74, pulse 77, temperature 99.5 F (37.5 C), temperature source Oral, resp. rate 16, SpO2 97 %. General appearance: alert, cooperative and appears stated agepatient is stable no signs of infection.  I reviewed the bandage on his leg which looked excellent there is no signs of infection.  I asked the patient to take his antibiotics and to obtain these.  The patient is alert and oriented in no acute distress the patient complains of pain in the affected upper extremity.  The patient is noted to have a normal HEENT exam.  Lung fields show equal chest expansion and no shortness of breath  abdomen exam is nontender without distention.  Lower extremity examination does not show any fracture dislocation or blood clot symptoms.  Pelvis is stable neck and back are stable and nontender  Disposition: 01-Home or Self  Care  Discharge Instructions    Call MD / Call 911    Complete by:  As directed   If you experience chest pain or shortness of breath, CALL 911 and be transported to the hospital emergency room.  If you develope a fever above 101 F, pus (white drainage) or increased drainage or redness at the wound, or calf pain, call your surgeon's office.     Constipation Prevention    Complete by:  As directed   Drink plenty of fluids.  Prune juice may be helpful.  You may use a stool softener, such as Colace (over the counter) 100 mg twice a day.  Use MiraLax (over the counter) for constipation as needed.     Diet - low sodium heart healthy    Complete by:  As directed      Increase activity slowly as tolerated    Complete by:  As directed             Medication List    TAKE these medications        albuterol (2.5 MG/3ML) 0.083% nebulizer solution  Commonly known as:  PROVENTIL  Take 2.5 mg by nebulization every 6 (six) hours as needed for wheezing or shortness of breath.     albuterol 108 (90 BASE) MCG/ACT inhaler  Commonly known as:  PROVENTIL HFA;VENTOLIN HFA  Inhale 1-2 puffs into the lungs every 6 (six) hours as needed for wheezing.     ciprofloxacin 500 MG tablet  Commonly known as:  CIPRO  Take 1 tablet (500 mg total)  by mouth 2 (two) times daily.     clindamycin 300 MG capsule  Commonly known as:  CLEOCIN  Take 1 capsule (300 mg total) by mouth 4 (four) times daily.     Fluticasone-Salmeterol 250-50 MCG/DOSE Aepb  Commonly known as:  ADVAIR DISKUS  Inhale 1 puff into the lungs daily.     methocarbamol 500 MG tablet  Commonly known as:  ROBAXIN  Take 1 tablet (500 mg total) by mouth every 6 (six) hours as needed for muscle spasms.     multivitamin with minerals Tabs tablet  Take 1 tablet by mouth daily.     oxyCODONE 5 MG immediate release tablet  Commonly known as:  Oxy IR/ROXICODONE  Take 1-2 tablets (5-10 mg total) by mouth every 3 (three) hours as needed for moderate  pain.         SignedKaren Chafe 06/18/2014, 7:09 AM

## 2014-06-18 NOTE — Plan of Care (Signed)
Problem: Phase III Progression Outcomes Goal: Pain controlled on oral analgesia Outcome: Completed/Met Date Met:  06/18/14 Goal: Activity at appropriate level-compared to baseline (UP IN CHAIR FOR HEMODIALYSIS)  Outcome: Completed/Met Date Met:  06/18/14 Goal: Voiding independently Outcome: Completed/Met Date Met:  06/18/14 Goal: IV changed to normal saline lock Outcome: Completed/Met Date Met:  06/18/14 Goal: Nasogastric tube discontinued Outcome: Not Applicable Date Met:  49/70/26 Goal: Discharge plan remains appropriate-arrangements made Outcome: Completed/Met Date Met:  06/18/14 Goal: Demonstrates TCDB, IS independently Outcome: Completed/Met Date Met:  06/18/14 Goal: Other Phase III Outcomes/Goals Outcome: Completed/Met Date Met:  06/18/14  Problem: Discharge Progression Outcomes Goal: Barriers To Progression Addressed/Resolved Outcome: Completed/Met Date Met:  06/18/14 Goal: Discharge plan in place and appropriate Outcome: Completed/Met Date Met:  06/18/14 Goal: Pain controlled with appropriate interventions Outcome: Completed/Met Date Met:  06/18/14 Goal: Hemodynamically stable Outcome: Completed/Met Date Met:  37/85/88 Goal: Complications resolved/controlled Outcome: Adequate for Discharge Goal: Tolerating diet Outcome: Completed/Met Date Met:  06/18/14 Goal: Activity appropriate for discharge plan Outcome: Completed/Met Date Met:  06/18/14 Goal: Tubes and drains discontinued if indicated Outcome: Completed/Met Date Met:  06/18/14 Goal: Staples/sutures removed Outcome: Not Applicable Date Met:  50/27/74 Goal: Steri-Strips applied Outcome: Not Applicable Date Met:  12/87/86 Goal: Other Discharge Outcomes/Goals Outcome: Completed/Met Date Met:  06/18/14

## 2014-06-18 NOTE — Progress Notes (Signed)
Occupational Therapy Treatment Patient Details Name: Adley Andersen MRN: [id number] DOB: 1970/10/17 Today's Date: 06/18/2014    History of present illness Admitted post multiple dog bites to R thumb, wrist, and hand and post R thigh and popliteal fossa; now s/p I&Ds of R forearm, R thumb MCP and IP joints with pinnings, I&Ds of skin, subcutaneous tissue, and muscle popliteal fossa, and lac closures.   OT comments  Pt. Was ed on use of elastic laces but wanted to try his own laces. Pt. Was able to tighten laces and stick in shoes and was able to AMB comfortabley. Pt. Was able to AMB in room and to commode at Mod I level. Pt. Requested to AMB in hallway and was Mod I without LOB. Pt. Educated on elevation, and exercise for edema management. Pt. Goals met and pt. Is being d/c from OT services.  Follow Up Recommendations  No OT follow up    Equipment Recommendations  None recommended by OT    Recommendations for Other Services      Precautions / Restrictions Precautions Precautions: Fall Restrictions Weight Bearing Restrictions: Yes RUE Weight Bearing: Weight bearing as tolerated       Mobility Bed Mobility                  Transfers Overall transfer level: Modified independent                    Balance                                   ADL Overall ADL's : Modified independent                     Lower Body Dressing: Modified independent   Toilet Transfer: Modified Independent   Toileting- Clothing Manipulation and Hygiene: Modified independent       Functional mobility during ADLs: Modified independent General ADL Comments: educated pt. on increasing I with ADLs with using R hand as gross A. Pt. ed. on elevation for edema managment.       Vision                     Perception     Praxis      Cognition   Behavior During Therapy: WFL for tasks assessed/performed Overall Cognitive Status: Within Functional Limits  for tasks assessed                       Extremity/Trunk Assessment               Exercises     Shoulder Instructions       General Comments      Pertinent Vitals/ Pain       Pain Assessment: 0-10 Pain Score: 2  Pain Location:  ( R UE) Pain Descriptors / Indicators: Aching Pain Intervention(s): Monitored during session  Home Living                                          Prior Functioning/Environment              Frequency       Progress Toward Goals  OT Goals(current goals can now be found in the care plan section)     Acute  Rehab OT Goals Patient Stated Goal: go home  Plan      Co-evaluation                 End of Session     Activity Tolerance Patient tolerated treatment well   Patient Left in chair;with call bell/phone within reach   Nurse Communication          Time: 4098-1191 OT Time Calculation (min): 25 min  Charges: OT General Charges $OT Visit: 1 Procedure OT Treatments $Self Care/Home Management : 23-37 mins  Ervin Hensley 06/18/2014, 9:18 AM

## 2014-06-18 NOTE — Discharge Instructions (Signed)
Please elevate your arm and move your fingers frequently.  It is okay to bear weight on the leg. We will change her bandage Thursday in my office.  Please come to my office Thursday at 8 AM for your follow-up

## 2014-06-19 ENCOUNTER — Encounter (HOSPITAL_COMMUNITY): Payer: Self-pay | Admitting: Orthopedic Surgery

## 2014-09-19 ENCOUNTER — Encounter (HOSPITAL_COMMUNITY): Payer: Self-pay

## 2014-09-19 ENCOUNTER — Emergency Department (HOSPITAL_COMMUNITY): Payer: PRIVATE HEALTH INSURANCE

## 2014-09-19 ENCOUNTER — Emergency Department (HOSPITAL_COMMUNITY)
Admission: EM | Admit: 2014-09-19 | Discharge: 2014-09-19 | Disposition: A | Discharge status: Home / Self Care | Payer: PRIVATE HEALTH INSURANCE | Attending: Emergency Medicine | Admitting: Emergency Medicine

## 2014-09-19 DIAGNOSIS — K029 Dental caries, unspecified: Secondary | ICD-10-CM | POA: Insufficient documentation

## 2014-09-19 DIAGNOSIS — Z7951 Long term (current) use of inhaled steroids: Secondary | ICD-10-CM | POA: Diagnosis not present

## 2014-09-19 DIAGNOSIS — K219 Gastro-esophageal reflux disease without esophagitis: Secondary | ICD-10-CM | POA: Diagnosis not present

## 2014-09-19 DIAGNOSIS — R0602 Shortness of breath: Secondary | ICD-10-CM | POA: Diagnosis present

## 2014-09-19 DIAGNOSIS — Z720 Tobacco use: Secondary | ICD-10-CM | POA: Diagnosis not present

## 2014-09-19 DIAGNOSIS — J441 Chronic obstructive pulmonary disease with (acute) exacerbation: Secondary | ICD-10-CM | POA: Diagnosis not present

## 2014-09-19 DIAGNOSIS — Z8739 Personal history of other diseases of the musculoskeletal system and connective tissue: Secondary | ICD-10-CM | POA: Insufficient documentation

## 2014-09-19 DIAGNOSIS — J4521 Mild intermittent asthma with (acute) exacerbation: Secondary | ICD-10-CM

## 2014-09-19 DIAGNOSIS — Z880 Allergy status to penicillin: Secondary | ICD-10-CM | POA: Insufficient documentation

## 2014-09-19 DIAGNOSIS — Z792 Long term (current) use of antibiotics: Secondary | ICD-10-CM | POA: Insufficient documentation

## 2014-09-19 DIAGNOSIS — Z79899 Other long term (current) drug therapy: Secondary | ICD-10-CM | POA: Diagnosis not present

## 2014-09-19 MED ORDER — OMEPRAZOLE 20 MG PO CPDR: 20.0000 mg | DELAYED_RELEASE_CAPSULE | Freq: Every day | ORAL | Status: DC

## 2014-09-19 MED ORDER — AZITHROMYCIN 250 MG PO TABS: 250.0000 mg | ORAL_TABLET | Freq: Every day | ORAL | Status: DC

## 2014-09-19 MED ORDER — IPRATROPIUM-ALBUTEROL 0.5-2.5 (3) MG/3ML IN SOLN
RESPIRATORY_TRACT | Status: AC
  Filled 2014-09-19: qty 3

## 2014-09-19 MED ORDER — IPRATROPIUM-ALBUTEROL 0.5-2.5 (3) MG/3ML IN SOLN
3.0000 mL | RESPIRATORY_TRACT | Status: DC
  Administered 2014-09-19: 3 mL via RESPIRATORY_TRACT

## 2014-09-19 MED ORDER — ALBUTEROL SULFATE (2.5 MG/3ML) 0.083% IN NEBU: 2.5000 mg | INHALATION_SOLUTION | Freq: Four times a day (QID) | RESPIRATORY_TRACT | Status: DC | PRN

## 2014-09-19 MED ORDER — ALBUTEROL SULFATE HFA 108 (90 BASE) MCG/ACT IN AERS: 1.0000 | INHALATION_SPRAY | Freq: Four times a day (QID) | RESPIRATORY_TRACT | Status: DC | PRN

## 2014-09-19 MED ORDER — IBUPROFEN 800 MG PO TABS: 800.0000 mg | ORAL_TABLET | Freq: Three times a day (TID) | ORAL | Status: DC | PRN

## 2014-09-19 MED ORDER — RANITIDINE HCL 150 MG PO CAPS: 150.0000 mg | ORAL_CAPSULE | Freq: Every day | ORAL | Status: DC

## 2014-09-19 MED ORDER — ALBUTEROL SULFATE HFA 108 (90 BASE) MCG/ACT IN AERS
2.0000 | INHALATION_SPRAY | RESPIRATORY_TRACT | Status: DC | PRN
  Administered 2014-09-19: 2 via RESPIRATORY_TRACT
  Filled 2014-09-19: qty 6.7

## 2014-09-19 NOTE — ED Notes (Signed)
Pt st's he feels better after breathing tx.  Pt resting watching television.  No complaints voiced

## 2014-09-19 NOTE — Discharge Instructions (Signed)
Please follow up with a dentist for further management of your dental pain.  Take antibiotic as prescribed.  Use inhaler as needed.  Use resource below to find a primary care provider for further management of your health.     Emergency Department Resource Guide 1) Find a Doctor and Pay Out of Pocket Although you won't have to find out who is covered by your insurance plan, it is a good idea to ask around and get recommendations. You will then need to call the office and see if the doctor you have chosen will accept you as a new patient and what types of options they offer for patients who are self-pay. Some doctors offer discounts or will set up payment plans for their patients who do not have insurance, but you will need to ask so you aren't surprised when you get to your appointment.  2) Contact Your Local Health Department Not all health departments have doctors that can see patients for sick visits, but many do, so it is worth a call to see if yours does. If you don't know where your local health department is, you can check in your phone book. The CDC also has a tool to help you locate your state's health department, and many state websites also have listings of all of their local health departments.  3) Find a Walk-in Clinic If your illness is not likely to be very severe or complicated, you may want to try a walk in clinic. These are popping up all over the country in pharmacies, drugstores, and shopping centers. They're usually staffed by nurse practitioners or physician assistants that have been trained to treat common illnesses and complaints. They're usually fairly quick and inexpensive. However, if you have serious medical issues or chronic medical problems, these are probably not your best option.  No Primary Care Doctor: - Call Health Connect at  [phone] - they can help you locate a primary care doctor that  accepts your insurance, provides certain services, etc. - Physician Referral  Service- [phone]  Chronic Pain Problems: Organization         Address  Phone   Notes  [facility]  [phone] Patients need to be referred by their primary care doctor.   Medication Assistance: Organization         Address  Phone   Notes  [facility] Medication [facility] [address] [place], [place] [address] [phone]) [phone] --Must be a resident of [facility] [place] [facility] [place] [place] -- Must have NO insurance coverage whatsoever (no Medicaid/ Medicare, etc.) -- The pt. MUST have a primary care doctor that directs their care regularly and follows them in the community   MedAssist  [phone]   [employer]  [phone]    Agencies that provide inexpensive medical care: Organization         Address  Phone   Notes  [facility]  [phone]   [facility]    [phone]   [facility] [address] [place], [place] [address] [phone]   [facility] [address], [place] [phone]) [phone]   [facility]anned Parenthood    [phone]   [facility]    [phone](336) [phone]   [facility]  [address], [place] Phone:  [phone]) [phone], Fax:  [phone]) [phone] Hours of Operation:  9 am - 6 pm, M-F.  Also accepts Medicaid/Medicare  and self-pay.  [facility] for [place], [address], [place] Phone: [phone], Fax: [phone]. Hours of Operation:  8:30 am - 5:30 pm, M-F.  Also accepts Medicaid and self-pay.  [facility] [address], [place] Phone: [phone]   [place], [place], [place] [phone], Ext. [phone] Mondays & Thursdays: 7-9 AM.  First 15 patients are seen on a first come, first serve basis.    [place] Providers:  Organization         Address  Phone   Notes  [facility] [address], [place] [phone] Also accepts self-pay patients.  [facility] [address], [place]  [phone]   [place], [address], [place] [phone]   [facility] [address] [place] [phone]   Lucianne Lei [address], [place]   [phone] Only accepts [place] Access [place] patients after they have their name applied to their card.   Self-Pay (no insurance) in [facility]:  Organization         Address  Phone   Notes  Sickle Cell Patients, [facility] [place] [phone]   [facility] [place] [phone]   [facility] [place]  [place], [place],  [phone]   [facility]/Dr. Osei-Bonsu  [address] [place] or [address], [place] [phone] Phone number for both [place] and [place] locations is the same.  [facility] [address] [place] [phone]   [facility] [address], [place] or [address] [phone]   [facility] [address], [place] [phone], phone; [phone], fax Sees patients 1st and 3rd Saturday of every month.  Must not qualify for public or private insurance (i.e. Medicaid, Medicare, [place] Health Choice, Veterans' Benefits)  Household income should be no more than 200% of the poverty level The clinic cannot treat you if you are pregnant or think you are pregnant  Sexually transmitted diseases are not treated at the clinic.    Dental Care: Organization         Address  Phone  Notes  [facility] [place] [phone] Accepts children up to age 55 who are enrolled in  [place] or [place]; pregnant women with a Medicaid card; and children who have applied for Medicaid or [place] Health Choice, but were declined, whose parents can pay a reduced fee at time of service.  [facility]  [address], [place] [phone] Accepts children up to age 27 who are enrolled in [place] or [place]; pregnant women with a Medicaid card; and children who have applied for Medicaid or [place] Health Choice, but were declined, whose parents can pay a reduced fee at time of service.  [place] Adult Dental Access PROGRAM  [place] [phone] Patients are seen by appointment only. Walk-ins are not accepted. [place] will see patients 6 years of age and older. Monday - Tuesday (8am-5pm) Most Wednesdays (  8:30-5pm) $30 per visit, cash only  [facility] Adult Dental Access PROGRAM  [address], [facility] [phone] Patients are seen by appointment only. Walk-ins are not accepted. [place] will see patients 72 years of age and older. One Wednesday Evening (Monthly: Volunteer Based).  $30 per visit, cash only  [place]  [phone] for adults; Children under age 24, call Graduate Pediatric Dentistry at [phone]. Children aged 47-14, please call [phone] to request a pediatric application.  Dental services are provided in all areas of dental care including fillings, crowns and bridges, complete and partial dentures, implants, gum treatment, root canals, and extractions. Preventive care is also provided. Treatment is provided to both adults and children. Patients are selected via a lottery and there is often a waiting list.   [facility] [address], [place]  [phone] [email or web address]   [facility] [address] [place], [place] [phone], Ext. [phone] Second and Fourth Thursday of each month, opens at 6:30  AM; Clinic ends at 9 AM.  Patients are seen on a first-come first-served basis, and a limited number are seen during each clinic.   [facility]  [address] Hillard Danker [place], [place] [phone]   Eligibility Requirements You must have lived in [place], [place], or [place] counties for at least the last three months.   You cannot be eligible for state or federal sponsored [employer], including [employer], [place], or [employer].   You generally cannot be eligible for healthcare insurance through your employer.    How to apply: Eligibility screenings are held every Tuesday and Wednesday afternoon from 1:00 pm until 4:00 pm. You do not need an appointment for the interview!  [facility] [address] [place], [place]   [place]  [place] Department  [place]  [phone]    Behavioral Health Resources in the Community: Intensive Outpatient Programs Organization         Address  Phone  Notes  [place]. [address] [place], [place] [phone]   [facility] Outpatient [address] [place], [place]   ADS: Alcohol & Drug Svcs [address] [place], [place]   [place] [address],  [place], [place] or [phone]   Substance Abuse Resources Organization         Address  Phone  Notes  Alcohol and Drug Services  [phone]   [place]  [phone]   The [place]   Chinita Pester  [phone]   Residential & Outpatient Substance Abuse Program  [phone]   Psychological Services Organization         Address  Phone  Notes  [facility] [place]  [place]  [phone]   [place] [address] [place] or  [phone]    Mobile Crisis Teams Organization         Address  Phone  Notes  [facility], Mobile Crisis Care Unit  [phone]   [facility]  [address] [place], [place]   [place] Levels [address], [place] [phone]    Self-Help/Support Groups Organization         Address  Phone  Notes  [facility] [place] - variety of support groups  [place] Call for more information  Narcotics Anonymous (NA), [facility] [address], [employer] [place]  2 meetings at this location   Special educational needs teacher         Address  Phone  Notes  ASAP Residential Treatment [place],    [place]  [phone]   [facility]  [address] [place] [phone], [place], [place]   [place], [place] [phone] Admissions: 8am-3pm M-F  Incentives Substance [place] [address]    [place], [place] [phone]   [facility] [address] [place], [place], [place]   [facility] [address],  [place], [place]   [facility] Programs - Intensive Outpatient [address], [place], [place]   [facility] ([place].) [address].,  [place], [place] [phone] or [phone]   [facility] Services (RTS) [address] [place], [place] Accepts Medicaid  [facility] [place] [address],  [place] [phone] Substance Abuse/Addiction Treatment   [facility] Organization         Address  Phone  Notes  [facility]  [phone]   Domenic Schwab, PhD [address] Arlis Porta [place], [place]   [phone] or [phone]   [place] and [place], [place] [phone]   [facility] [address],  [place], [place] [phone] Insurance/Medicaid/sponsorship through [facility] and Families [address], [address] [place]                                    [place], [place] [phone] [place] [address], [place] [phone]    Dr. Adele Schilder  [phone]   [facility] Dept. 1) [address], [place] 2) [place] 3)  [place] [address], [place] [phone]  [phone]   [place] [phone] or [phone] (After Hours)

## 2014-09-19 NOTE — ED Provider Notes (Signed)
CSN: [id number]     Arrival date & time 09/19/14  1614 History   First MD Initiated Contact with Patient 09/19/14 1649     Chief Complaint  Patient presents with  . Shortness of Breath  . Dental Pain     (Consider location/radiation/quality/duration/timing/severity/associated sxs/prior Treatment) HPI   44 year old male with history of COPD and asthma, and chronic back pain who presents with multiple complaints. Patient states that he has ran out of his asthma medication for the past 3 weeks. He has noticed increased shortness of breath worsening at nighttime with chest congestion and nonproductive cough. He request to get an inhaler and nebulizing solution. He denies any prior history of intubation and ICU stay secondary to COPD or asthma exacerbation. He admits to smoke on occasion. He denies having any associated fever, hemoptysis, abdominal pain, nausea vomiting diarrhea. Patient also mentioned that she he has a history of GERD and does not have any medication for that. Complaining of burning sensation throughout his throat that is waxing waning throughout his life. Request for medication refill. Furthermore he mentioned having recurrent dental pain affecting his right upper molar. This has been ongoing for many years but worsened for the past 2 or 3 days. Describe pain as a sharp sensation, worsening with chewing, cold air, or cold water. He does not have a dentist. Patient admits that he does not have a primary care provider and does not have insurance. He has no other complaints.  Past Medical History  Diagnosis Date  . Asthma   . Sciatic nerve pain   . COPD (chronic obstructive pulmonary disease)   . GERD (gastroesophageal reflux disease)    Past Surgical History  Procedure Laterality Date  . I&d extremity Right 06/16/2014    Procedure: IRRIGATION AND DEBRIDEMENT RIGHT THUMB AND FIRST FINGER AND PINNING ;  Surgeon: Dominica Severin, MD;  Location: [facility] OR;  Service: Orthopedics;   Laterality: Right;  . Irrigation and debridement knee Right 06/16/2014    Procedure: IRRIGATION AND DEBRIDEMENT POSTERIOR KNEE;  Surgeon: Dominica Severin, MD;  Location: [facility] OR;  Service: Orthopedics;  Laterality: Right;  . Laceration repair Right 06/16/2014    Procedure: REPAIR ODF THREE LACERATIONS POSTERIOR KNEE;  Surgeon: Dominica Severin, MD;  Location: [facility] OR;  Service: Orthopedics;  Laterality: Right;   History reviewed. No pertinent family history. History  Substance Use Topics  . Smoking status: Current Some Day Smoker  . Smokeless tobacco: Not on file  . Alcohol Use: Yes     Comment: occ    Review of Systems  All other systems reviewed and are negative.     Allergies  Shellfish allergy; Amoxicillin; Iodine; and Peanut-containing drug products  Home Medications   Prior to Admission medications   Medication Sig Start Date End Date Taking? Authorizing Provider  albuterol (PROVENTIL HFA;VENTOLIN HFA) 108 (90 BASE) MCG/ACT inhaler Inhale 1-2 puffs into the lungs every 6 (six) hours as needed for wheezing. 03/26/14   Garlon Hatchet, PA-C  albuterol (PROVENTIL) (2.5 MG/3ML) 0.083% nebulizer solution Take 2.5 mg by nebulization every 6 (six) hours as needed for wheezing or shortness of breath.    Historical Provider, MD  ciprofloxacin (CIPRO) 500 MG tablet Take 1 tablet (500 mg total) by mouth 2 (two) times daily. 06/18/14   Dominica Severin, MD  clindamycin (CLEOCIN) 300 MG capsule Take 1 capsule (300 mg total) by mouth 4 (four) times daily. 06/18/14   Dominica Severin, MD  Fluticasone-Salmeterol (ADVAIR DISKUS) 250-50 MCG/DOSE AEPB Inhale 1 puff  into the lungs daily. 03/15/14   Tiffany Irine SealG Greene, PA-C  methocarbamol (ROBAXIN) 500 MG tablet Take 1 tablet (500 mg total) by mouth every 6 (six) hours as needed for muscle spasms. 06/18/14   Dominica SeverinWilliam Gramig, MD  Multiple Vitamin (MULTIVITAMIN WITH MINERALS) TABS tablet Take 1 tablet by mouth daily.    Historical Provider, MD  oxyCODONE (OXY  IR/ROXICODONE) 5 MG immediate release tablet Take 1-2 tablets (5-10 mg total) by mouth every 3 (three) hours as needed for moderate pain. 06/18/14   Dominica SeverinWilliam Gramig, MD   BP 135/103 mmHg  Pulse 109  Temp(Src) 98.7 F (37.1 C) (Oral)  Resp 22  SpO2 97% Physical Exam  Constitutional: He is oriented to person, place, and time. He appears well-developed and well-nourished. No distress.  HENT:  Head: Atraumatic.  Right Ear: External ear normal.  Left Ear: External ear normal.  Mouth: Significant dental decay noted to tooth 1, tenderness to palpation without any obvious abscess middle for drainage. No trismus noted.  Eyes: Conjunctivae are normal.  Neck: Normal range of motion. Neck supple.  Cardiovascular: Normal rate and regular rhythm.   Pulmonary/Chest: Effort normal. He exhibits no tenderness.  Faint inspiratory and respiratory wheezes heard throughout. No obvious rales or rhonchi. Patient able to speak in complete sentences and in no acute respiratory distress.  Abdominal: Soft. There is no tenderness.  Musculoskeletal: He exhibits no edema.  Lymphadenopathy:    He has no cervical adenopathy.  Neurological: He is alert and oriented to person, place, and time.  Skin: No rash noted.  Psychiatric: He has a normal mood and affect.    ED Course  Procedures (including critical care time)  Patient here with multiple complaints. Request for treatment for his dental pain. No evidence of abscess. Patient will receive antibiotic and dental referral. He also has ran out of his asthma medication, he is in acute respiratory distress, and maintain a normal oxygenation on room air. Patient initially came in with moderate wheezes however that has improved with albuterol and Atrovent nebs. He will receive an inhaler, along with a course of antibiotic to treat both lung infection and dental infection. Reports history of GERD, this is chronic in nature. Low suspicion for ACS or other acute emergent medical  condition. I will provide a PPI and H2 blocker. Patient will benefit from outpatient resources to find a primary care Dr. for further management of his medical condition.  Labs Review Labs Reviewed - No data to display  Imaging Review Dg Chest 2 View  09/19/2014   CLINICAL DATA:  Acute chest pain shortness of breath for 3 weeks. History of a asthma.  EXAM: CHEST  2 VIEW  COMPARISON:  03/15/2014  FINDINGS: Stable mild hyperinflation without focal pneumonia, collapse or consolidation. Normal heart size and vascularity. No edema, effusion or pneumothorax. Trachea midline. No acute osseous finding.  IMPRESSION: Stable mild hyperinflation.  No superimposed acute process.   Electronically Signed   By: Judie PetitM.  Shick M.D.   On: 09/19/2014 16:54     EKG Interpretation None      MDM   Final diagnoses:  Asthma exacerbation attacks, mild intermittent  Pain due to dental caries  Gastroesophageal reflux disease, esophagitis presence not specified    BP 134/79 mmHg  Pulse 86  Temp(Src) 99.2 F (37.3 C) (Oral)  Resp 18  SpO2 98%  I have reviewed nursing notes and vital signs. I personally reviewed the imaging tests through PACS system  I reviewed available ER/hospitalization records thought  the EMR     Fayrene Helper, PA-C 09/19/14 1836  Flint Melter, MD 09/20/14 Moses Manners

## 2014-09-19 NOTE — ED Notes (Signed)
Pt reporting dental pain and shortness of breath x 3 weeks.  Pt sts he ran out of his inhaler 3 weeks ago.  Expiratory wheezing in triage.

## 2014-09-19 NOTE — ED Notes (Addendum)
I gave the patient a happy meal, 2 packs of graham crackers and a ginger-ale per Lexmark InternationalPA-C Tran.

## 2014-10-09 ENCOUNTER — Emergency Department (HOSPITAL_COMMUNITY)
Admission: EM | Admit: 2014-10-09 | Discharge: 2014-10-10 | Disposition: A | Discharge status: Home / Self Care | Payer: PRIVATE HEALTH INSURANCE | Attending: Emergency Medicine | Admitting: Emergency Medicine

## 2014-10-09 ENCOUNTER — Emergency Department (HOSPITAL_COMMUNITY): Payer: PRIVATE HEALTH INSURANCE

## 2014-10-09 ENCOUNTER — Encounter (HOSPITAL_COMMUNITY): Payer: Self-pay | Admitting: Emergency Medicine

## 2014-10-09 DIAGNOSIS — Z880 Allergy status to penicillin: Secondary | ICD-10-CM | POA: Insufficient documentation

## 2014-10-09 DIAGNOSIS — J441 Chronic obstructive pulmonary disease with (acute) exacerbation: Secondary | ICD-10-CM | POA: Insufficient documentation

## 2014-10-09 DIAGNOSIS — Z8739 Personal history of other diseases of the musculoskeletal system and connective tissue: Secondary | ICD-10-CM | POA: Insufficient documentation

## 2014-10-09 DIAGNOSIS — J45901 Unspecified asthma with (acute) exacerbation: Secondary | ICD-10-CM

## 2014-10-09 DIAGNOSIS — Z79899 Other long term (current) drug therapy: Secondary | ICD-10-CM | POA: Insufficient documentation

## 2014-10-09 DIAGNOSIS — K219 Gastro-esophageal reflux disease without esophagitis: Secondary | ICD-10-CM | POA: Insufficient documentation

## 2014-10-09 DIAGNOSIS — Z720 Tobacco use: Secondary | ICD-10-CM | POA: Insufficient documentation

## 2014-10-09 DIAGNOSIS — Z7951 Long term (current) use of inhaled steroids: Secondary | ICD-10-CM | POA: Insufficient documentation

## 2014-10-09 DIAGNOSIS — Z792 Long term (current) use of antibiotics: Secondary | ICD-10-CM | POA: Insufficient documentation

## 2014-10-09 LAB — CBC WITH DIFFERENTIAL/PLATELET
BASOS ABS: 0.1 10*3/uL (ref 0.0–0.1)
Basophils Relative: 1 % (ref 0–1)
EOS PCT: 13 % — AB (ref 0–5)
Eosinophils Absolute: 1 10*3/uL — ABNORMAL HIGH (ref 0.0–0.7)
HCT: 42.3 % (ref 39.0–52.0)
Hemoglobin: 14.6 g/dL (ref 13.0–17.0)
LYMPHS PCT: 31 % (ref 12–46)
Lymphs Abs: 2.4 10*3/uL (ref 0.7–4.0)
MCH: 33.3 pg (ref 26.0–34.0)
MCHC: 34.5 g/dL (ref 30.0–36.0)
MCV: 96.6 fL (ref 78.0–100.0)
MONO ABS: 0.9 10*3/uL (ref 0.1–1.0)
Monocytes Relative: 11 % (ref 3–12)
NEUTROS ABS: 3.4 10*3/uL (ref 1.7–7.7)
NEUTROS PCT: 44 % (ref 43–77)
PLATELETS: 235 10*3/uL (ref 150–400)
RBC: 4.38 MIL/uL (ref 4.22–5.81)
RDW: 14 % (ref 11.5–15.5)
WBC: 7.8 10*3/uL (ref 4.0–10.5)

## 2014-10-09 LAB — BASIC METABOLIC PANEL
Anion gap: 3 — ABNORMAL LOW (ref 5–15)
BUN: 13 mg/dL (ref 6–23)
CHLORIDE: 109 mmol/L (ref 96–112)
CO2: 30 mmol/L (ref 19–32)
Calcium: 8.5 mg/dL (ref 8.4–10.5)
Creatinine, Ser: 1.41 mg/dL — ABNORMAL HIGH (ref 0.50–1.35)
GFR calc Af Amer: 68 mL/min — ABNORMAL LOW (ref >90)
GFR calc non Af Amer: 59 mL/min — ABNORMAL LOW (ref >90)
GLUCOSE: 109 mg/dL — AB (ref 70–99)
Potassium: 4.1 mmol/L (ref 3.5–5.1)
SODIUM: 142 mmol/L (ref 135–145)

## 2014-10-09 LAB — TROPONIN I: Troponin I: <0.03 ng/mL (ref <0.031)

## 2014-10-09 LAB — BRAIN NATRIURETIC PEPTIDE: B NATRIURETIC PEPTIDE 5: 10.2 pg/mL (ref 0.0–100.0)

## 2014-10-09 MED ORDER — IPRATROPIUM-ALBUTEROL 0.5-2.5 (3) MG/3ML IN SOLN
3.0000 mL | Freq: Once | RESPIRATORY_TRACT | Status: AC
  Administered 2014-10-09: 3 mL via RESPIRATORY_TRACT
  Filled 2014-10-09: qty 3

## 2014-10-09 MED ORDER — IPRATROPIUM-ALBUTEROL 0.5-2.5 (3) MG/3ML IN SOLN
3.0000 mL | Freq: Once | RESPIRATORY_TRACT | Status: AC
  Administered 2014-10-10: 3 mL via RESPIRATORY_TRACT
  Filled 2014-10-09: qty 3

## 2014-10-09 MED ORDER — PREDNISONE 20 MG PO TABS
60.0000 mg | ORAL_TABLET | Freq: Once | ORAL | Status: AC
  Administered 2014-10-10: 60 mg via ORAL
  Filled 2014-10-09: qty 3

## 2014-10-09 MED ORDER — ALBUTEROL SULFATE HFA 108 (90 BASE) MCG/ACT IN AERS
2.0000 | INHALATION_SPRAY | Freq: Once | RESPIRATORY_TRACT | Status: AC
  Administered 2014-10-10: 2 via RESPIRATORY_TRACT
  Filled 2014-10-09: qty 6.7

## 2014-10-09 NOTE — ED Provider Notes (Signed)
CSN: [id number]     Arrival date & time 10/09/14  2106 History   First MD Initiated Contact with Patient 10/09/14 2120     Chief Complaint  Patient presents with  . Shortness of Breath  . Asthma     (Consider location/radiation/quality/duration/timing/severity/associated sxs/prior Treatment) Patient is a 44 y.o. male presenting with shortness of breath.  Shortness of Breath Severity:  Moderate Onset quality:  Gradual Duration:  2 days Timing:  Constant Progression:  Worsening Chronicity:  Recurrent Context: URI   Relieved by:  Nothing Worsened by:  Nothing tried Associated symptoms: chest pain (L sided aching), cough (white productive) and fever (subj)   Associated symptoms: no vomiting     Past Medical History  Diagnosis Date  . Asthma   . Sciatic nerve pain   . COPD (chronic obstructive pulmonary disease)   . GERD (gastroesophageal reflux disease)    Past Surgical History  Procedure Laterality Date  . I&d extremity Right 06/16/2014    Procedure: IRRIGATION AND DEBRIDEMENT RIGHT THUMB AND FIRST FINGER AND PINNING ;  Surgeon: Dominica Severin, MD;  Location: [facility] OR;  Service: Orthopedics;  Laterality: Right;  . Irrigation and debridement knee Right 06/16/2014    Procedure: IRRIGATION AND DEBRIDEMENT POSTERIOR KNEE;  Surgeon: Dominica Severin, MD;  Location: [facility] OR;  Service: Orthopedics;  Laterality: Right;  . Laceration repair Right 06/16/2014    Procedure: REPAIR ODF THREE LACERATIONS POSTERIOR KNEE;  Surgeon: Dominica Severin, MD;  Location: [facility] OR;  Service: Orthopedics;  Laterality: Right;   History reviewed. No pertinent family history. History  Substance Use Topics  . Smoking status: Current Some Day Smoker  . Smokeless tobacco: Not on file  . Alcohol Use: Yes     Comment: occ    Review of Systems  Constitutional: Positive for fever (subj).  Respiratory: Positive for cough (white productive) and shortness of breath.   Cardiovascular: Positive for chest pain (L sided  aching).  Gastrointestinal: Negative for vomiting.  All other systems reviewed and are negative.     Allergies  Shellfish allergy; Amoxicillin; Iodine; and Peanut-containing drug products  Home Medications   Prior to Admission medications   Medication Sig Start Date End Date Taking? Authorizing Provider  albuterol (PROVENTIL HFA;VENTOLIN HFA) 108 (90 BASE) MCG/ACT inhaler Inhale 1-2 puffs into the lungs every 6 (six) hours as needed for wheezing. 09/19/14  Yes Fayrene Helper, PA-C  Fluticasone-Salmeterol (ADVAIR DISKUS) 250-50 MCG/DOSE AEPB Inhale 1 puff into the lungs daily. 03/15/14  Yes Dorthula Matas, PA-C  Multiple Vitamin (MULTIVITAMIN WITH MINERALS) TABS tablet Take 1 tablet by mouth daily.   Yes Historical Provider, MD  albuterol (PROVENTIL) (2.5 MG/3ML) 0.083% nebulizer solution Take 3 mLs (2.5 mg total) by nebulization every 6 (six) hours as needed for wheezing or shortness of breath. 10/10/14   Mirian Mo, MD  azithromycin (ZITHROMAX) 250 MG tablet Take 1 tablet (250 mg total) by mouth daily. Patient not taking: Reported on 10/09/2014 09/19/14   Fayrene Helper, PA-C  ibuprofen (ADVIL,MOTRIN) 800 MG tablet Take 1 tablet (800 mg total) by mouth every 8 (eight) hours as needed. Patient not taking: Reported on 10/09/2014 09/19/14   Fayrene Helper, PA-C  methocarbamol (ROBAXIN) 500 MG tablet Take 1 tablet (500 mg total) by mouth every 6 (six) hours as needed for muscle spasms. Patient not taking: Reported on 10/09/2014 06/18/14   Dominica Severin, MD  omeprazole (PRILOSEC) 20 MG capsule Take 1 capsule (20 mg total) by mouth daily. Patient not taking: Reported on 10/09/2014  09/19/14   Fayrene Helper, PA-C  oxyCODONE (OXY IR/ROXICODONE) 5 MG immediate release tablet Take 1-2 tablets (5-10 mg total) by mouth every 3 (three) hours as needed for moderate pain. 06/18/14   Dominica Severin, MD  predniSONE (DELTASONE) 50 MG tablet Take 1 tablet (50 mg total) by mouth daily. 10/10/14   Mirian Mo, MD  ranitidine (ZANTAC)  150 MG capsule Take 1 capsule (150 mg total) by mouth daily. Patient not taking: Reported on 10/09/2014 09/19/14   Fayrene Helper, PA-C   BP 102/83 mmHg  Pulse 79  Temp(Src) 97.8 F (36.6 C) (Axillary)  Resp 18  Ht  (1.702 m)  Wt 170 lb (77.111 kg)  BMI 26.62 kg/m2  SpO2 100% Physical Exam  Constitutional: He is oriented to person, place, and time. He appears well-developed and well-nourished.  HENT:  Head: Normocephalic and atraumatic.  Eyes: Conjunctivae and EOM are normal.  Neck: Normal range of motion. Neck supple.  Cardiovascular: Normal rate, regular rhythm and normal heart sounds.   Pulmonary/Chest: Effort normal. No respiratory distress. He has wheezes (diffusely).  Abdominal: He exhibits no distension. There is no tenderness. There is no rebound and no guarding.  Musculoskeletal: Normal range of motion.  Neurological: He is alert and oriented to person, place, and time.  Skin: Skin is warm and dry.  Vitals reviewed.   ED Course  Procedures (including critical care time) Labs Review Labs Reviewed  CBC WITH DIFFERENTIAL/PLATELET - Abnormal; Notable for the following:    Eosinophils Relative 13 (*)    Eosinophils Absolute 1.0 (*)    All other components within normal limits  BASIC METABOLIC PANEL - Abnormal; Notable for the following:    Glucose, Bld 109 (*)    Creatinine, Ser 1.41 (*)    GFR calc non Af Amer 59 (*)    GFR calc Af Amer 68 (*)    Anion gap 3 (*)    All other components within normal limits  TROPONIN I  BRAIN NATRIURETIC PEPTIDE    Imaging Review Dg Chest 2 View  10/09/2014   CLINICAL DATA:  Acute onset of upper expiratory wheezing. Initial encounter.  EXAM: CHEST  2 VIEW  COMPARISON:  Chest radiograph performed 09/19/2014  FINDINGS: The lungs are well-aerated and clear. There is no evidence of focal opacification, pleural effusion or pneumothorax. A left-sided nipple shadow is noted.  The heart is normal in size; the mediastinal contour is within  normal limits. No acute osseous abnormalities are seen.  IMPRESSION: No acute cardiopulmonary process seen.   Electronically Signed   By: Roanna Raider M.D.   On: 10/09/2014 22:47     EKG Interpretation   Date/Time:  Tuesday October 09 2014 21:10:56 EST Ventricular Rate:  90 PR Interval:  168 QRS Duration: 76 QT Interval:  341 QTC Calculation: 417 R Axis:   89 Text Interpretation:  Sinus rhythm Probable left ventricular hypertrophy  ST elevation suggests acute pericarditis Baseline wander in lead(s) V6 No  significant change since last tracing Confirmed by Mirian Mo [phone])  on 10/09/2014 9:21:42 PM      MDM   Final diagnoses:  Asthma attack    44 y.o. male with pertinent PMH of COPD, asthma presents with recurrent dyspnea.  Given duoneb by EMS.  Physical exam with wheezing.  Pt endorses chills, intermittent productive cough.    Wu as above unremarkable.  Symptoms relieved by duoneb.  Given prednisone course.  DC home in stable condition  I have reviewed all laboratory and  imaging studies if ordered as above  1. Asthma attack         Mirian MoMatthew Gentry, MD 10/10/14 1137

## 2014-10-09 NOTE — ED Notes (Signed)
Patient transported to X-ray 

## 2014-10-09 NOTE — ED Notes (Signed)
Pt states that he ran out of his inhaler today and is now having upper expiratory wheezing per EMS. Speaking complete sentences. Duoneb given en route. Alert and oriented.

## 2014-10-09 NOTE — ED Notes (Signed)
Bed: [id number] Expected date:  Expected time:  Means of arrival:  [facility]

## 2014-10-10 MED ORDER — PREDNISONE 50 MG PO TABS: 50.0000 mg | ORAL_TABLET | Freq: Every day | ORAL | Status: DC

## 2014-10-10 MED ORDER — ALBUTEROL SULFATE (2.5 MG/3ML) 0.083% IN NEBU: 2.5000 mg | INHALATION_SOLUTION | Freq: Four times a day (QID) | RESPIRATORY_TRACT | Status: DC | PRN

## 2014-10-10 NOTE — Discharge Instructions (Signed)

## 2014-10-15 ENCOUNTER — Emergency Department (HOSPITAL_COMMUNITY)
Admission: EM | Admit: 2014-10-15 | Discharge: 2014-10-15 | Disposition: A | Discharge status: Home / Self Care | Payer: PRIVATE HEALTH INSURANCE | Attending: Emergency Medicine | Admitting: Emergency Medicine

## 2014-10-15 ENCOUNTER — Encounter (HOSPITAL_COMMUNITY): Payer: Self-pay | Admitting: Emergency Medicine

## 2014-10-15 ENCOUNTER — Emergency Department (HOSPITAL_COMMUNITY): Payer: PRIVATE HEALTH INSURANCE

## 2014-10-15 DIAGNOSIS — Z720 Tobacco use: Secondary | ICD-10-CM | POA: Insufficient documentation

## 2014-10-15 DIAGNOSIS — Z8719 Personal history of other diseases of the digestive system: Secondary | ICD-10-CM | POA: Insufficient documentation

## 2014-10-15 DIAGNOSIS — Z7952 Long term (current) use of systemic steroids: Secondary | ICD-10-CM | POA: Diagnosis not present

## 2014-10-15 DIAGNOSIS — J441 Chronic obstructive pulmonary disease with (acute) exacerbation: Secondary | ICD-10-CM | POA: Insufficient documentation

## 2014-10-15 DIAGNOSIS — Z8739 Personal history of other diseases of the musculoskeletal system and connective tissue: Secondary | ICD-10-CM | POA: Diagnosis not present

## 2014-10-15 DIAGNOSIS — Z880 Allergy status to penicillin: Secondary | ICD-10-CM | POA: Insufficient documentation

## 2014-10-15 DIAGNOSIS — J4541 Moderate persistent asthma with (acute) exacerbation: Secondary | ICD-10-CM

## 2014-10-15 DIAGNOSIS — Z79899 Other long term (current) drug therapy: Secondary | ICD-10-CM | POA: Diagnosis not present

## 2014-10-15 DIAGNOSIS — J45909 Unspecified asthma, uncomplicated: Secondary | ICD-10-CM | POA: Diagnosis present

## 2014-10-15 DIAGNOSIS — Z792 Long term (current) use of antibiotics: Secondary | ICD-10-CM | POA: Diagnosis not present

## 2014-10-15 MED ORDER — ALBUTEROL (5 MG/ML) CONTINUOUS INHALATION SOLN
10.0000 mg/h | INHALATION_SOLUTION | Freq: Once | RESPIRATORY_TRACT | Status: AC
  Administered 2014-10-15: 10 mg/h via RESPIRATORY_TRACT
  Filled 2014-10-15: qty 20

## 2014-10-15 MED ORDER — FLUTICASONE-SALMETEROL 250-50 MCG/DOSE IN AEPB: 1.0000 | INHALATION_SPRAY | Freq: Two times a day (BID) | RESPIRATORY_TRACT | Status: DC

## 2014-10-15 MED ORDER — PREDNISONE 20 MG PO TABS
60.0000 mg | ORAL_TABLET | Freq: Once | ORAL | Status: AC
  Administered 2014-10-15: 60 mg via ORAL
  Filled 2014-10-15: qty 3

## 2014-10-15 MED ORDER — PREDNISONE 50 MG PO TABS
50.0000 mg | ORAL_TABLET | Freq: Every day | ORAL | Status: DC
Start: 2014-10-15 — End: 2014-11-26

## 2014-10-15 MED ORDER — ALBUTEROL SULFATE (2.5 MG/3ML) 0.083% IN NEBU: 2.5000 mg | INHALATION_SOLUTION | Freq: Four times a day (QID) | RESPIRATORY_TRACT | Status: DC | PRN

## 2014-10-15 NOTE — ED Notes (Signed)
Pt ambulated with pulse ox. O2 sats remained above 96%, HR maintained at 100.

## 2014-10-15 NOTE — ED Notes (Signed)
Per patient, states history of asthma, recently diagnosed with COPD-[facility]

## 2014-10-15 NOTE — ED Provider Notes (Signed)
2130 - Patient care assumed from Fayrene HelperBowie Tran, PA-C at shift change. Patient presented to the emergency department for asthma exacerbation. He was given a continuous nebulizer treatment after which time he ambulated without hypoxia; heart rate remained in NSR. Plan discussed with Ardelle Parkran, PA-C which includes discharge following second continuous albuterol neb which patient requested despite improvement in symptoms. Following this treatment, patient was found to be satting at 100% on room air. No indication for additional workup at this time. Patient discharged in good condition. Return precautions given. He has PCP f/u scheduled for tomorrow.   Filed Vitals:   10/15/14 1824 10/15/14 2112  BP: 152/89 138/86  Pulse: 92 98  Temp: 98.6 F (37 C)   TempSrc: Oral   Resp: 20 16  SpO2: 95% 99%     Antony MaduraKelly Duha Abair, PA-C 10/15/14 2159

## 2014-10-15 NOTE — ED Notes (Signed)
Respiratory called

## 2014-10-15 NOTE — Discharge Instructions (Signed)
Asthma °Asthma is a recurring condition in which the airways tighten and narrow. Asthma can make it difficult to breathe. It can cause coughing, wheezing, and shortness of breath. Asthma episodes, also called asthma attacks, range from minor to life-threatening. Asthma cannot be cured, but medicines and lifestyle changes can help control it. °CAUSES °Asthma is believed to be caused by inherited (genetic) and environmental factors, but its exact cause is unknown. Asthma may be triggered by allergens, lung infections, or irritants in the air. Asthma triggers are different for each person. Common triggers include:  °· Animal dander. °· Dust mites. °· Cockroaches. °· Pollen from trees or grass. °· Mold. °· Smoke. °· Air pollutants such as dust, household cleaners, hair sprays, aerosol sprays, paint fumes, strong chemicals, or strong odors. °· Cold air, weather changes, and winds (which increase molds and pollens in the air). °· Strong emotional expressions such as crying or laughing hard. °· Stress. °· Certain medicines (such as aspirin) or types of drugs (such as beta-blockers). °· Sulfites in foods and drinks. Foods and drinks that may contain sulfites include dried fruit, potato chips, and sparkling grape juice. °· Infections or inflammatory conditions such as the flu, a cold, or an inflammation of the nasal membranes (rhinitis). °· Gastroesophageal reflux disease (GERD). °· Exercise or strenuous activity. °SYMPTOMS °Symptoms may occur immediately after asthma is triggered or many hours later. Symptoms include: °· Wheezing. °· Excessive nighttime or early morning coughing. °· Frequent or severe coughing with a common cold. °· Chest tightness. °· Shortness of breath. °DIAGNOSIS  °The diagnosis of asthma is made by a review of your medical history and a physical exam. Tests may also be performed. These may include: °· Lung function studies. These tests show how much air you breathe in and out. °· Allergy  tests. °· Imaging tests such as X-rays. °TREATMENT  °Asthma cannot be cured, but it can usually be controlled. Treatment involves identifying and avoiding your asthma triggers. It also involves medicines. There are 2 classes of medicine used for asthma treatment:  °· Controller medicines. These prevent asthma symptoms from occurring. They are usually taken every day. °· Reliever or rescue medicines. These quickly relieve asthma symptoms. They are used as needed and provide short-term relief. °Your health care provider will help you create an asthma action plan. An asthma action plan is a written plan for managing and treating your asthma attacks. It includes a list of your asthma triggers and how they may be avoided. It also includes information on when medicines should be taken and when their dosage should be changed. An action plan may also involve the use of a device called a peak flow meter. A peak flow meter measures how well the lungs are working. It helps you monitor your condition. °HOME CARE INSTRUCTIONS  °· Take medicines only as directed by your health care provider. Speak with your health care provider if you have questions about how or when to take the medicines. °· Use a peak flow meter as directed by your health care provider. Record and keep track of readings. °· Understand and use the action plan to help minimize or stop an asthma attack without needing to seek medical care. °· Control your home environment in the following ways to help prevent asthma attacks: °¨ Do not smoke. Avoid being exposed to secondhand smoke. °¨ Change your heating and air conditioning filter regularly. °¨ Limit your use of fireplaces and wood stoves. °¨ Get rid of pests (such as roaches and   mice) and their droppings. °¨ Throw away plants if you see mold on them. °¨ Clean your floors and dust regularly. Use unscented cleaning products. °¨ Try to have someone else vacuum for you regularly. Stay out of rooms while they are  being vacuumed and for a short while afterward. If you vacuum, use a dust mask from a hardware store, a double-layered or microfilter vacuum cleaner bag, or a vacuum cleaner with a HEPA filter. °¨ Replace carpet with wood, tile, or vinyl flooring. Carpet can trap dander and dust. °¨ Use allergy-proof pillows, mattress covers, and box spring covers. °¨ Wash bed sheets and blankets every week in hot water and dry them in a dryer. °¨ Use blankets that are made of polyester or cotton. °¨ Clean bathrooms and kitchens with bleach. If possible, have someone repaint the walls in these rooms with mold-resistant paint. Keep out of the rooms that are being cleaned and painted. °¨ Wash hands frequently. °SEEK MEDICAL CARE IF:  °· You have wheezing, shortness of breath, or a cough even if taking medicine to prevent attacks. °· The colored mucus you cough up (sputum) is thicker than usual. °· Your sputum changes from clear or white to yellow, green, gray, or bloody. °· You have any problems that may be related to the medicines you are taking (such as a rash, itching, swelling, or trouble breathing). °· You are using a reliever medicine more than 2-3 times per week. °· Your peak flow is still at 50-79% of your personal best after following your action plan for 1 hour. °· You have a fever. °SEEK IMMEDIATE MEDICAL CARE IF:  °· You seem to be getting worse and are unresponsive to treatment during an asthma attack. °· You are short of breath even at rest. °· You get short of breath when doing very little physical activity. °· You have difficulty eating, drinking, or talking due to asthma symptoms. °· You develop chest pain. °· You develop a fast heartbeat. °· You have a bluish color to your lips or fingernails. °· You are light-headed, dizzy, or faint. °· Your peak flow is less than 50% of your personal best. °MAKE SURE YOU:  °· Understand these instructions. °· Will watch your condition. °· Will get help right away if you are not  doing well or get worse. °Document Released: 07/27/2005 Document Revised: 12/11/2013 Document Reviewed: 02/23/2013 °ExitCare® Patient Information ©2015 [employer]. This information is not intended to replace advice given to you by your health care provider. Make sure you discuss any questions you have with your health care provider. ° ° ° °Emergency Department Resource Guide °1) Find a Doctor and Pay Out of Pocket °Although you won't have to find out who is covered by your insurance plan, it is a good idea to ask around and get recommendations. You will then need to call the office and see if the doctor you have chosen will accept you as a new patient and what types of options they offer for patients who are self-pay. Some doctors offer discounts or will set up payment plans for their patients who do not have insurance, but you will need to ask so you aren't surprised when you get to your appointment. ° °2) Contact Your Local Health Department °Not all health departments have doctors that can see patients for sick visits, but many do, so it is worth a call to see if yours does. If you don't know where your local health department is, you can check   in your phone book. The CDC also has a tool to help you locate your state's health department, and many state websites also have listings of all of their local health departments. ° °3) Find a Walk-in Clinic °If your illness is not likely to be very severe or complicated, you may want to try a walk in clinic. These are popping up all over the country in pharmacies, drugstores, and shopping centers. They're usually staffed by nurse practitioners or physician assistants that have been trained to treat common illnesses and complaints. They're usually fairly quick and inexpensive. However, if you have serious medical issues or chronic medical problems, these are probably not your best option. ° °No Primary Care Doctor: °- Call [facility] at  832-8000 - they can help you  locate a primary care doctor that  accepts your insurance, provides certain services, etc. °- Physician Referral Service- 1-800-533-3463 ° °Chronic Pain Problems: °Organization         Address  Phone   Notes  °[facility]  (336) 297-2271 Patients need to be referred by their primary care doctor.  ° °Medication Assistance: °Organization         Address  Phone   Notes  °[place] [facility] [address] °[place], [place] [address] (336) 641-8030 --Must be a resident of [place] °-- Must have NO insurance coverage whatsoever (no Medicaid/ Medicare, etc.) °-- The pt. MUST have a primary care doctor that directs their care regularly and follows them in the community °  °[facility]  (866) 331-1348   °[employer]  (888) 892-1162   ° °Agencies that provide inexpensive medical care: °Organization         Address  Phone   Notes  °[facility]  (336) 832-8035   °[facility]    (336) 832-7272   °[facility] [address] °[place], [place] [address] (336) 832-4777   °[facility] [address], °[place] (336) 271-4999   °[facility]    (336) 373-0678   °[facility]    (336) 272-1050   °[facility] ° [address], [place] Phone:  (336) 832-4444, Fax:  (336) 832-4440 Hours of Operation:  9 am - 6 pm, M-F.  Also accepts Medicaid/Medicare and self-pay.  °[facility] ° [address], [place] Phone: (336) [phone], Fax: (336) [phone]. Hours of Operation:  8:30 am - 5:30 pm, M-F.  Also accepts Medicaid and self-pay.  °[facility] [place] [address], [place] Phone: (336) 878-6027   °[facility] [address], [place], [place] (336)723-1848, Ext. 123 Mondays & Thursdays: 7-9 AM.  First 15 patients are seen on a first come, first serve basis. °  ° °Medicaid-accepting [place]  Providers: ° °Organization         Address  Phone   Notes  °[facility] [address], [place] (336) 641-2100 Also accepts self-pay patients.  °[facility] [address], [place] ° (336) 856-9996   °[facility] [address], [place] (336) 288-8857   °[facility] [address] [place] [address], [place] (336) 299-7000   °Veita Bland [address], [place]  ° (336) 373-1557 Only accepts [place] Access Medicaid patients after they have their name applied to their   card.  ° °Self-Pay (no insurance) in [place]: ° °Organization         Address  Phone   Notes  °Sickle Cell Patients, [facility] [address], [place] (336) 832-1970   °[facility] [address], [place] (336) 832-4400   °[facility] [place] ° [address], [place] (336) 992-4800   °[facility]/Dr. Osei-Bonsu ° [address] [place] [address], [place] or [address], [place] (336) [phone] Phone number for both [place] and [place] locations is the same.  °[facility] [address], [place] (336) 299-0000   °[facility] [address] [place] [address], [place] or [address] (336) 852-7530 °(336) 878-2260   °[facility] [address], [place] (336) 350-1642, phone; (336) 294-5005, fax Sees patients 1st and 3rd Saturday of every month.  Must not qualify for public or private insurance (i.e. Medicaid, Medicare, [place] Health Choice, Veterans' Benefits) • Household income should be no more than 200% of the poverty level •The clinic cannot treat you if you are pregnant or think you are pregnant • Sexually transmitted diseases are not treated at the clinic.  ° ° °Dental Care: °Organization         Address  Phone  Notes  °[facility] [address], [place] (336) 641-6152 Accepts children up to age 21 who are enrolled in Medicaid or [place] Health Choice; pregnant women with a Medicaid card; and children who have applied for Medicaid or [place] Health Choice, but were declined, whose parents can pay a reduced fee at time of service.  °[facility] [place]  [address], [place] (336) 641-7733 Accepts children up to age 21 who are enrolled in Medicaid or [place] Health Choice; pregnant women with a Medicaid card; and children who have applied for Medicaid or [place] Health Choice, but were declined, whose parents can pay a reduced fee at time of service.  °[facility] ° [address], [place] (336) 641-4533 Patients are seen by appointment only. Walk-ins are not accepted. [facility] will see patients 18 years of age and older. °Monday - Tuesday (8am-5pm) °Most Wednesdays (8:30-5pm) °$30 per visit, cash only  °[facility] ° [address], [place] (336) [phone] Patients are seen by appointment only. Walk-ins are not accepted. [facility] will see patients 18 years of age and older. °One Wednesday Evening (Monthly: Volunteer Based).  $30 per visit, cash only  °[facility]  (919) 537-3737 for adults; Children under age 4, call Graduate Pediatric Dentistry at ([phone]) [phone]. Children aged 4-14, please call ([phone]) [phone] to request a pediatric application. ° Dental services are provided in all areas of dental care including fillings, crowns and bridges, complete and partial dentures, implants, gum treatment, root canals, and extractions. Preventive care is also provided. Treatment is provided to both adults and children. °Patients are selected via a lottery and there is often a waiting list. °  °[facility] [address], ° ° (336) 763-8833 [email or web address] °  °[facility]  [address], [place],  (336)723-1848, Ext. 123 Second and Fourth Thursday of each month, opens at 6:30 AM; Clinic ends at 9 AM.  Patients are seen on a first-come first-served basis, and   a limited number are seen during each clinic.  ° °[facility] ° [address], [place], [place] (336) 723-7904   Eligibility Requirements °You must have lived in [place], [place], or [place] counties for at least the last three months. °  You cannot be eligible for state or federal sponsored healthcare insurance, including Veterans Administration, Medicaid, or Medicare. °  You generally cannot be eligible for healthcare insurance through your employer.  °  How to apply: °Eligibility screenings are held every Tuesday and Wednesday afternoon from 1:00 pm until 4:00 pm. You do not need an appointment for the interview!  °[facility] [address], [place], [place] 336-631-2330   °[facility]  336-342-8273   °[facility]  336-703-3100   °[facility]  336-570-6415   ° °Behavioral Health Resources in the Community: °Intensive Outpatient Programs °Organization         Address  Phone  Notes  °[facility] [address], [place], [place] 336-878-6098   ° [facility] [address], [place], [place] 336-832-9800   °[facility] [address], [place], [place] ° 336-882-2125   °[facility] [address],  °[place], [place] 1-800-853-5163 or 336-641-4981   °Substance Abuse Resources °Organization         Address  Phone  Notes  °[facility]  336-882-2125   °[facility]  336-784-9470   °[facility]  336-285-9073   °[facility]  336-845-3988   °Residential & Outpatient Substance Abuse Program  1-800-659-3381   °Psychological Services °Organization         Address  Phone  Notes  °[facility]  336- 832-9600     °[facility]  336- 378-7881   °[facility] [address], [place] 1-800-853-5163 or 336-641-4981   ° °Mobile Crisis Teams °Organization         Address  Phone  Notes  °[facility], Mobile Crisis Care Unit  1-877-626-1772   °Assertive °[facility] ° [address] [place], [place] 336-834-9664   °Sharon DeEsch [address] °[place] 336-554-5454   ° °Self-Help/Support Groups °Organization         Address  Phone             Notes  °[facility] - variety of support groups  336- 373-1402 Call for more information  °Narcotics Anonymous (NA), [facility] [address], °[place]  2 meetings at this location  ° °Residential Treatment Programs °Organization         Address  Phone  Notes  °[facility] [address],    °[place]  1-866-801-8205   °[facility] ° [address], [place], [place] 704-293-8524   °[facility] [address], [place] 336-845-3988 Admissions: 8am-3pm M-F  °[facility] [address]    °[place], [place] 336-841-1104   °[facility] [address], [place], [place] 336-379-7146   °[facility] [address]  °[place], [place] 336-285-9073   °[facility] - Intensive Outpatient [address], , [place] 336-852-3033   °[facility] [address]  °[place], [place] 1-877-615-2722 or 336-784-9470   °[facility] [address] [place],  336-227-7417 Accepts Medicaid  °[facility] [address]  °[place] 1-800-659-3381 Substance Abuse/Addiction Treatment  ° °[place] Behavioral Health Resources °Organization         Address  Phone  Notes  °[facility]  (888) 581-9988   °Julie Brannon, PhD [address] [place], [place]   (336) 349-5553 or (336) 951-0000    °[facility]   [address] °[place], [place] (336) 349-4454   °[facility] [address], [place], [place] (336) 342-8316 Insurance/Medicaid/sponsorship through [facility]  °[facility] [address]                                    [place], [place] (336) 342-8316 Therapy/tele-psych/case  °[facility] [address]  ° [place], [place] (336) 349-2233    °Dr. Arfeen  (336) 349-4544   °[facility] [place]  [employer] [place] [facility] 1) [address], [place] °2) [address], [place] °3)  [address], [place] (336) 349-3220 °(336) 342-7768 ° °(336) 342-8140   °[place] [facility] (336) 342-1394 or (336) 342-3537 (After Hours)    ° ° ° °

## 2014-10-15 NOTE — ED Provider Notes (Signed)
CSN: [id number]     Arrival date & time 10/15/14  1758 History  This chart was scribed for non-physician practitioner, Fayrene Helper, PA-C, working with Linwood Dibbles, MD, by Ronney Lion, ED Scribe. This patient was seen in room WTR7/WTR7 and the patient's care was started at 6:57 PM.    Chief Complaint  Patient presents with  . Asthma   The history is provided by the patient. No language interpreter was used.    HPI Comments: Keith Weber is a 44 y.o. male who presents to the Emergency Department complaining of an asthma exacerbation. Patient was seen at the ED by me on 09/20/14, about a month ago, with a chief complaint of dental pain. At the time, he stated he also ran out of asthma medication and needed medication for GERD, both of which I prescribed him, in addition to a referral to PCP. Patient states that the inhaler I prescribed him last time (Proventil) does not work, and states he wants a prescription for Pro-Air HFA 200 MCG. Patient does not have a PCP. Patient states he is having difficulty obtaining insurance, but has an appointment tomorrow at [facility].  Past Medical History  Diagnosis Date  . Asthma   . Sciatic nerve pain   . COPD (chronic obstructive pulmonary disease)   . GERD (gastroesophageal reflux disease)    Past Surgical History  Procedure Laterality Date  . I&d extremity Right 06/16/2014    Procedure: IRRIGATION AND DEBRIDEMENT RIGHT THUMB AND FIRST FINGER AND PINNING ;  Surgeon: Dominica Severin, MD;  Location: [facility] OR;  Service: Orthopedics;  Laterality: Right;  . Irrigation and debridement knee Right 06/16/2014    Procedure: IRRIGATION AND DEBRIDEMENT POSTERIOR KNEE;  Surgeon: Dominica Severin, MD;  Location: [facility] OR;  Service: Orthopedics;  Laterality: Right;  . Laceration repair Right 06/16/2014    Procedure: REPAIR ODF THREE LACERATIONS POSTERIOR KNEE;  Surgeon: Dominica Severin, MD;  Location: [facility] OR;  Service: Orthopedics;  Laterality: Right;   No family history on  file. History  Substance Use Topics  . Smoking status: Current Some Day Smoker  . Smokeless tobacco: Not on file  . Alcohol Use: Yes     Comment: occ    Review of Systems  Respiratory: Positive for shortness of breath and wheezing.     Allergies  Shellfish allergy; Amoxicillin; Iodine; and Peanut-containing drug products  Home Medications   Prior to Admission medications   Medication Sig Start Date End Date Taking? Authorizing Provider  albuterol (PROVENTIL HFA;VENTOLIN HFA) 108 (90 BASE) MCG/ACT inhaler Inhale 1-2 puffs into the lungs every 6 (six) hours as needed for wheezing. 09/19/14   Fayrene Helper, PA-C  albuterol (PROVENTIL) (2.5 MG/3ML) 0.083% nebulizer solution Take 3 mLs (2.5 mg total) by nebulization every 6 (six) hours as needed for wheezing or shortness of breath. 10/10/14   Noel Gerold, MD  azithromycin (ZITHROMAX) 250 MG tablet Take 1 tablet (250 mg total) by mouth daily. Patient not taking: Reported on 10/09/2014 09/19/14   Fayrene Helper, PA-C  Fluticasone-Salmeterol (ADVAIR DISKUS) 250-50 MCG/DOSE AEPB Inhale 1 puff into the lungs daily. 03/15/14   Tiffany Neva Seat, PA-C  ibuprofen (ADVIL,MOTRIN) 800 MG tablet Take 1 tablet (800 mg total) by mouth every 8 (eight) hours as needed. Patient not taking: Reported on 10/09/2014 09/19/14   Fayrene Helper, PA-C  methocarbamol (ROBAXIN) 500 MG tablet Take 1 tablet (500 mg total) by mouth every 6 (six) hours as needed for muscle spasms. Patient not taking: Reported on 10/09/2014 06/18/14  Dominica Severin, MD  Multiple Vitamin (MULTIVITAMIN WITH MINERALS) TABS tablet Take 1 tablet by mouth daily.    Historical Provider, MD  omeprazole (PRILOSEC) 20 MG capsule Take 1 capsule (20 mg total) by mouth daily. Patient not taking: Reported on 10/09/2014 09/19/14   Fayrene Helper, PA-C  oxyCODONE (OXY IR/ROXICODONE) 5 MG immediate release tablet Take 1-2 tablets (5-10 mg total) by mouth every 3 (three) hours as needed for moderate pain. 06/18/14   Dominica Severin, MD   predniSONE (DELTASONE) 50 MG tablet Take 1 tablet (50 mg total) by mouth daily. 10/10/14   Noel Gerold, MD  ranitidine (ZANTAC) 150 MG capsule Take 1 capsule (150 mg total) by mouth daily. Patient not taking: Reported on 10/09/2014 09/19/14   Fayrene Helper, PA-C   BP 152/89 mmHg  Pulse 92  Temp(Src) 98.6 F (37 C) (Oral)  Resp 20  SpO2 95% Physical Exam  Constitutional: He is oriented to person, place, and time. He appears well-developed and well-nourished. No distress.  HENT:  Head: Normocephalic and atraumatic.  Eyes: Conjunctivae and EOM are normal.  Neck: Neck supple. No tracheal deviation present.  Cardiovascular: Normal rate.   Pulmonary/Chest: Effort normal. No respiratory distress. He has wheezes.  Able to speak in complete sentences.  No accessory muscle use.  Both inspiratory and expiratory wheezes heard. No rales or rhonchi.    Musculoskeletal: Normal range of motion.  Neurological: He is alert and oriented to person, place, and time.  Skin: Skin is warm and dry.  Psychiatric: He has a normal mood and affect. His behavior is normal.  Nursing note and vitals reviewed.   ED Course  Procedures (including critical care time)  DIAGNOSTIC STUDIES: Oxygen Saturation is 95% on room air, adequate by my interpretation.    COORDINATION OF CARE: 7:09 PM - Discussed treatment plan with pt at bedside which includes a prescription for Pro-Air HFA  200 MCG and steroids, and pt agreed to plan.  8:11 PM Improvement of sxs after /hr continuous nebs and steroid.  Pt ambulate maintaining >96% on RA, speak in complete sentences.  Pt request one additional cont treatment, will provide.  He has scheduled to f/u with [facility] tomorrow for further care.  Care discussed with oncoming provider who will d/c pt pending breathing treatment.       Labs Review Labs Reviewed - No data to display  Imaging Review Dg Chest 2 View  10/15/2014   CLINICAL DATA:  Acute on chronic asthma attack.   Smoker.  EXAM: CHEST  2 VIEW  COMPARISON:  PA and lateral chest 10/09/2014 and 09/05/2013.  FINDINGS: The chest is hyperexpanded with attenuation of the pulmonary vasculature. Lungs are clear. Heart size is normal. No pneumothorax or pleural effusion.  IMPRESSION: Emphysema without acute disease.   Electronically Signed   By: Drusilla Kanner M.D.   On: 10/15/2014 18:44     EKG Interpretation None      MDM   Final diagnoses:  Asthma exacerbation attacks, moderate persistent   BP 152/89 mmHg  Pulse 92  Temp(Src) 98.6 F (37 C) (Oral)  Resp 20  SpO2 95%  I have reviewed nursing notes and vital signs. I personally reviewed the imaging tests through PACS system  I reviewed available ER/hospitalization records thought the EMR  I personally performed the services described in this documentation, which was scribed in my presence. The recorded information has been reviewed and is accurate.      Fayrene Helper, PA-C 10/15/14 2013  Linwood Dibbles,  MD 10/15/14 2026

## 2014-10-16 ENCOUNTER — Ambulatory Visit: Payer: PRIVATE HEALTH INSURANCE | Attending: Internal Medicine | Admitting: Family Medicine

## 2014-10-16 DIAGNOSIS — J45909 Unspecified asthma, uncomplicated: Secondary | ICD-10-CM | POA: Insufficient documentation

## 2014-10-16 DIAGNOSIS — J4541 Moderate persistent asthma with (acute) exacerbation: Secondary | ICD-10-CM | POA: Insufficient documentation

## 2014-10-16 NOTE — Patient Instructions (Signed)
Use medications prescribe in ED and filled here.  Follow-up here for further flares if we are openAsthma Attack Prevention Although there is no way to prevent asthma from starting, you can take steps to control the disease and reduce its symptoms. Learn about your asthma and how to control it. Take an active role to control your asthma by working with your health care provider to create and follow an asthma action plan. An asthma action plan guides you in:  Taking your medicines properly.  Avoiding things that set off your asthma or make your asthma worse (asthma triggers).  Tracking your level of asthma control.  Responding to worsening asthma.  Seeking emergency care when needed. To track your asthma, keep records of your symptoms, check your peak flow number using a handheld device that shows how well air moves out of your lungs (peak flow meter), and get regular asthma checkups.  WHAT ARE SOME WAYS TO PREVENT AN ASTHMA ATTACK?  Take medicines as directed by your health care provider.  Keep track of your asthma symptoms and level of control.  With your health care provider, write a detailed plan for taking medicines and managing an asthma attack. Then be sure to follow your action plan. Asthma is an ongoing condition that needs regular monitoring and treatment.  Identify and avoid asthma triggers. Many outdoor allergens and irritants (such as pollen, mold, cold air, and air pollution) can trigger asthma attacks. Find out what your asthma triggers are and take steps to avoid them.  Monitor your breathing. Learn to recognize warning signs of an attack, such as coughing, wheezing, or shortness of breath. Your lung function may decrease before you notice any signs or symptoms, so regularly measure and record your peak airflow with a home peak flow meter.  Identify and treat attacks early. If you act quickly, you are less likely to have a severe attack. You will also need less medicine to  control your symptoms. When your peak flow measurements decrease and alert you to an upcoming attack, take your medicine as instructed and immediately stop any activity that may have triggered the attack. If your symptoms do not improve, get medical help.  Pay attention to increasing quick-relief inhaler use. If you find yourself relying on your quick-relief inhaler, your asthma is not under control. See your health care provider about adjusting your treatment. WHAT CAN MAKE MY SYMPTOMS WORSE? A number of common things can set off or make your asthma symptoms worse and cause temporary increased inflammation of your airways. Keep track of your asthma symptoms for several weeks, detailing all the environmental and emotional factors that are linked with your asthma. When you have an asthma attack, go back to your asthma diary to see which factor, or combination of factors, might have contributed to it. Once you know what these factors are, you can take steps to control many of them. If you have allergies and asthma, it is important to take asthma prevention steps at home. Minimizing contact with the substance to which you are allergic will help prevent an asthma attack. Some triggers and ways to avoid these triggers are: Animal Dander:  Some people are allergic to the flakes of skin or dried saliva from animals with fur or feathers.   There is no such thing as a hypoallergenic dog or cat breed. All dogs or cats can cause allergies, even if they don't shed.  Keep these pets out of your home.  If you are not able to  keep a pet outdoors, keep the pet out of your bedroom and other sleeping areas at all times, and keep the door closed.  Remove carpets and furniture covered with cloth from your home. If that is not possible, keep the pet away from fabric-covered furniture and carpets. Dust Mites: Many people with asthma are allergic to dust mites. Dust mites are tiny bugs that are found in every home in  mattresses, pillows, carpets, fabric-covered furniture, bedcovers, clothes, stuffed toys, and other fabric-covered items.   Cover your mattress in a special dust-proof cover.  Cover your pillow in a special dust-proof cover, or wash the pillow each week in hot water. Water must be hotter than 130 F (54.4 C) to kill dust mites. Cold or warm water used with detergent and bleach can also be effective.  Wash the sheets and blankets on your bed each week in hot water.  Try not to sleep or lie on cloth-covered cushions.  Call ahead when traveling and ask for a smoke-free hotel room. Bring your own bedding and pillows in case the hotel only supplies feather pillows and down comforters, which may contain dust mites and cause asthma symptoms.  Remove carpets from your bedroom and those laid on concrete, if you can.  Keep stuffed toys out of the bed, or wash the toys weekly in hot water or cooler water with detergent and bleach. Cockroaches: Many people with asthma are allergic to the droppings and remains of cockroaches.   Keep food and garbage in closed containers. Never leave food out.  Use poison baits, traps, powders, gels, or paste (for example, boric acid).  If a spray is used to kill cockroaches, stay out of the room until the odor goes away. Indoor Mold:  Fix leaky faucets, pipes, or other sources of water that have mold around them.  Clean floors and moldy surfaces with a fungicide or diluted bleach.  Avoid using humidifiers, vaporizers, or swamp coolers. These can spread molds through the air. Pollen and Outdoor Mold:  When pollen or mold spore counts are high, try to keep your windows closed.  Stay indoors with windows closed from late morning to afternoon. Pollen and some mold spore counts are highest at that time.  Ask your health care provider whether you need to take anti-inflammatory medicine or increase your dose of the medicine before your allergy season starts. Other  Irritants to Avoid:  Tobacco smoke is an irritant. If you smoke, ask your health care provider how you can quit. Ask family members to quit smoking, too. Do not allow smoking in your home or car.  If possible, do not use a wood-burning stove, kerosene heater, or fireplace. Minimize exposure to all sources of smoke, including incense, candles, fires, and fireworks.  Try to stay away from strong odors and sprays, such as perfume, talcum powder, hair spray, and paints.  Decrease humidity in your home and use an indoor air cleaning device. Reduce indoor humidity to below 60%. Dehumidifiers or central air conditioners can do this.  Decrease house dust exposure by changing furnace and air cooler filters frequently.  Try to have someone else vacuum for you once or twice a week. Stay out of rooms while they are being vacuumed and for a short while afterward.  If you vacuum, use a dust mask from a hardware store, a double-layered or microfilter vacuum cleaner bag, or a vacuum cleaner with a HEPA filter.  Sulfites in foods and beverages can be irritants. Do not drink beer  or wine or eat dried fruit, processed potatoes, or shrimp if they cause asthma symptoms.  Cold air can trigger an asthma attack. Cover your nose and mouth with a scarf on cold or windy days.  Several health conditions can make asthma more difficult to manage, including a runny nose, sinus infections, reflux disease, psychological stress, and sleep apnea. Work with your health care provider to manage these conditions.  Avoid close contact with people who have a respiratory infection such as a cold or the flu, since your asthma symptoms may get worse if you catch the infection. Wash your hands thoroughly after touching items that may have been handled by people with a respiratory infection.  Get a flu shot every year to protect against the flu virus, which often makes asthma worse for days or weeks. Also get a pneumonia shot if you have  not previously had one. Unlike the flu shot, the pneumonia shot does not need to be given yearly. Medicines:  Talk to your health care provider about whether it is safe for you to take aspirin or non-steroidal anti-inflammatory medicines (NSAIDs). In a small number of people with asthma, aspirin and NSAIDs can cause asthma attacks. These medicines must be avoided by people who have known aspirin-sensitive asthma. It is important that people with aspirin-sensitive asthma read labels of all over-the-counter medicines used to treat pain, colds, coughs, and fever.  Beta-blockers and ACE inhibitors are other medicines you should discuss with your health care provider. HOW CAN I FIND OUT WHAT I AM ALLERGIC TO? Ask your asthma health care provider about allergy skin testing or blood testing (the RAST test) to identify the allergens to which you are sensitive. If you are found to have allergies, the most important thing to do is to try to avoid exposure to any allergens that you are sensitive to as much as possible. Other treatments for allergies, such as medicines and allergy shots (immunotherapy) are available.  CAN I EXERCISE? Follow your health care provider's advice regarding asthma treatment before exercising. It is important to maintain a regular exercise program, but vigorous exercise or exercise in cold, humid, or dry environments can cause asthma attacks, especially for those people who have exercise-induced asthma. Document Released: 07/15/2009 Document Revised: 08/01/2013 Document Reviewed: 02/01/2013 [facility] Patient Information 2015 [place], [place]. This information is not intended to replace advice given to you by your health care provider. Make sure you discuss any questions you have with your health care provider.

## 2014-10-16 NOTE — Progress Notes (Signed)
Patient still complains of SOB he needs to pick up his albuterol and his prednisone that he was rx in ED His sats today are 98%  Patient presented to the emergency department for asthma exacerbation. He was given a continuous nebulizer treatment after which time he ambulated without hypoxia; heart rate remained in NSR. Plan discussed with Ardelle Parkran, PA-C which includes discharge following second continuous albuterol neb which patient requested despite improvement in symptoms. Following this treatment, patient was found to be satting at 100% on room air. No indication for additional workup at this time. Patient discharged in good condition. Return precautions given. He has PCP f/u scheduled for tomorrow.

## 2014-10-17 ENCOUNTER — Ambulatory Visit: Payer: PRIVATE HEALTH INSURANCE

## 2014-10-18 ENCOUNTER — Ambulatory Visit: Payer: PRIVATE HEALTH INSURANCE

## 2014-10-29 ENCOUNTER — Ambulatory Visit: Payer: PRIVATE HEALTH INSURANCE | Admitting: Family Medicine

## 2014-11-08 ENCOUNTER — Ambulatory Visit: Payer: PRIVATE HEALTH INSURANCE

## 2014-11-26 ENCOUNTER — Encounter (HOSPITAL_COMMUNITY): Payer: Self-pay | Admitting: Emergency Medicine

## 2014-11-26 ENCOUNTER — Emergency Department (HOSPITAL_COMMUNITY)
Admission: EM | Admit: 2014-11-26 | Discharge: 2014-11-26 | Disposition: A | Discharge status: Home / Self Care | Payer: PRIVATE HEALTH INSURANCE | Attending: Emergency Medicine | Admitting: Emergency Medicine

## 2014-11-26 DIAGNOSIS — Z8739 Personal history of other diseases of the musculoskeletal system and connective tissue: Secondary | ICD-10-CM | POA: Insufficient documentation

## 2014-11-26 DIAGNOSIS — J449 Chronic obstructive pulmonary disease, unspecified: Secondary | ICD-10-CM | POA: Insufficient documentation

## 2014-11-26 DIAGNOSIS — Z7952 Long term (current) use of systemic steroids: Secondary | ICD-10-CM | POA: Insufficient documentation

## 2014-11-26 DIAGNOSIS — Z79899 Other long term (current) drug therapy: Secondary | ICD-10-CM | POA: Insufficient documentation

## 2014-11-26 DIAGNOSIS — Z792 Long term (current) use of antibiotics: Secondary | ICD-10-CM | POA: Insufficient documentation

## 2014-11-26 DIAGNOSIS — Z87891 Personal history of nicotine dependence: Secondary | ICD-10-CM | POA: Insufficient documentation

## 2014-11-26 DIAGNOSIS — Z7951 Long term (current) use of inhaled steroids: Secondary | ICD-10-CM | POA: Insufficient documentation

## 2014-11-26 DIAGNOSIS — L237 Allergic contact dermatitis due to plants, except food: Secondary | ICD-10-CM | POA: Insufficient documentation

## 2014-11-26 DIAGNOSIS — K219 Gastro-esophageal reflux disease without esophagitis: Secondary | ICD-10-CM | POA: Insufficient documentation

## 2014-11-26 DIAGNOSIS — Z880 Allergy status to penicillin: Secondary | ICD-10-CM | POA: Insufficient documentation

## 2014-11-26 MED ORDER — ALBUTEROL SULFATE HFA 108 (90 BASE) MCG/ACT IN AERS: 1.0000 | INHALATION_SPRAY | Freq: Four times a day (QID) | RESPIRATORY_TRACT | Status: DC | PRN

## 2014-11-26 MED ORDER — DIPHENHYDRAMINE HCL 25 MG PO TABS: 25.0000 mg | ORAL_TABLET | Freq: Four times a day (QID) | ORAL | Status: DC

## 2014-11-26 MED ORDER — HYDROCORTISONE 2.5 % EX LOTN: TOPICAL_LOTION | Freq: Two times a day (BID) | CUTANEOUS | Status: DC

## 2014-11-26 MED ORDER — HYDROCORTISONE 1 % EX LOTN
TOPICAL_LOTION | Freq: Once | CUTANEOUS | Status: AC
  Administered 2014-11-26: 22:00:00 via TOPICAL
  Filled 2014-11-26: qty 118

## 2014-11-26 MED ORDER — PREDNISONE 10 MG PO TABS: ORAL_TABLET | ORAL | Status: DC

## 2014-11-26 NOTE — ED Notes (Signed)
Pt still waiting for hydrocortisone cream from pharmacy.  Called pharmacy to verify.  Reason for delay explained to pt.

## 2014-11-26 NOTE — ED Notes (Signed)
Pt reports rash on both arms x1 week. Pt reports rash is spreading.

## 2014-11-26 NOTE — ED Notes (Signed)
Pt reports itchy rash on bila arms and hands x 1 weeks.  Was working out in his yard prior to noting rash

## 2014-11-26 NOTE — ED Provider Notes (Signed)
CSN: [id number]     Arrival date & time 11/26/14  1921 History  This chart was scribed for Keith Anis, PA-C, working with [name], MD by Elon Spanner, ED Scribe. This patient was seen in room WTR7/WTR7 and the patient's care was started at 9:08 PM.   Chief Complaint  Patient presents with  . Rash   The history is provided by the patient. No language interpreter was used.   HPI Comments: Keith Weber is a 44 y.o. male who presents to the Emergency Department complaining of a spreading, itching, seaping rash on his bilateral arms and legs onset 1 week ago.  Patient has used alcohol swabs and Benadryl.  Patient denies any chronic medical conditions other than asthma which is treated with an albuterol inhaler.  Patient denies a history of asthma  Past Medical History  Diagnosis Date  . Asthma   . Sciatic nerve pain   . COPD (chronic obstructive pulmonary disease)   . GERD (gastroesophageal reflux disease)    Past Surgical History  Procedure Laterality Date  . I&d extremity Right 06/16/2014    Procedure: IRRIGATION AND DEBRIDEMENT RIGHT THUMB AND FIRST FINGER AND PINNING ;  Surgeon: Dominica Severin, MD;  Location: [facility] OR;  Service: Orthopedics;  Laterality: Right;  . Irrigation and debridement knee Right 06/16/2014    Procedure: IRRIGATION AND DEBRIDEMENT POSTERIOR KNEE;  Surgeon: Dominica Severin, MD;  Location: [facility] OR;  Service: Orthopedics;  Laterality: Right;  . Laceration repair Right 06/16/2014    Procedure: REPAIR ODF THREE LACERATIONS POSTERIOR KNEE;  Surgeon: Dominica Severin, MD;  Location: [facility] OR;  Service: Orthopedics;  Laterality: Right;   Family History  Problem Relation Age of Onset  . Family history unknown: Yes   History  Substance Use Topics  . Smoking status: Former Smoker    Quit date: 10/09/2014  . Smokeless tobacco: Not on file  . Alcohol Use: Yes     Comment: occ    Review of Systems  Skin: Positive for rash.      Allergies  Shellfish allergy;  Amoxicillin; Iodine; and Peanut-containing drug products  Home Medications   Prior to Admission medications   Medication Sig Start Date End Date Taking? Authorizing Provider  albuterol (PROVENTIL) (2.5 MG/3ML) 0.083% nebulizer solution Take 3 mLs (2.5 mg total) by nebulization every 6 (six) hours as needed for wheezing or shortness of breath. 10/15/14  Yes Fayrene Helper, PA-C  Fluticasone-Salmeterol (ADVAIR DISKUS) 250-50 MCG/DOSE AEPB Inhale 1 puff into the lungs 2 (two) times daily. 10/15/14  Yes Fayrene Helper, PA-C  Multiple Vitamin (MULTIVITAMIN WITH MINERALS) TABS tablet Take 1 tablet by mouth daily.   Yes Historical Provider, MD  azithromycin (ZITHROMAX) 250 MG tablet Take 1 tablet (250 mg total) by mouth daily. Patient not taking: Reported on 10/09/2014 09/19/14   Fayrene Helper, PA-C  ibuprofen (ADVIL,MOTRIN) 800 MG tablet Take 1 tablet (800 mg total) by mouth every 8 (eight) hours as needed. Patient not taking: Reported on 10/09/2014 09/19/14   Fayrene Helper, PA-C  methocarbamol (ROBAXIN) 500 MG tablet Take 1 tablet (500 mg total) by mouth every 6 (six) hours as needed for muscle spasms. Patient not taking: Reported on 10/09/2014 06/18/14   Dominica Severin, MD  omeprazole (PRILOSEC) 20 MG capsule Take 1 capsule (20 mg total) by mouth daily. Patient not taking: Reported on 10/09/2014 09/19/14   Fayrene Helper, PA-C  oxyCODONE (OXY IR/ROXICODONE) 5 MG immediate release tablet Take 1-2 tablets (5-10 mg total) by mouth every 3 (three) hours as  needed for moderate pain. Patient not taking: Reported on 10/16/2014 06/18/14   Dominica SeverinWilliam Gramig, MD  predniSONE (DELTASONE) 50 MG tablet Take 1 tablet (50 mg total) by mouth daily. Patient not taking: Reported on 11/26/2014 10/15/14   Fayrene HelperBowie Tran, PA-C  ranitidine (ZANTAC) 150 MG capsule Take 1 capsule (150 mg total) by mouth daily. Patient not taking: Reported on 10/09/2014 09/19/14   Fayrene HelperBowie Tran, PA-C   There were no vitals taken for this visit. Physical Exam  Constitutional: He is  oriented to person, place, and time. He appears well-developed and well-nourished. No distress.  HENT:  Head: Normocephalic and atraumatic.  Eyes: Conjunctivae and EOM are normal.  Neck: Neck supple. No tracheal deviation present.  Cardiovascular: Normal rate.   Pulmonary/Chest: Effort normal. No respiratory distress.  Musculoskeletal: Normal range of motion.  Neurological: He is alert and oriented to person, place, and time.  Skin: Skin is warm and dry.  Maculopapular rash confluent into patches on arms and legs bilaterally consistent with contact dermititis   Psychiatric: He has a normal mood and affect. His behavior is normal.  Nursing note and vitals reviewed.   ED Course  Procedures (including critical care time)  DIAGNOSTIC STUDIES: Oxygen Saturation is 95% on room air, normal by my interpretation.    COORDINATION OF CARE:    Labs Review Labs Reviewed - No data to display  Imaging Review No results found.   EKG Interpretation None      MDM   Final diagnoses:  None    1. Contact dermatitis  Uncomplicated rash from contact with poison ivy.   I personally performed the services described in this documentation, which was scribed in my presence. The recorded information has been reviewed and is accurate.     Keith AnisShari Kenzo Ozment, PA-C 11/27/14 [id number]  Pricilla LovelessScott Goldston, MD 12/01/14 [id number]

## 2014-11-26 NOTE — Discharge Instructions (Signed)

## 2015-01-15 ENCOUNTER — Emergency Department (HOSPITAL_COMMUNITY)
Admission: EM | Admit: 2015-01-15 | Discharge: 2015-01-15 | Disposition: A | Discharge status: Home / Self Care | Payer: Self-pay | Attending: Emergency Medicine | Admitting: Emergency Medicine

## 2015-01-15 ENCOUNTER — Emergency Department (HOSPITAL_COMMUNITY): Payer: PRIVATE HEALTH INSURANCE

## 2015-01-15 ENCOUNTER — Encounter (HOSPITAL_COMMUNITY): Payer: Self-pay | Admitting: *Deleted

## 2015-01-15 DIAGNOSIS — J45901 Unspecified asthma with (acute) exacerbation: Secondary | ICD-10-CM

## 2015-01-15 DIAGNOSIS — Z87891 Personal history of nicotine dependence: Secondary | ICD-10-CM | POA: Insufficient documentation

## 2015-01-15 DIAGNOSIS — L237 Allergic contact dermatitis due to plants, except food: Secondary | ICD-10-CM | POA: Insufficient documentation

## 2015-01-15 DIAGNOSIS — M5430 Sciatica, unspecified side: Secondary | ICD-10-CM | POA: Insufficient documentation

## 2015-01-15 DIAGNOSIS — Z760 Encounter for issue of repeat prescription: Secondary | ICD-10-CM | POA: Insufficient documentation

## 2015-01-15 DIAGNOSIS — J441 Chronic obstructive pulmonary disease with (acute) exacerbation: Secondary | ICD-10-CM | POA: Insufficient documentation

## 2015-01-15 DIAGNOSIS — Z880 Allergy status to penicillin: Secondary | ICD-10-CM | POA: Insufficient documentation

## 2015-01-15 DIAGNOSIS — K219 Gastro-esophageal reflux disease without esophagitis: Secondary | ICD-10-CM | POA: Insufficient documentation

## 2015-01-15 DIAGNOSIS — K088 Other specified disorders of teeth and supporting structures: Secondary | ICD-10-CM | POA: Insufficient documentation

## 2015-01-15 DIAGNOSIS — K029 Dental caries, unspecified: Secondary | ICD-10-CM | POA: Insufficient documentation

## 2015-01-15 DIAGNOSIS — Z79899 Other long term (current) drug therapy: Secondary | ICD-10-CM | POA: Insufficient documentation

## 2015-01-15 MED ORDER — ALBUTEROL SULFATE (2.5 MG/3ML) 0.083% IN NEBU: 2.5000 mg | INHALATION_SOLUTION | Freq: Four times a day (QID) | RESPIRATORY_TRACT | Status: DC | PRN

## 2015-01-15 MED ORDER — ALBUTEROL SULFATE HFA 108 (90 BASE) MCG/ACT IN AERS
2.0000 | INHALATION_SPRAY | RESPIRATORY_TRACT | Status: DC | PRN
  Administered 2015-01-15: 2 via RESPIRATORY_TRACT
  Filled 2015-01-15: qty 6.7

## 2015-01-15 MED ORDER — OXYCODONE-ACETAMINOPHEN 5-325 MG PO TABS
1.0000 | ORAL_TABLET | Freq: Once | ORAL | Status: AC
Start: 2015-01-15 — End: 2015-01-15
  Administered 2015-01-15: 1 via ORAL
  Filled 2015-01-15: qty 1

## 2015-01-15 MED ORDER — TRAMADOL HCL 50 MG PO TABS: 50.0000 mg | ORAL_TABLET | Freq: Four times a day (QID) | ORAL | Status: DC | PRN

## 2015-01-15 MED ORDER — NAPROXEN 500 MG PO TABS: 500.0000 mg | ORAL_TABLET | Freq: Two times a day (BID) | ORAL | Status: DC

## 2015-01-15 MED ORDER — HYDROCORTISONE 1 % EX CREA: TOPICAL_CREAM | CUTANEOUS | Status: DC

## 2015-01-15 MED ORDER — CLINDAMYCIN HCL 150 MG PO CAPS: 300.0000 mg | ORAL_CAPSULE | Freq: Three times a day (TID) | ORAL | Status: DC

## 2015-01-15 MED ORDER — IPRATROPIUM-ALBUTEROL 0.5-2.5 (3) MG/3ML IN SOLN
3.0000 mL | Freq: Once | RESPIRATORY_TRACT | Status: AC
  Administered 2015-01-15: 3 mL via RESPIRATORY_TRACT
  Filled 2015-01-15: qty 3

## 2015-01-15 NOTE — ED Notes (Signed)
Pt is requesting pain medication for his dental pain and is upset that nothing has been ordered for his pain yet and is demanding to speak with someone in charge. I informed patient that I would be sure to let the NP know he is requesting pain medication and he said this "hopspital is a joke." Pt becoming very upset but this RN encouraged patient that I am doing everything I can to get his pain medication and I also informed patient that we were more concerned about his breathing issues and wanted to take care of that first. Pt states he understands. [facility] informed that patient is requesting pain medication.

## 2015-01-15 NOTE — ED Notes (Signed)
Pt was given a prescription for Tramadol but patient refused and states he did not want that particular medication and wants percocet. I informed him about the policy regarding narcotic use in dental pain. Pt is very upset about the fact that narcotics are not to be prescribed for uncomplicated dental pain. I informed the patient that the policy states that opioids are not to be prescribed for dental pain and Kerrie BuffaloHope Neese, NP gave pt a copy of the policy that states this. Pt continued to state that "this hospital is a joke". Pt is very unhappy and states he will find more answers about this and he then accused this RN of withholding information from him but I assured him that I am not. I also provided patient with the emergency dental care information sheet and encouraged patient to follow up with this dentist or any dentist listed on the resource guide printed for him by [facility] Pcope N, NP. Pt observed walking out of ED with steady gait, refusing to sign d/c paperwork.

## 2015-01-15 NOTE — Discharge Instructions (Signed)
°Emergency Department Resource Guide °1) Find a Doctor and Pay Out of Pocket °Although you won't have to find out who is covered by your insurance plan, it is a good idea to ask around and get recommendations. You will then need to call the office and see if the doctor you have chosen will accept you as a new patient and what types of options they offer for patients who are self-pay. Some doctors offer discounts or will set up payment plans for their patients who do not have insurance, but you will need to ask so you aren't surprised when you get to your appointment. ° °2) Contact Your Local Health Department °Not all health departments have doctors that can see patients for sick visits, but many do, so it is worth a call to see if yours does. If you don't know where your local health department is, you can check in your phone book. The CDC also has a tool to help you locate your state's health department, and many state websites also have listings of all of their local health departments. ° °3) Find a Walk-in Clinic °If your illness is not likely to be very severe or complicated, you may want to try a walk in clinic. These are popping up all over the country in pharmacies, drugstores, and shopping centers. They're usually staffed by nurse practitioners or physician assistants that have been trained to treat common illnesses and complaints. They're usually fairly quick and inexpensive. However, if you have serious medical issues or chronic medical problems, these are probably not your best option. ° °No Primary Care Doctor: °- Call [facility] at  832-8000 - they can help you locate a primary care doctor that  accepts your insurance, provides certain services, etc. °- Physician Referral Service- 1-800-533-3463 ° °Chronic Pain Problems: °Organization         Address  Phone   Notes  °[facility]  ([phone]) [phone] Patients need to be referred by their primary care doctor.  ° °Medication  Assistance: °Organization         Address  Phone   Notes  °[place] [facility] [address] °[place], [place] [address] (336) 641-8030 --Must be a resident of [place] °-- Must have NO insurance coverage whatsoever (no Medicaid/ Medicare, etc.) °-- The pt. MUST have a primary care doctor that directs their care regularly and follows them in the community °  °[facility]  (866) 331-1348   °[employer]  (888) 892-1162   ° °Agencies that provide inexpensive medical care: °Organization         Address  Phone   Notes  °[facility]  (336) 832-8035   °[facility]    (336) 832-7272   °[facility] [address] °[place], [place] [address] (336) 832-4777   °[facility] [address], °[place] (336) 271-4999   °[facility]    (336) 373-0678   °[facility]    (336) 272-1050   °[facility] ° [address], [place] Phone:  (336) 832-4444, Fax:  (336) 832-4440 Hours of Operation:  9 am - 6 pm, M-F.  Also accepts Medicaid/Medicare and self-pay.  °[facility] ° [address], [place] Phone: (336) [phone], Fax: (336) [phone]. Hours of Operation:  8:30 am - 5:30 pm, M-F.  Also accepts Medicaid and self-pay.  °[facility] [place] [address], [place] Phone: (336) 878-6027   °[facility] [address], [place], [place] (336)723-1848, Ext. 123 Mondays & Thursdays: 7-9 AM.  First 15 patients are seen on a first come, first serve basis. °  ° °Medicaid-accepting [place] Providers: ° °Organization         Address  Phone   Notes  °[facility] [address], [place] (336) [phone] Also accepts self-pay patients.  °[facility] [address], [place] ° (336) 856-9996   °[facility] [address], [place]  (336) 288-8857   °[facility] [address] [place] [address], [place] (336) 299-7000   °Veita Bland [address], [place]  ° (336) 373-1557 Only accepts [place] Access Medicaid patients after they have their name applied to their card.  ° °Self-Pay (no insurance) in [place]: ° °Organization         Address  Phone   Notes  °Sickle Cell Patients, [facility] [address], [place] (336) 832-1970   °[facility] [address], [place] (336) 832-4400   °[facility] ° [address], [place] (336) 992-4800   °[facility]/Dr. Osei-Bonsu ° [address] [place] [address], [place] or [address], [place] (336) [phone] Phone number for both [place] and [place] locations is the same.  °[facility] [address], [place] (336) 299-0000   °[facility] [address] [place] [address], [place] or [address] (336) 852-7530 °(336) 878-2260   °[facility] [address], [place] (336) 350-1642, phone; (336) 294-5005, fax Sees patients 1st and 3rd Saturday of every month.  Must not qualify for public or private insurance (i.e. Medicaid, Medicare, [place] Health Choice, Veterans' Benefits) • Household income should be no more than 200% of the poverty level •The clinic cannot treat you if you are pregnant or think you are pregnant • Sexually transmitted diseases are not treated at the clinic.  ° ° °Dental Care: °Organization         Address  Phone  Notes  °[place] [facility] [address], [place] (336) 641-6152 Accepts children up to age 21 who are enrolled in Medicaid or [place] Health Choice; pregnant women with a Medicaid card; and children who have applied for Medicaid or [place] Health Choice, but were declined, whose parents can pay a reduced fee at time of service.  °[place]  [facility] [place]  [address], [place] (336) 641-7733 Accepts children up to age 21 who are enrolled in Medicaid or [place] Health Choice; pregnant women with a Medicaid card; and children who have applied for Medicaid or [place] Health Choice, but were declined, whose parents can pay a reduced fee at time of service.  °[facility] ° [address], [place] (336) 641-4533 Patients are seen by appointment only. Walk-ins are not accepted. [facility] will see patients 18 years of age and older. °Monday - Tuesday (8am-5pm) °Most Wednesdays (8:30-5pm) °$30 per visit, cash only  °[facility] ° [address], [place] (336) 641-4533 Patients are seen by appointment only. Walk-ins are not accepted. [facility] will see patients 18 years of age and older. °One   Wednesday Evening (Monthly: Volunteer Based).  $30 per visit, cash only  °[facility]  (919) [phone] for adults; Children under age 4, call Graduate Pediatric Dentistry at ([phone]) [phone]. Children aged 4-14, please call ([phone]) [phone] to request a pediatric application. ° Dental services are provided in all areas of dental care including fillings, crowns and bridges, complete and partial dentures, implants, gum treatment, root canals, and extractions. Preventive care is also provided. Treatment is provided to both adults and children. °Patients are selected via a lottery and there is often a waiting list. °  °[facility] [address], °[place] ° (336) 763-8833 [email or web address] °  °[facility] [address], [place], [place] (336)723-1848, Ext. 123 Second and Fourth Thursday of each month, opens at 6:30 AM; Clinic ends at 9 AM.  Patients are seen on a first-come first-served basis, and a limited number are seen during each clinic.  ° °[facility] ° [address], [place], [place] (336) [phone]    Eligibility Requirements °You must have lived in [place], or [place] counties for at least the last three months. °  You cannot be eligible for state or federal sponsored healthcare insurance, including Veterans Administration, Medicaid, or Medicare. °  You generally cannot be eligible for healthcare insurance through your employer.  °  How to apply: °Eligibility screenings are held every Tuesday and Wednesday afternoon from 1:00 pm until 4:00 pm. You do not need an appointment for the interview!  °[facility] [address], [place], [place] 336-631-2330   °[facility]  336-342-8273   °[facility]  336-703-3100   °[facility]  336-570-6415   ° °Behavioral Health Resources in the Community: °Intensive Outpatient Programs °Organization         Address  Phone  Notes  °[facility] [address], [place], [place] 336-878-6098   °[facility] [address], [place], [place] 336-832-9800   °[facility] [address], [place], [place] ° 336-882-2125   °[facility] [address],  °[place], [place] 1-800-853-5163 or 336-641-4981   °Substance Abuse Resources °Organization         Address  Phone  Notes  °[facility]  336-882-2125   °[facility]  336-784-9470   °[facility]  336-285-9073   °[facility]  336-845-3988   °Residential & Outpatient Substance Abuse Program  1-800-659-3381   °Psychological Services °Organization         Address  Phone  Notes  °[facility]  336- 832-9600   °[facility]  336- 378-7881   °[facility] [address], [place] 1-800-853-5163 or 336-641-4981   ° °Mobile Crisis Teams °Organization         Address  Phone  Notes  °[facility], Mobile Crisis Care Unit  1-877-626-1772   °Assertive °[facility] ° [address]  [place], [place] 336-834-9664   °Sharon DeEsch [address] °[place] 336-554-5454   ° °Self-Help/Support Groups °Organization         Address  Phone             Notes  °[facility]  - variety of support groups  336- 373-1402 Call for more information  °Narcotics Anonymous (NA), [facility] [address], °[place]  2 meetings at this location  ° °  Residential Treatment Programs Organization         Address  Phone  Notes  [facility] [address],    [place]  1-610-960-4540   [facility]  [address], [place] [address], [place], [place] [phone]   [facility] [address] [place], [place] [phone] Admissions: 8am-3pm M-F  [facility] [address],    [place], [place] 295-284-1324   [facility] [address] [place], [place], [place] 401-027-2536   [facility] [address],  [place], [place] [phone]   [facility] Intensive Outpatient [address], [place], [place] 956-387-5643   [facility] ([facility] [address] [place]  [place], [place] 3-295-188-4166 or [phone]   [facility] ([facility] [address] [place], [place] 323-557-3220 Accepts Medicaid  [facility] [address],  [place] 2-542-706-2376 Substance Abuse/Addiction Treatment   [facility]         Address  Phone  Notes  [facility]  458-247-0405   Angie Fava, PhD [address] [place], [place]   7571408828 or (720)757-7575   [facility]   [address] [place], [place] (984) 115-4485   [facility] [address] [place], [place] 772-072-8766 Insurance/Medicaid/sponsorship through [facility] [address]                                    [place], [place] [phone] Therapy/tele-psych/case    [facility] [address], [place] [phone]    Dr. Lolly Mustache  [phone]   [facility] [place]  [facility] 1) [address], [place] 2) [address] [place] [address], [place] 3)  [address], [place] [phone] 4322624924  [phone]   [facility] Child Abuse Hotline 424-755-4240 or 9736987226 (After Hours)       WHAT ARE SOME WAYS TO PREVENT AN ASTHMA ATTACK?  Take medicines as directed by your health care provider.  Keep track of your asthma symptoms and level of control.  With your health care provider, write a detailed plan for taking medicines and managing an asthma attack. Then be sure to follow your action plan. Asthma is an ongoing condition that needs regular monitoring and treatment.  Identify and avoid asthma triggers. Many outdoor allergens and irritants (such as pollen, mold, cold air, and air pollution) can trigger asthma attacks. Find out what your asthma triggers are and take steps to avoid them.  Monitor your breathing. Learn to recognize warning signs of an attack, such as coughing, wheezing, or shortness of breath. Your lung function may decrease before you notice any signs or symptoms, so regularly measure and record your peak airflow with a home peak flow meter.  Identify and treat attacks early. If you act quickly, you are less likely to have a severe attack. You will also need less medicine to control your symptoms. When your peak flow measurements decrease and alert you to an upcoming attack, take your medicine as instructed and immediately stop any activity that may have triggered the attack. If your symptoms do not improve, get medical help.  Pay attention to increasing quick-relief inhaler use. If you find yourself relying on  your quick-relief inhaler, your asthma is not under control. See your health care provider about adjusting your treatment. WHAT CAN MAKE MY SYMPTOMS  WORSE? A number of common things can set off or make your asthma symptoms worse and cause temporary increased inflammation of your airways. Keep track of your asthma symptoms for several weeks, detailing all the environmental and emotional factors that are linked with your asthma. When you have an asthma attack, go back to your asthma diary to see which factor, or combination of factors, might have contributed to it. Once you know what these factors are, you can take steps to control many of them. If you have allergies and asthma, it is important to take asthma prevention steps at home. Minimizing contact with the substance to which you are allergic will help prevent an asthma attack. Some triggers and ways to avoid these triggers are: Animal Dander:  Some people are allergic to the flakes of skin or dried saliva from animals with fur or feathers.   There is no such thing as a hypoallergenic dog or cat breed. All dogs or cats can cause allergies, even if they don't shed.  Keep these pets out of your home.  If you are not able to keep a pet outdoors, keep the pet out of your bedroom and other sleeping areas at all times, and keep the door closed.  Remove carpets and furniture covered with cloth from your home. If that is not possible, keep the pet away from fabric-covered furniture and carpets. Dust Mites: Many people with asthma are allergic to dust mites. Dust mites are tiny bugs that are found in every home in mattresses, pillows, carpets, fabric-covered furniture, bedcovers, clothes, stuffed toys, and other fabric-covered items.   Cover your mattress in a special dust-proof cover.  Cover your pillow in a special dust-proof cover, or wash the pillow each week in hot water. Water must be hotter than 130 F (54.4 C) to kill dust mites. Cold or warm water used with detergent and bleach can also be effective.  Wash the sheets and blankets on your bed each week in hot water.  Try not to sleep or  lie on cloth-covered cushions.  Call ahead when traveling and ask for a smoke-free hotel room. Bring your own bedding and pillows in case the hotel only supplies feather pillows and down comforters, which may contain dust mites and cause asthma symptoms.  Remove carpets from your bedroom and those laid on concrete, if you can.  Keep stuffed toys out of the bed, or wash the toys weekly in hot water or cooler water with detergent and bleach. Cockroaches: Many people with asthma are allergic to the droppings and remains of cockroaches.   Keep food and garbage in closed containers. Never leave food out.  Use poison baits, traps, powders, gels, or paste (for example, boric acid).  If a spray is used to kill cockroaches, stay out of the room until the odor goes away. Indoor Mold:  Fix leaky faucets, pipes, or other sources of water that have mold around them.  Clean floors and moldy surfaces with a fungicide or diluted bleach.  Avoid using humidifiers, vaporizers, or swamp coolers. These can spread molds through the air. Pollen and Outdoor Mold:  When pollen or mold spore counts are high, try to keep your windows closed.  Stay indoors with windows closed from late morning to afternoon. Pollen and some mold spore counts are highest at that time.  Ask your health care  provider whether you need to take anti-inflammatory medicine or increase your dose of the medicine before your allergy season starts. Other Irritants to Avoid:  Tobacco smoke is an irritant. If you smoke, ask your health care provider how you can quit. Ask family members to quit smoking, too. Do not allow smoking in your home or car.  If possible, do not use a wood-burning stove, kerosene heater, or fireplace. Minimize exposure to all sources of smoke, including incense, candles, fires, and fireworks.  Try to stay away from strong odors and sprays, such as perfume, talcum powder, hair spray, and paints.  Decrease humidity  in your home and use an indoor air cleaning device. Reduce indoor humidity to below 60%. Dehumidifiers or central air conditioners can do this.  Decrease house dust exposure by changing furnace and air cooler filters frequently.  Try to have someone else vacuum for you once or twice a week. Stay out of rooms while they are being vacuumed and for a short while afterward.  If you vacuum, use a dust mask from a hardware store, a double-layered or microfilter vacuum cleaner bag, or a vacuum cleaner with a HEPA filter.  Sulfites in foods and beverages can be irritants. Do not drink beer or wine or eat dried fruit, processed potatoes, or shrimp if they cause asthma symptoms.  Cold air can trigger an asthma attack. Cover your nose and mouth with a scarf on cold or windy days.  Several health conditions can make asthma more difficult to manage, including a runny nose, sinus infections, reflux disease, psychological stress, and sleep apnea. Work with your health care provider to manage these conditions.  Avoid close contact with people who have a respiratory infection such as a cold or the flu, since your asthma symptoms may get worse if you catch the infection. Wash your hands thoroughly after touching items that may have been handled by people with a respiratory infection.  Get a flu shot every year to protect against the flu virus, which often makes asthma worse for days or weeks. Also get a pneumonia shot if you have not previously had one. Unlike the flu shot, the pneumonia shot does not need to be given yearly. Medicines:  Talk to your health care provider about whether it is safe for you to take aspirin or non-steroidal anti-inflammatory medicines (NSAIDs). In a small number of people with asthma, aspirin and NSAIDs can cause asthma attacks. These medicines must be avoided by people who have known aspirin-sensitive asthma. It is important that people with aspirin-sensitive asthma read labels of all  over-the-counter medicines used to treat pain, colds, coughs, and fever.  Beta-blockers and ACE inhibitors are other medicines you should discuss with your health care provider. HOW CAN I FIND OUT WHAT I AM ALLERGIC TO? Ask your asthma health care provider abo Asthma, Acute Bronchospasm Acute bronchospasm caused by asthma is also referred to as an asthma attack. Bronchospasm means your air passages become narrowed. The narrowing is caused by inflammation and tightening of the muscles in the air tubes (bronchi) in your lungs. This can make it hard to breathe or cause you to wheeze and cough. CAUSES Possible triggers are:  Animal dander from the skin, hair, or feathers of animals.  Dust mites contained in house dust.  Cockroaches.  Pollen from trees or grass.  Mold.  Cigarette or tobacco smoke.  Air pollutants such as dust, household cleaners, hair sprays, aerosol sprays, paint fumes, strong chemicals, or strong odors.  Cold air  or weather changes. Cold air may trigger inflammation. Winds increase molds and pollens in the air.  Strong emotions such as crying or laughing hard.  Stress.  Certain medicines such as aspirin or beta-blockers.  Sulfites in foods and drinks, such as dried fruits and wine.  Infections or inflammatory conditions, such as a flu, cold, or inflammation of the nasal membranes (rhinitis).  Gastroesophageal reflux disease (GERD). GERD is a condition where stomach acid backs up into your esophagus.  Exercise or strenuous activity. SIGNS AND SYMPTOMS   Wheezing.  Excessive coughing, particularly at night.  Chest tightness.  Shortness of breath. DIAGNOSIS  Your health care provider will ask you about your medical history and perform a physical exam. A chest X-ray or blood testing may be performed to look for other causes of your symptoms or other conditions that may have triggered your asthma attack. TREATMENT  Treatment is aimed at reducing  inflammation and opening up the airways in your lungs. Most asthma attacks are treated with inhaled medicines. These include quick relief or rescue medicines (such as bronchodilators) and controller medicines (such as inhaled corticosteroids). These medicines are sometimes given through an inhaler or a nebulizer. Systemic steroid medicine taken by mouth or given through an IV tube also can be used to reduce the inflammation when an attack is moderate or severe. Antibiotic medicines are only used if a bacterial infection is present.  HOME CARE INSTRUCTIONS   Rest.  Drink plenty of liquids. This helps the mucus to remain thin and be easily coughed up. Only use caffeine in moderation and do not use alcohol until you have recovered from your illness.  Do not smoke. Avoid being exposed to secondhand smoke.  You play a critical role in keeping yourself in good health. Avoid exposure to things that cause you to wheeze or to have breathing problems.  Keep your medicines up-to-date and available. Carefully follow your health care provider's treatment plan.  Take your medicine exactly as prescribed.  When pollen or pollution is bad, keep windows closed and use an air conditioner or go to places with air conditioning.  Asthma requires careful medical care. See your health care provider for a follow-up as advised. If you are more than [redacted] weeks pregnant and you were prescribed any new medicines, let your obstetrician know about the visit and how you are doing. Follow up with your health care provider as directed.  After you have recovered from your asthma attack, make an appointment with your outpatient doctor to talk about ways to reduce the likelihood of future attacks. If you do not have a doctor who manages your asthma, make an appointment with a primary care doctor to discuss your asthma. SEEK IMMEDIATE MEDICAL CARE IF:   You are getting worse.  You have trouble breathing. If severe, call your local  emergency services (911 in the [place]  You develop chest pain or discomfort.  You are vomiting.  You are not able to keep fluids down.  You are coughing up yellow, green, brown, or bloody sputum.  You have a fever and your symptoms suddenly get worse.  You have trouble swallowing. MAKE SURE YOU:   Understand these instructions.  Will watch your condition.  Will get help right away if you are not doing well or get worse. Document Released: 11/11/2006 Document Revised: 08/01/2013 Document Reviewed: 02/01/2013 [facility] Patient Information 2015 [place], [place]. This information is not intended to replace advice given to you by your health care provider. Make sure  you discuss any questions you have with your health care provider.

## 2015-01-15 NOTE — ED Notes (Signed)
Pt in stating he is out of his inhaler for his asthma and out of nebulizer solution, states he only has to use these things certain times of the year and this heat has been bothering his asthma, no distress at this time, also c/o dental pain that has been going on for years but is worse recently.

## 2015-01-15 NOTE — ED Provider Notes (Signed)
CSN: [id number]     Arrival date & time 01/15/15  1546 History  This chart was scribed for non-physician practitioner, [facility] M. Damian Leavell, NP working with Elwin Mocha, MD by Doreatha Martin, ED scribe. This patient was seen in room TR06C/TR06C and the patient's care was started at 4:33 PM    Chief Complaint  Patient presents with  . Medication Refill  . Dental Pain   Patient is a 44 y.o. male presenting with tooth pain. The history is provided by the patient. No language interpreter was used.  Dental Pain Location:  Upper Upper teeth location:  1/RU 3rd molar Quality:  Aching Severity:  Moderate Onset quality:  Gradual Duration:  10 years Timing:  Constant Progression:  Worsening Chronicity:  Chronic Context: dental fracture   Relieved by:  Nothing Worsened by:  Nothing tried Ineffective treatments:  None tried Associated symptoms: no fever     HPI Comments: Sankalp Ferrell is a 44 y.o. male who presents to the Emergency Department complaining of right upper 3rd molar aching, moderate 7/10 pain onset 10 years ago and worsened last night. Pt states that his wisdom tooth has come all the way through and has since broken. He reports associated diaphoresis and exacerbation of asthma and is requesting a medication refill of albuterol. He states associated wheezing due to working out in the heat. Pt also reports a rash on his left lower leg. He denies abdominal pain and fever.   Past Medical History  Diagnosis Date  . Asthma   . Sciatic nerve pain   . COPD (chronic obstructive pulmonary disease)   . GERD (gastroesophageal reflux disease)    Past Surgical History  Procedure Laterality Date  . I&d extremity Right 06/16/2014    Procedure: IRRIGATION AND DEBRIDEMENT RIGHT THUMB AND FIRST FINGER AND PINNING ;  Surgeon: Dominica Severin, MD;  Location: [facility] OR;  Service: Orthopedics;  Laterality: Right;  . Irrigation and debridement knee Right 06/16/2014    Procedure: IRRIGATION AND DEBRIDEMENT POSTERIOR  KNEE;  Surgeon: Dominica Severin, MD;  Location: [facility] OR;  Service: Orthopedics;  Laterality: Right;  . Laceration repair Right 06/16/2014    Procedure: REPAIR ODF THREE LACERATIONS POSTERIOR KNEE;  Surgeon: Dominica Severin, MD;  Location: [facility] OR;  Service: Orthopedics;  Laterality: Right;   Family History  Problem Relation Age of Onset  . Family history unknown: Yes   History  Substance Use Topics  . Smoking status: Former Smoker    Quit date: 10/09/2014  . Smokeless tobacco: Not on file  . Alcohol Use: Yes     Comment: occ    Review of Systems  Constitutional: Positive for diaphoresis. Negative for fever.  HENT: Positive for dental problem.   Respiratory: Positive for wheezing.   Gastrointestinal: Negative for abdominal pain.  Skin: Positive for rash.  All other systems reviewed and are negative.     Allergies  Shellfish allergy; Amoxicillin; Iodine; and Peanut-containing drug products  Home Medications   Prior to Admission medications   Medication Sig Start Date End Date Taking? Authorizing Provider  albuterol (PROVENTIL) (2.5 MG/3ML) 0.083% nebulizer solution Take 3 mLs (2.5 mg total) by nebulization every 6 (six) hours as needed for wheezing or shortness of breath. 01/15/15   Nina Hoar Orlene Och, NP  clindamycin (CLEOCIN) 150 MG capsule Take 2 capsules (300 mg total) by mouth 3 (three) times daily. 01/15/15   Mieko Kneebone Orlene Och, NP  diphenhydrAMINE (BENADRYL) 25 MG tablet Take 1 tablet (25 mg total) by mouth every 6 (six)  hours. 11/26/14   [name], PA-C  Fluticasone-Salmeterol (ADVAIR DISKUS) 250-50 MCG/DOSE AEPB Inhale 1 puff into the lungs 2 (two) times daily. 10/15/14   Fayrene Helper, PA-C  hydrocortisone cream 1 % Apply to affected area 2 times daily 01/15/15   [facility], NP  ibuprofen (ADVIL,MOTRIN) 800 MG tablet Take 1 tablet (800 mg total) by mouth every 8 (eight) hours as needed. Patient not taking: Reported on 10/09/2014 09/19/14   Fayrene Helper, PA-C  methocarbamol (ROBAXIN) 500 MG tablet  Take 1 tablet (500 mg total) by mouth every 6 (six) hours as needed for muscle spasms. Patient not taking: Reported on 10/09/2014 06/18/14   Dominica Severin, MD  Multiple Vitamin (MULTIVITAMIN WITH MINERALS) TABS tablet Take 1 tablet by mouth daily.    Historical Provider, MD  naproxen (NAPROSYN) 500 MG tablet Take 1 tablet (500 mg total) by mouth 2 (two) times daily. 01/15/15   Caeli Linehan Orlene Och, NP  omeprazole (PRILOSEC) 20 MG capsule Take 1 capsule (20 mg total) by mouth daily. Patient not taking: Reported on 10/09/2014 09/19/14   Fayrene Helper, PA-C  predniSONE (DELTASONE) 10 MG tablet Take 6 on day 1 Take 5 on day 2 Take 4 on day 3 Take 3 on day 4 Take 2 on day 5 Take 1 on day 6 11/26/14   [name], PA-C  ranitidine (ZANTAC) 150 MG capsule Take 1 capsule (150 mg total) by mouth daily. Patient not taking: Reported on 10/09/2014 09/19/14   Fayrene Helper, PA-C  traMADol (ULTRAM) 50 MG tablet Take 1 tablet (50 mg total) by mouth every 6 (six) hours as needed. 01/15/15   Waverly Chavarria Orlene Och, NP   BP 129/67 mmHg  Pulse 71  Temp(Src) 98 F (36.7 C) (Oral)  Resp 18  Wt 175 lb (79.379 kg)  SpO2 95% Physical Exam  Constitutional: He is oriented to person, place, and time. He appears well-developed and well-nourished. No distress.  HENT:  Head: Normocephalic and atraumatic.  Mouth/Throat: Oropharynx is clear and moist.  Upper 3rd right molar is broken and decayed to the gum line. No abscess visualized  Uvula midline. No edema or erythema.  Eyes: Conjunctivae and EOM are normal. Pupils are equal, round, and reactive to light.  Neck: Normal range of motion. Neck supple. No tracheal deviation present.  Cardiovascular: Normal rate and regular rhythm.   Pulmonary/Chest: Effort normal. No respiratory distress. He has wheezes.  Inspiratory and expiratory wheezes. Bilateral decreased breath sounds.  Musculoskeletal: Normal range of motion. He exhibits no edema or tenderness.  Lymphadenopathy:    He has no cervical  adenopathy.  Neurological: He is alert and oriented to person, place, and time.  Skin: Skin is warm and dry. Rash noted.  Rash to the posterior aspect of the right lower left leg, dorsal aspect of right hand and to the left forearm. Consistent with allergic dermatitis.   Psychiatric: He has a normal mood and affect. His behavior is normal.  Nursing note and vitals reviewed.   ED Course  Procedures (including critical care time) DIAGNOSTIC STUDIES: Oxygen Saturation is 97% on RA, adequate by my interpretation.    COORDINATION OF CARE: 4:46 PM Discussed treatment plan with pt at bedside and pt agreed to plan.  Albuterol/atrovent neb treatment  Albuterol inhaler given here in the ED  Patient improved with neb treatment, will give Rx for his medication for his home neb.   MDM  44 y.o. male with dental pain that is chronic, asthma exacerbation and contact dermatitis.  Stable for d/c without respiratory distress and does not appear toxic. Discussed with the patient and all questioned fully answered. He is very upset that he is not getting Percocet for his dental pain. Explained to the patient that Percocet is not indicated for uncomplicated dental pain. Patient left very upset.     Final diagnoses:  Pain due to dental caries  Poison ivy dermatitis  Asthma flare   I personally performed the services described in this documentation, which was scribed in my presence. The recorded information has been reviewed and is accurate.   [address] LaneHope West PlainsM Shenice Dolder, NP 01/18/15 0025  Elwin MochaBlair Walden, MD 01/28/15 [phone]

## 2015-01-28 ENCOUNTER — Emergency Department (HOSPITAL_COMMUNITY)
Admission: EM | Admit: 2015-01-28 | Discharge: 2015-01-28 | Disposition: A | Discharge status: Home / Self Care | Payer: PRIVATE HEALTH INSURANCE | Attending: Emergency Medicine | Admitting: Emergency Medicine

## 2015-01-28 ENCOUNTER — Emergency Department (HOSPITAL_COMMUNITY): Payer: PRIVATE HEALTH INSURANCE

## 2015-01-28 ENCOUNTER — Encounter (HOSPITAL_COMMUNITY): Payer: Self-pay | Admitting: Oncology

## 2015-01-28 DIAGNOSIS — Z7951 Long term (current) use of inhaled steroids: Secondary | ICD-10-CM | POA: Insufficient documentation

## 2015-01-28 DIAGNOSIS — K219 Gastro-esophageal reflux disease without esophagitis: Secondary | ICD-10-CM | POA: Insufficient documentation

## 2015-01-28 DIAGNOSIS — J441 Chronic obstructive pulmonary disease with (acute) exacerbation: Secondary | ICD-10-CM | POA: Insufficient documentation

## 2015-01-28 DIAGNOSIS — Z79899 Other long term (current) drug therapy: Secondary | ICD-10-CM | POA: Insufficient documentation

## 2015-01-28 DIAGNOSIS — J45901 Unspecified asthma with (acute) exacerbation: Secondary | ICD-10-CM

## 2015-01-28 DIAGNOSIS — J069 Acute upper respiratory infection, unspecified: Secondary | ICD-10-CM | POA: Insufficient documentation

## 2015-01-28 LAB — BASIC METABOLIC PANEL
Anion gap: 8 (ref 5–15)
BUN: 15 mg/dL (ref 6–20)
CO2: 29 mmol/L (ref 22–32)
Calcium: 9.2 mg/dL (ref 8.9–10.3)
Chloride: 101 mmol/L (ref 101–111)
Creatinine, Ser: 1.79 mg/dL — ABNORMAL HIGH (ref 0.61–1.24)
GFR calc Af Amer: 51 mL/min — ABNORMAL LOW (ref >60)
GFR, EST NON AFRICAN AMERICAN: 44 mL/min — AB (ref >60)
GLUCOSE: 111 mg/dL — AB (ref 65–99)
POTASSIUM: 4.6 mmol/L (ref 3.5–5.1)
Sodium: 138 mmol/L (ref 135–145)

## 2015-01-28 LAB — CBC
HCT: 43.8 % (ref 39.0–52.0)
Hemoglobin: 14.9 g/dL (ref 13.0–17.0)
MCH: 33.3 pg (ref 26.0–34.0)
MCHC: 34 g/dL (ref 30.0–36.0)
MCV: 97.8 fL (ref 78.0–100.0)
Platelets: 276 10*3/uL (ref 150–400)
RBC: 4.48 MIL/uL (ref 4.22–5.81)
RDW: 13.9 % (ref 11.5–15.5)
WBC: 7 10*3/uL (ref 4.0–10.5)

## 2015-01-28 LAB — I-STAT TROPONIN, ED: Troponin i, poc: 0.02 ng/mL (ref 0.00–0.08)

## 2015-01-28 MED ORDER — PANTOPRAZOLE SODIUM 40 MG PO TBEC
40.0000 mg | DELAYED_RELEASE_TABLET | Freq: Once | ORAL | Status: AC
  Administered 2015-01-28: 40 mg via ORAL
  Filled 2015-01-28: qty 1

## 2015-01-28 MED ORDER — ALBUTEROL SULFATE (2.5 MG/3ML) 0.083% IN NEBU
5.0000 mg | INHALATION_SOLUTION | Freq: Once | RESPIRATORY_TRACT | Status: DC
Start: 2015-01-28 — End: 2015-01-28

## 2015-01-28 MED ORDER — PREDNISONE 10 MG PO TABS: 60.0000 mg | ORAL_TABLET | Freq: Every day | ORAL | Status: DC

## 2015-01-28 MED ORDER — ALBUTEROL SULFATE HFA 108 (90 BASE) MCG/ACT IN AERS
4.0000 | INHALATION_SPRAY | Freq: Once | RESPIRATORY_TRACT | Status: AC
  Administered 2015-01-28: 4 via RESPIRATORY_TRACT
  Filled 2015-01-28: qty 6.7

## 2015-01-28 MED ORDER — PREDNISONE 20 MG PO TABS
60.0000 mg | ORAL_TABLET | Freq: Once | ORAL | Status: AC
  Administered 2015-01-28: 60 mg via ORAL
  Filled 2015-01-28: qty 3

## 2015-01-28 MED ORDER — IPRATROPIUM-ALBUTEROL 0.5-2.5 (3) MG/3ML IN SOLN
3.0000 mL | Freq: Once | RESPIRATORY_TRACT | Status: AC
  Administered 2015-01-28: 3 mL via RESPIRATORY_TRACT
  Filled 2015-01-28: qty 3

## 2015-01-28 NOTE — Discharge Instructions (Signed)

## 2015-01-28 NOTE — ED Notes (Signed)
Bed: FS23 Expected date:  Expected time:  Means of arrival:  Comments: Asthma exacerbation

## 2015-01-28 NOTE — ED Notes (Signed)
Per EMS pt called d/t asthma exacerbation.  Pt ambulatory to ambulance.  Pt given 2 duo nebs en route.  Upon arrival EMS noted inspiratory and expiratory wheezing mostly resolved after duo neb.  Pt is ambulatory in ED and talking in full sentences.

## 2015-01-28 NOTE — ED Provider Notes (Signed)
CSN: [id number]     Arrival date & time 01/28/15  0056 History   First MD Initiated Contact with Patient 01/28/15 0119     Chief Complaint  Patient presents with  . Asthma     (Consider location/radiation/quality/duration/timing/severity/associated sxs/prior Treatment) Patient is a 44 y.o. male presenting with shortness of breath.  Shortness of Breath Severity:  Moderate Onset quality:  Gradual Duration:  1 week Timing:  Constant Progression:  Worsening Chronicity:  Recurrent Context comment:  Working in a [employer], government.  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 Relieved by: duoneb given by EMS. Ineffective treatments: "the yellow ventolin" Associated symptoms: chest pain (tightness), cough, sore throat and wheezing   Associated symptoms: no fever   Associated symptoms comment:  Nasal congestion   Past Medical History  Diagnosis Date  . Asthma   . Sciatic nerve pain   . COPD (chronic obstructive pulmonary disease)   . GERD (gastroesophageal reflux disease)    Past Surgical History  Procedure Laterality Date  . I&d extremity Right 06/16/2014    Procedure: IRRIGATION AND DEBRIDEMENT RIGHT THUMB AND FIRST FINGER AND PINNING ;  Surgeon: Dominica Severin, MD;  Location: [facility] OR;  Service: Orthopedics;  Laterality: Right;  . Irrigation and debridement knee Right 06/16/2014    Procedure: IRRIGATION AND DEBRIDEMENT POSTERIOR KNEE;  Surgeon: Dominica Severin, MD;  Location: [facility] OR;  Service: Orthopedics;  Laterality: Right;  . Laceration repair Right 06/16/2014    Procedure: REPAIR ODF THREE LACERATIONS POSTERIOR KNEE;  Surgeon: Dominica Severin, MD;  Location: [facility] OR;  Service: Orthopedics;  Laterality: Right;   Family History  Problem Relation Age of Onset  . Family history unknown: Yes   History  Substance Use Topics  . Smoking status: Former Smoker    Quit date: 10/09/2014  . Smokeless tobacco: Not on file  . Alcohol Use: Yes     Comment: occ    Review of Systems  Constitutional: Negative  for fever.  HENT: Positive for sore throat.   Respiratory: Positive for cough, shortness of breath and wheezing.   Cardiovascular: Positive for chest pain (tightness).  All other systems reviewed and are negative.     Allergies  Shellfish allergy; Amoxicillin; Iodine; and Peanut-containing drug products  Home Medications   Prior to Admission medications   Medication Sig Start Date End Date Taking? Authorizing Provider  albuterol (PROVENTIL) (2.5 MG/3ML) 0.083% nebulizer solution Take 3 mLs (2.5 mg total) by nebulization every 6 (six) hours as needed for wheezing or shortness of breath. 01/15/15   Hope Orlene Och, NP  clindamycin (CLEOCIN) 150 MG capsule Take 2 capsules (300 mg total) by mouth 3 (three) times daily. 01/15/15   Hope Orlene Och, NP  diphenhydrAMINE (BENADRYL) 25 MG tablet Take 1 tablet (25 mg total) by mouth every 6 (six) hours. 11/26/14   [name], PA-C  Fluticasone-Salmeterol (ADVAIR DISKUS) 250-50 MCG/DOSE AEPB Inhale 1 puff into the lungs 2 (two) times daily. 10/15/14   Fayrene Helper, PA-C  hydrocortisone cream 1 % Apply to affected area 2 times daily 01/15/15   [facility], NP  ibuprofen (ADVIL,MOTRIN) 800 MG tablet Take 1 tablet (800 mg total) by mouth every 8 (eight) hours as needed. Patient not taking: Reported on 10/09/2014 09/19/14   Fayrene Helper, PA-C  methocarbamol (ROBAXIN) 500 MG tablet Take 1 tablet (500 mg total) by mouth every 6 (six) hours as needed for muscle spasms. Patient not taking: Reported on 10/09/2014 06/18/14   Dominica Severin, MD  Multiple Vitamin (MULTIVITAMIN WITH MINERALS)  TABS tablet Take 1 tablet by mouth daily.    Historical Provider, MD  naproxen (NAPROSYN) 500 MG tablet Take 1 tablet (500 mg total) by mouth 2 (two) times daily. 01/15/15   Hope Orlene Och, NP  omeprazole (PRILOSEC) 20 MG capsule Take 1 capsule (20 mg total) by mouth daily. Patient not taking: Reported on 10/09/2014 09/19/14   Fayrene Helper, PA-C  predniSONE (DELTASONE) 10 MG tablet Take 6 on day 1 Take  5 on day 2 Take 4 on day 3 Take 3 on day 4 Take 2 on day 5 Take 1 on day 6 11/26/14   [name], PA-C  ranitidine (ZANTAC) 150 MG capsule Take 1 capsule (150 mg total) by mouth daily. Patient not taking: Reported on 10/09/2014 09/19/14   Fayrene Helper, PA-C   BP 136/87 mmHg  Pulse 74  Temp(Src) 98.6 F (37 C) (Oral)  Resp 25  Ht  (1.803 m)  Wt 175 lb (79.379 kg)  BMI 24.42 kg/m2  SpO2 98% Physical Exam  Constitutional: He is oriented to person, place, and time. He appears well-developed and well-nourished. No distress.  HENT:  Head: Normocephalic and atraumatic.  Mouth/Throat: Oropharynx is clear and moist.  Eyes: Conjunctivae are normal. Pupils are equal, round, and reactive to light. No scleral icterus.  Neck: Neck supple.  Cardiovascular: Normal rate, regular rhythm, normal heart sounds and intact distal pulses.   No murmur heard. Pulmonary/Chest: Effort normal. No stridor. No respiratory distress. He has wheezes (diffuse). He has no rales.  Abdominal: Soft. He exhibits no distension. There is no tenderness.  Musculoskeletal: Normal range of motion. He exhibits no edema.  Neurological: He is alert and oriented to person, place, and time.  Skin: Skin is warm and dry. No rash noted.  Psychiatric: He has a normal mood and affect. His behavior is normal.  Nursing note and vitals reviewed.   ED Course  Procedures (including critical care time) Labs Review Labs Reviewed  BASIC METABOLIC PANEL - Abnormal; Notable for the following:    Glucose, Bld 111 (*)    Creatinine, Ser 1.79 (*)    GFR calc non Af Amer 44 (*)    GFR calc Af Amer 51 (*)    All other components within normal limits  CBC  I-STAT TROPOININ, ED    Imaging Review Dg Chest 2 View (if Patient Has Fever And/or Copd)  01/28/2015   CLINICAL DATA:  Acute onset of cough, congestion and shortness of breath. Initial encounter.  EXAM: CHEST  2 VIEW  COMPARISON:  Chest radiograph performed 01/15/2015  FINDINGS:  The lungs are well-aerated and clear. There is no evidence of focal opacification, pleural effusion or pneumothorax.  The heart is normal in size; the mediastinal contour is within normal limits. No acute osseous abnormalities are seen.  IMPRESSION: No acute cardiopulmonary process seen.   Electronically Signed   By: Roanna Raider M.D.   On: 01/28/2015 02:02     EKG Interpretation   Date/Time:  Monday January 28 2015 01:10:16 EDT Ventricular Rate:  74 PR Interval:  164 QRS Duration: 79 QT Interval:  376 QTC Calculation: 417 R Axis:   88 Text Interpretation:  Sinus rhythm Consider left ventricular hypertrophy  Inferior infarct, acute (LCx) ST elevation, consider anterior injury  Lateral leads are also involved similar to prior Confirmed by [facility]  MD,  TREY ([id number]) on 01/28/2015 1:18:02 AM      MDM   Final diagnoses:  Acute asthma exacerbation, unspecified asthma severity  Acute URI    Well appearing 44 yo male with hx of asthma presenting with wheezing, cough, URI symptoms.    Improved after duoneb and inhaler.  Will start prednisone burst.  He feels better now and has normal work of breathing.  Appears stable for dc home.     Blake Divine, MD 01/28/15 [phone]

## 2015-02-01 ENCOUNTER — Emergency Department (HOSPITAL_COMMUNITY)
Admission: EM | Admit: 2015-02-01 | Discharge: 2015-02-01 | Disposition: A | Discharge status: Home / Self Care | Payer: PRIVATE HEALTH INSURANCE | Attending: Emergency Medicine | Admitting: Emergency Medicine

## 2015-02-01 ENCOUNTER — Encounter (HOSPITAL_COMMUNITY): Payer: Self-pay | Admitting: *Deleted

## 2015-02-01 DIAGNOSIS — K219 Gastro-esophageal reflux disease without esophagitis: Secondary | ICD-10-CM | POA: Diagnosis not present

## 2015-02-01 DIAGNOSIS — J449 Chronic obstructive pulmonary disease, unspecified: Secondary | ICD-10-CM | POA: Diagnosis not present

## 2015-02-01 DIAGNOSIS — Z79899 Other long term (current) drug therapy: Secondary | ICD-10-CM | POA: Diagnosis not present

## 2015-02-01 DIAGNOSIS — R062 Wheezing: Secondary | ICD-10-CM | POA: Diagnosis present

## 2015-02-01 DIAGNOSIS — Z87891 Personal history of nicotine dependence: Secondary | ICD-10-CM | POA: Insufficient documentation

## 2015-02-01 DIAGNOSIS — Z880 Allergy status to penicillin: Secondary | ICD-10-CM | POA: Diagnosis not present

## 2015-02-01 DIAGNOSIS — Z7952 Long term (current) use of systemic steroids: Secondary | ICD-10-CM | POA: Diagnosis not present

## 2015-02-01 DIAGNOSIS — J45901 Unspecified asthma with (acute) exacerbation: Secondary | ICD-10-CM

## 2015-02-01 DIAGNOSIS — Z7951 Long term (current) use of inhaled steroids: Secondary | ICD-10-CM | POA: Insufficient documentation

## 2015-02-01 MED ORDER — IPRATROPIUM-ALBUTEROL 0.5-2.5 (3) MG/3ML IN SOLN
3.0000 mL | Freq: Once | RESPIRATORY_TRACT | Status: AC
  Administered 2015-02-01: 3 mL via RESPIRATORY_TRACT
  Filled 2015-02-01: qty 3

## 2015-02-01 MED ORDER — ALBUTEROL (5 MG/ML) CONTINUOUS INHALATION SOLN
10.0000 mg | INHALATION_SOLUTION | RESPIRATORY_TRACT | Status: AC
  Administered 2015-02-01: 10 mg via RESPIRATORY_TRACT
  Filled 2015-02-01: qty 20

## 2015-02-01 MED ORDER — IPRATROPIUM BROMIDE 0.02 % IN SOLN
0.5000 mg | Freq: Once | RESPIRATORY_TRACT | Status: AC
  Administered 2015-02-01: 0.5 mg via RESPIRATORY_TRACT
  Filled 2015-02-01: qty 2.5

## 2015-02-01 MED ORDER — SUCRALFATE 1 GM/10ML PO SUSP
0.5000 g | Freq: Once | ORAL | Status: AC
  Administered 2015-02-01: 0.5 g via ORAL
  Filled 2015-02-01: qty 10

## 2015-02-01 MED ORDER — ALBUTEROL SULFATE HFA 108 (90 BASE) MCG/ACT IN AERS
2.0000 | INHALATION_SPRAY | Freq: Once | RESPIRATORY_TRACT | Status: AC
  Administered 2015-02-01: 2 via RESPIRATORY_TRACT
  Filled 2015-02-01: qty 6.7

## 2015-02-01 NOTE — ED Notes (Signed)
Pt states he started having asthma attack 2 days ago, called EMS out yesterday for a duoneb, was planning on in morning going to get prescriptions filled for inhaler and prednisone but states about 2 hours ago started having wheezing again, pt states he feels a little better since medications were given by EMS but can tell he needs another treatment, pt still wheezing, states has had a cough also, states it has bee non-productive.

## 2015-02-01 NOTE — ED Notes (Signed)
Bed: WA03 Expected date:  Expected time:  Means of arrival:  Comments: EMS Asthma Exac, getting Duoneb and Solumedrol

## 2015-02-01 NOTE — ED Notes (Signed)
Per EMS pt from home, been having productive cough x couple days, said his asthma exacerbation started yesterday and called EMS out to get duoneb, but stated it worsened again about 2 hours ago, ran out of inhaler also, received by EMS total of 10mg  albuterol, .5 atrovent, 125 mg solumedrol IV, 18G L AC. Still wheezing throughout.

## 2015-02-01 NOTE — ED Provider Notes (Signed)
CSN: [id number]     Arrival date & time 02/01/15  4098 History   First MD Initiated Contact with Patient 02/01/15 [phone]     Chief Complaint  Patient presents with  . Asthma     (Consider location/radiation/quality/duration/timing/severity/associated sxs/prior Treatment) HPI Comments: Patient is a 44 year old male presenting to the emergency department for 2 days of worsening asthma exacerbation. He states he ran out of his inhaler and has had worsening wheezing, chest tightness and shortness of breath since about 2 hours prior to arrival. He states with the weather changes recently his asthma has been bothering him more. He has yet to fill his inhaler and prednisone prescription. Endorses improvement after medications given by EMS but denies complete resolution. Denies any productive cough, fevers, chills, nausea, vomiting. Denies any history of admission for asthma exacerbations.  Patient is a 44 y.o. male presenting with asthma.  Asthma Associated symptoms include coughing.    Past Medical History  Diagnosis Date  . Asthma   . Sciatic nerve pain   . COPD (chronic obstructive pulmonary disease)   . GERD (gastroesophageal reflux disease)    Past Surgical History  Procedure Laterality Date  . I&d extremity Right 06/16/2014    Procedure: IRRIGATION AND DEBRIDEMENT RIGHT THUMB AND FIRST FINGER AND PINNING ;  Surgeon: Dominica Severin, MD;  Location: [facility] OR;  Service: Orthopedics;  Laterality: Right;  . Irrigation and debridement knee Right 06/16/2014    Procedure: IRRIGATION AND DEBRIDEMENT POSTERIOR KNEE;  Surgeon: Dominica Severin, MD;  Location: [facility] OR;  Service: Orthopedics;  Laterality: Right;  . Laceration repair Right 06/16/2014    Procedure: REPAIR ODF THREE LACERATIONS POSTERIOR KNEE;  Surgeon: Dominica Severin, MD;  Location: [facility] OR;  Service: Orthopedics;  Laterality: Right;   Family History  Problem Relation Age of Onset  . Family history unknown: Yes   History  Substance Use Topics   . Smoking status: Former Smoker    Quit date: 10/09/2014  . Smokeless tobacco: Not on file  . Alcohol Use: Yes     Comment: occ    Review of Systems  Respiratory: Positive for cough, chest tightness, shortness of breath and wheezing.   All other systems reviewed and are negative.     Allergies  Shellfish allergy; Amoxicillin; Iodine; and Peanut-containing drug products  Home Medications   Prior to Admission medications   Medication Sig Start Date End Date Taking? Authorizing Provider  albuterol (PROVENTIL HFA;VENTOLIN HFA) 108 (90 BASE) MCG/ACT inhaler Inhale 2 puffs into the lungs every 6 (six) hours as needed for wheezing or shortness of breath.   Yes Historical Provider, MD  albuterol (PROVENTIL) (2.5 MG/3ML) 0.083% nebulizer solution Take 3 mLs (2.5 mg total) by nebulization every 6 (six) hours as needed for wheezing or shortness of breath. 01/15/15  Yes Hope Orlene Och, NP  hydrocortisone cream 1 % Apply to affected area 2 times daily Patient taking differently: Apply 1 application topically 2 (two) times daily as needed for itching (rash). Apply to affected area 2 times daily 01/15/15  Yes Hope Orlene Och, NP  Multiple Vitamin (MULTIVITAMIN WITH MINERALS) TABS tablet Take 1 tablet by mouth daily.   Yes Historical Provider, MD  tetrahydrozoline 0.05 % ophthalmic solution Place 1 drop into both eyes 2 (two) times daily as needed (itching).   Yes Historical Provider, MD  clindamycin (CLEOCIN) 150 MG capsule Take 2 capsules (300 mg total) by mouth 3 (three) times daily. Patient not taking: Reported on 01/28/2015 01/15/15   [facility] Orlene Och,  NP  diphenhydrAMINE (BENADRYL) 25 MG tablet Take 1 tablet (25 mg total) by mouth every 6 (six) hours. Patient not taking: Reported on 01/28/2015 11/26/14   [name], PA-C  Fluticasone-Salmeterol (ADVAIR DISKUS) 250-50 MCG/DOSE AEPB Inhale 1 puff into the lungs 2 (two) times daily. Patient not taking: Reported on 01/28/2015 10/15/14   Fayrene Helper, PA-C   ibuprofen (ADVIL,MOTRIN) 800 MG tablet Take 1 tablet (800 mg total) by mouth every 8 (eight) hours as needed. Patient not taking: Reported on 10/09/2014 09/19/14   Fayrene Helper, PA-C  methocarbamol (ROBAXIN) 500 MG tablet Take 1 tablet (500 mg total) by mouth every 6 (six) hours as needed for muscle spasms. Patient not taking: Reported on 10/09/2014 06/18/14   Dominica Severin, MD  naproxen (NAPROSYN) 500 MG tablet Take 1 tablet (500 mg total) by mouth 2 (two) times daily. Patient not taking: Reported on 01/28/2015 01/15/15   Janne Napoleon, NP  omeprazole (PRILOSEC) 20 MG capsule Take 1 capsule (20 mg total) by mouth daily. Patient not taking: Reported on 10/09/2014 09/19/14   Fayrene Helper, PA-C  predniSONE (DELTASONE) 10 MG tablet Take 6 tablets (60 mg total) by mouth daily. Patient not taking: Reported on 02/01/2015 01/29/15   Blake Divine, MD  ranitidine (ZANTAC) 150 MG capsule Take 1 capsule (150 mg total) by mouth daily. Patient not taking: Reported on 02/01/2015 09/19/14   Fayrene Helper, PA-C   BP 157/81 mmHg  Pulse 80  Temp(Src) 97.9 F (36.6 C) (Oral)  Resp 18  SpO2 100% Physical Exam  Constitutional: He is oriented to person, place, and time. He appears well-developed and well-nourished. No distress.  HENT:  Head: Normocephalic and atraumatic.  Right Ear: External ear normal.  Left Ear: External ear normal.  Nose: Nose normal.  Mouth/Throat: Oropharynx is clear and moist.  Eyes: Conjunctivae are normal.  Neck: Neck supple.  Cardiovascular: Normal rate, regular rhythm and normal heart sounds.   Pulmonary/Chest: Effort normal. No accessory muscle usage. No tachypnea. No respiratory distress. He has wheezes (Inspiratory and expiratory). He exhibits no tenderness.  Abdominal: Soft. There is no tenderness.  Musculoskeletal: Normal range of motion. He exhibits no edema.  Neurological: He is alert and oriented to person, place, and time.  Skin: Skin is warm and dry. He is not diaphoretic.  Nursing note  and vitals reviewed.   ED Course  Procedures (including critical care time) Medications  albuterol (PROVENTIL,VENTOLIN) solution continuous neb (0 mg Nebulization Stopped 02/01/15 0520)  sucralfate (CARAFATE) 1 GM/10ML suspension 0.5 g (0.5 g Oral Given 02/01/15 0359)  ipratropium (ATROVENT) nebulizer solution 0.5 mg (0.5 mg Nebulization Given 02/01/15 0413)  ipratropium-albuterol (DUONEB) 0.5-2.5 (3) MG/3ML nebulizer solution 3 mL (3 mLs Nebulization Given 02/01/15 0525)  albuterol (PROVENTIL HFA;VENTOLIN HFA) 108 (90 BASE) MCG/ACT inhaler 2 puff (2 puffs Inhalation Given 02/01/15 0540)    Labs Review Labs Reviewed - No data to display  Imaging Review No results found.   EKG Interpretation None      5:49 AM on reevaluation no respiratory distress, no tachypnea or tachycardia. Scant expiratory wheeze noted on examination. Patient endorses he is breathing more than his baseline.  MDM   Final diagnoses:  Asthma exacerbation   Filed Vitals:   02/01/15 0540  BP: 157/81  Pulse: 80  Temp:   Resp: 18    Afebrile, NAD, non-toxic appearing, AAOx4.   I have reviewed nursing notes, vital signs, and all appropriate lab and imaging results if ordered as above.   Patient presenting to  the ED with asthma exacerbation. Pt alert, active, and oriented per age. PE showed inspiratory and expiratory wheezing without respiratory distress or hypoxia. 10 mg/hr CAT and one duoneb given in the ED with improvement. Oxygen saturations maintained above 92% in the ED. No evidence of respiratory distress, hypoxia, retractions, or accessory muscle use on re-evaluation. No indication for admission at this time. Will discharge patient home with albuterol inhaler, still has Prednisone prescription to pick up at pharmacy advised patient to fill and take course. Return precautions discussed. Parent agreeable to plan. Patient is stable at time of discharge     Francee Piccolo, PA-C 02/01/15  0454  Shon Baton, MD 02/01/15 [phone]

## 2015-02-01 NOTE — ED Notes (Signed)
RT called to give neb tx

## 2015-02-01 NOTE — ED Notes (Signed)
Pt ambulated around ED w/ no difficulty or distress, oxygen sat stayed between 97-100%

## 2015-02-01 NOTE — Discharge Instructions (Signed)
Please follow up with your primary care physician in 1-2 days. If you do not have one please call the [facility] number listed above. Please read all discharge instructions and return precautions.  ° ° °Asthma °Asthma is a recurring condition in which the airways tighten and narrow. Asthma can make it difficult to breathe. It can cause coughing, wheezing, and shortness of breath. Asthma episodes, also called asthma attacks, range from minor to life-threatening. Asthma cannot be cured, but medicines and lifestyle changes can help control it. °CAUSES °Asthma is believed to be caused by inherited (genetic) and environmental factors, but its exact cause is unknown. Asthma may be triggered by allergens, lung infections, or irritants in the air. Asthma triggers are different for each person. Common triggers include:  °· Animal dander. °· Dust mites. °· Cockroaches. °· Pollen from trees or grass. °· Mold. °· Smoke. °· Air pollutants such as dust, household cleaners, hair sprays, aerosol sprays, paint fumes, strong chemicals, or strong odors. °· Cold air, weather changes, and winds (which increase molds and pollens in the air). °· Strong emotional expressions such as crying or laughing hard. °· Stress. °· Certain medicines (such as aspirin) or types of drugs (such as beta-blockers). °· Sulfites in foods and drinks. Foods and drinks that may contain sulfites include dried fruit, potato chips, and sparkling grape juice. °· Infections or inflammatory conditions such as the flu, a cold, or an inflammation of the nasal membranes (rhinitis). °· Gastroesophageal reflux disease (GERD). °· Exercise or strenuous activity. °SYMPTOMS °Symptoms may occur immediately after asthma is triggered or many hours later. Symptoms include: °· Wheezing. °· Excessive nighttime or early morning coughing. °· Frequent or severe coughing with a common cold. °· Chest tightness. °· Shortness of breath. °DIAGNOSIS  °The diagnosis of  asthma is made by a review of your medical history and a physical exam. Tests may also be performed. These may include: °· Lung function studies. These tests show how much air you breathe in and out. °· Allergy tests. °· Imaging tests such as X-rays. °TREATMENT  °Asthma cannot be cured, but it can usually be controlled. Treatment involves identifying and avoiding your asthma triggers. It also involves medicines. There are 2 classes of medicine used for asthma treatment:  °· Controller medicines. These prevent asthma symptoms from occurring. They are usually taken every day. °· Reliever or rescue medicines. These quickly relieve asthma symptoms. They are used as needed and provide short-term relief. °Your health care provider will help you create an asthma action plan. An asthma action plan is a written plan for managing and treating your asthma attacks. It includes a list of your asthma triggers and how they may be avoided. It also includes information on when medicines should be taken and when their dosage should be changed. An action plan may also involve the use of a device called a peak flow meter. A peak flow meter measures how well the lungs are working. It helps you monitor your condition. °HOME CARE INSTRUCTIONS  °· Take medicines only as directed by your health care provider. Speak with your health care provider if you have questions about how or when to take the medicines. °· Use a peak flow meter as directed by your health care provider. Record and keep track of readings. °· Understand and use the action plan to help minimize or stop an asthma attack without needing to seek medical care. °· Control your home environment in the following ways to help prevent   asthma attacks: °¨ Do not smoke. Avoid being exposed to secondhand smoke. °¨ Change your heating and air conditioning filter regularly. °¨ Limit your use of fireplaces and wood stoves. °¨ Get rid of pests (such as roaches and mice) and their  droppings. °¨ Throw away plants if you see mold on them. °¨ Clean your floors and dust regularly. Use unscented cleaning products. °¨ Try to have someone else vacuum for you regularly. Stay out of rooms while they are being vacuumed and for a short while afterward. If you vacuum, use a dust mask from a hardware store, a double-layered or microfilter vacuum cleaner bag, or a vacuum cleaner with a HEPA filter. °¨ Replace carpet with wood, tile, or vinyl flooring. Carpet can trap dander and dust. °¨ Use allergy-proof pillows, mattress covers, and box spring covers. °¨ Wash bed sheets and blankets every week in hot water and dry them in a dryer. °¨ Use blankets that are made of polyester or cotton. °¨ Clean bathrooms and kitchens with bleach. If possible, have someone repaint the walls in these rooms with mold-resistant paint. Keep out of the rooms that are being cleaned and painted. °¨ Wash hands frequently. °SEEK MEDICAL CARE IF:  °· You have wheezing, shortness of breath, or a cough even if taking medicine to prevent attacks. °· The colored mucus you cough up (sputum) is thicker than usual. °· Your sputum changes from clear or white to yellow, green, gray, or bloody. °· You have any problems that may be related to the medicines you are taking (such as a rash, itching, swelling, or trouble breathing). °· You are using a reliever medicine more than 2-3 times per week. °· Your peak flow is still at 50-79% of your personal best after following your action plan for 1 hour. °· You have a fever. °SEEK IMMEDIATE MEDICAL CARE IF:  °· You seem to be getting worse and are unresponsive to treatment during an asthma attack. °· You are short of breath even at rest. °· You get short of breath when doing very little physical activity. °· You have difficulty eating, drinking, or talking due to asthma symptoms. °· You develop chest pain. °· You develop a fast heartbeat. °· You have a bluish color to your lips or fingernails. °· You  are light-headed, dizzy, or faint. °· Your peak flow is less than 50% of your personal best. °MAKE SURE YOU:  °· Understand these instructions. °· Will watch your condition. °· Will get help right away if you are not doing well or get worse. °Document Released: 07/27/2005 Document Revised: 12/11/2013 Document Reviewed: 02/23/2013 °[employer]® Patient Information ©2015 [employer]. This information is not intended to replace advice given to you by your health care provider. Make sure you discuss any questions you have with your health care provider. ° °

## 2015-03-25 ENCOUNTER — Emergency Department (HOSPITAL_COMMUNITY)
Admission: EM | Admit: 2015-03-25 | Discharge: 2015-03-25 | Disposition: A | Discharge status: Home / Self Care | Payer: Self-pay | Attending: Emergency Medicine | Admitting: Emergency Medicine

## 2015-03-25 ENCOUNTER — Encounter (HOSPITAL_COMMUNITY): Payer: Self-pay

## 2015-03-25 DIAGNOSIS — R079 Chest pain, unspecified: Secondary | ICD-10-CM | POA: Insufficient documentation

## 2015-03-25 DIAGNOSIS — Z79899 Other long term (current) drug therapy: Secondary | ICD-10-CM | POA: Insufficient documentation

## 2015-03-25 DIAGNOSIS — R21 Rash and other nonspecific skin eruption: Secondary | ICD-10-CM | POA: Insufficient documentation

## 2015-03-25 DIAGNOSIS — Z880 Allergy status to penicillin: Secondary | ICD-10-CM | POA: Insufficient documentation

## 2015-03-25 DIAGNOSIS — Z87891 Personal history of nicotine dependence: Secondary | ICD-10-CM | POA: Insufficient documentation

## 2015-03-25 DIAGNOSIS — J45901 Unspecified asthma with (acute) exacerbation: Secondary | ICD-10-CM

## 2015-03-25 DIAGNOSIS — IMO0001 Reserved for inherently not codable concepts without codable children: Secondary | ICD-10-CM

## 2015-03-25 DIAGNOSIS — J441 Chronic obstructive pulmonary disease with (acute) exacerbation: Secondary | ICD-10-CM | POA: Insufficient documentation

## 2015-03-25 DIAGNOSIS — K219 Gastro-esophageal reflux disease without esophagitis: Secondary | ICD-10-CM | POA: Insufficient documentation

## 2015-03-25 MED ORDER — ALBUTEROL (5 MG/ML) CONTINUOUS INHALATION SOLN
INHALATION_SOLUTION | RESPIRATORY_TRACT | Status: AC
  Filled 2015-03-25: qty 20

## 2015-03-25 MED ORDER — IPRATROPIUM BROMIDE 0.02 % IN SOLN
RESPIRATORY_TRACT | Status: AC
  Filled 2015-03-25: qty 2.5

## 2015-03-25 MED ORDER — PREDNISONE 20 MG PO TABS
60.0000 mg | ORAL_TABLET | Freq: Once | ORAL | Status: AC
  Administered 2015-03-25: 60 mg via ORAL
  Filled 2015-03-25: qty 3

## 2015-03-25 MED ORDER — ALBUTEROL SULFATE HFA 108 (90 BASE) MCG/ACT IN AERS: 4.0000 | INHALATION_SPRAY | Freq: Once | RESPIRATORY_TRACT | Status: DC

## 2015-03-25 MED ORDER — HYDROCORTISONE 1 % EX CREA: 1.0000 "application " | TOPICAL_CREAM | Freq: Two times a day (BID) | CUTANEOUS | Status: DC | PRN

## 2015-03-25 MED ORDER — IPRATROPIUM BROMIDE 0.02 % IN SOLN: 0.2500 mg | Freq: Four times a day (QID) | RESPIRATORY_TRACT | Status: DC | PRN

## 2015-03-25 MED ORDER — ALBUTEROL SULFATE HFA 108 (90 BASE) MCG/ACT IN AERS
4.0000 | INHALATION_SPRAY | Freq: Once | RESPIRATORY_TRACT | Status: AC
  Administered 2015-03-25: 4 via RESPIRATORY_TRACT
  Filled 2015-03-25: qty 6.7

## 2015-03-25 MED ORDER — ALBUTEROL SULFATE HFA 108 (90 BASE) MCG/ACT IN AERS: 2.0000 | INHALATION_SPRAY | Freq: Four times a day (QID) | RESPIRATORY_TRACT | Status: DC | PRN

## 2015-03-25 MED ORDER — FAMOTIDINE 20 MG PO TABS
20.0000 mg | ORAL_TABLET | Freq: Once | ORAL | Status: AC
  Administered 2015-03-25: 20 mg via ORAL
  Filled 2015-03-25: qty 1

## 2015-03-25 MED ORDER — ALBUTEROL SULFATE (2.5 MG/3ML) 0.083% IN NEBU: 5.0000 mg | INHALATION_SOLUTION | Freq: Once | RESPIRATORY_TRACT | Status: DC

## 2015-03-25 MED ORDER — ALBUTEROL (5 MG/ML) CONTINUOUS INHALATION SOLN
10.0000 mg/h | INHALATION_SOLUTION | Freq: Once | RESPIRATORY_TRACT | Status: AC
  Administered 2015-03-25: 10 mg/h via RESPIRATORY_TRACT

## 2015-03-25 MED ORDER — PREDNISONE 20 MG PO TABS: ORAL_TABLET | ORAL | Status: DC

## 2015-03-25 MED ORDER — IPRATROPIUM BROMIDE 0.02 % IN SOLN
0.5000 mg | Freq: Once | RESPIRATORY_TRACT | Status: AC
  Administered 2015-03-25: 0.5 mg via RESPIRATORY_TRACT

## 2015-03-25 MED ORDER — PREDNISONE 20 MG PO TABS: 40.0000 mg | ORAL_TABLET | Freq: Once | ORAL | Status: DC

## 2015-03-25 MED ORDER — ALBUTEROL SULFATE (2.5 MG/3ML) 0.083% IN NEBU
5.0000 mg | INHALATION_SOLUTION | Freq: Once | RESPIRATORY_TRACT | Status: AC
  Administered 2015-03-25: 5 mg via RESPIRATORY_TRACT
  Filled 2015-03-25: qty 6

## 2015-03-25 NOTE — ED Provider Notes (Signed)
CSN: [id number]     Arrival date & time 03/25/15  1328 History   First MD Initiated Contact with Patient 03/25/15 1620     Chief Complaint  Patient presents with  . Asthma     (Consider location/radiation/quality/duration/timing/severity/associated sxs/prior Treatment) HPI  Patient here for multiple complaints however the real reason for coming is an asthma exacerbation. He's had 2-3 days of worsening shortness of breath with cough that often happens and high heat and humidity. He is out of his inhaler and is requesting a refill that. Is also requesting refill of all his nebulizer medications, Zantac. He does not currently have heartburn, chest pain, other symptoms aside from a shortness of breath and cough. Has had these symptoms before. Has been improved with nebulized treatments at home before. His heartburn is not present currently however it relieved immediately with Zantac.  Past Medical History  Diagnosis Date  . Asthma   . Sciatic nerve pain   . COPD (chronic obstructive pulmonary disease)   . GERD (gastroesophageal reflux disease)    Past Surgical History  Procedure Laterality Date  . I&d extremity Right 06/16/2014    Procedure: IRRIGATION AND DEBRIDEMENT RIGHT THUMB AND FIRST FINGER AND PINNING ;  Surgeon: Dominica Severin, MD;  Location: [facility] OR;  Service: Orthopedics;  Laterality: Right;  . Irrigation and debridement knee Right 06/16/2014    Procedure: IRRIGATION AND DEBRIDEMENT POSTERIOR KNEE;  Surgeon: Dominica Severin, MD;  Location: [facility] OR;  Service: Orthopedics;  Laterality: Right;  . Laceration repair Right 06/16/2014    Procedure: REPAIR ODF THREE LACERATIONS POSTERIOR KNEE;  Surgeon: Dominica Severin, MD;  Location: [facility] OR;  Service: Orthopedics;  Laterality: Right;   Family History  Problem Relation Age of Onset  . Family history unknown: Yes   Social History  Substance Use Topics  . Smoking status: Former Smoker    Quit date: 10/09/2014  . Smokeless tobacco: None  .  Alcohol Use: Yes     Comment: occ    Review of Systems  Constitutional: Negative for fever, activity change and fatigue.  HENT: Negative for congestion.   Eyes: Negative for pain.  Respiratory: Positive for cough and shortness of breath.   Cardiovascular: Positive for chest pain.  Musculoskeletal: Negative for back pain and gait problem.  Skin: Positive for rash.  All other systems reviewed and are negative.     Allergies  Shellfish allergy; Amoxicillin; Iodine; and Peanut-containing drug products  Home Medications   Prior to Admission medications   Medication Sig Start Date End Date Taking? Authorizing Provider  albuterol (PROVENTIL HFA;VENTOLIN HFA) 108 (90 BASE) MCG/ACT inhaler Inhale 4 puffs into the lungs once. 03/25/15   Marily Memos, MD  albuterol (PROVENTIL) (2.5 MG/3ML) 0.083% nebulizer solution Take 6 mLs (5 mg total) by nebulization once. 03/25/15   Marily Memos, MD  hydrocortisone cream 1 % Apply 1 application topically 2 (two) times daily as needed for itching (rash). Apply to affected area 2 times daily 03/25/15   Marily Memos, MD  ipratropium (ATROVENT) 0.02 % nebulizer solution Take 1.25 mLs (0.25 mg total) by nebulization every 6 (six) hours as needed for wheezing or shortness of breath. 03/25/15   Marily Memos, MD  Multiple Vitamin (MULTIVITAMIN WITH MINERALS) TABS tablet Take 1 tablet by mouth daily.    Historical Provider, MD  predniSONE (DELTASONE) 20 MG tablet 3 tabs po daily x 3 days, then 2 tabs x 3 days, then 1.5 tabs x 3 days, then 1 tab x 3 days, then  0.5 tabs x 3 days 03/25/15   Marily Memos, MD  tetrahydrozoline 0.05 % ophthalmic solution Place 1 drop into both eyes 2 (two) times daily as needed (itching).    Historical Provider, MD   BP 118/71 mmHg  Pulse 74  Temp(Src) 98.2 F (36.8 C) (Oral)  Resp 18  SpO2 98% Physical Exam  Constitutional: He is oriented to person, place, and time. He appears well-developed and well-nourished.  Cardiovascular: Normal  rate and regular rhythm.   Pulmonary/Chest: Effort normal. No respiratory distress. He has wheezes. He has no rales. He exhibits no tenderness.  Abdominal: Soft. Bowel sounds are normal. He exhibits no distension. There is no tenderness.  Musculoskeletal: He exhibits no edema or tenderness.  Neurological: He is alert and oriented to person, place, and time.  Skin: Rash: Fascicular rash to right dorsal wrist without any evidence of bacterial infection.  Nursing note and vitals reviewed.   ED Course  Procedures (including critical care time) Labs Review Labs Reviewed - No data to display  Imaging Review No results found. I, Lanaiya Lantry, Barbara Cower, personally reviewed and evaluated these images and lab results as part of my medical decision-making.   EKG Interpretation None      MDM   Final diagnoses:  Asthma exacerbation  Rash  Reflux   She here for asthma exacerbation and alert he had a treatment prior to my evaluation and was moving good air still with some wheezing. No respiratory distress. We'll give the patient albuterol inhaler and discharged with refills of his medications. Also has what looks like a contact dermatitis that flared up after he didn't finish a taper dose of steroids. Has a history of heartburn and wants a prescription for Zantac as well as not currently having no symptoms. We'll DC with prescription for his medications and information for primary care physicians.  I have personally and contemperaneously reviewed labs and imaging and used in my decision making as above.   A medical screening exam was performed and I feel the patient has had an appropriate workup for their chief complaint at this time and likelihood of emergent condition existing is low. They have been counseled on decision, discharge, follow up and which symptoms necessitate immediate return to the emergency department. They or their family verbally stated understanding and agreement with plan and  discharged in stable condition.      Marily Memos, MD 03/25/15 [phone]

## 2015-03-25 NOTE — ED Notes (Signed)
Per pt, having asthma attack.  Pt states started last.  Pt states he has acid reflux and has had problems with his asthma due to that.

## 2015-03-25 NOTE — ED Notes (Signed)
Pt is able to talk in full complete sentences with no distress noted.  Vitals stable.

## 2015-03-25 NOTE — ED Notes (Signed)
Pt escorted to discharge window. Verbalized understanding discharge instructions. In no acute distress.   

## 2015-04-03 ENCOUNTER — Ambulatory Visit: Payer: Self-pay | Admitting: Internal Medicine

## 2015-04-04 ENCOUNTER — Ambulatory Visit: Payer: Self-pay | Attending: Internal Medicine | Admitting: Internal Medicine

## 2015-04-24 ENCOUNTER — Emergency Department (HOSPITAL_COMMUNITY)
Admission: EM | Admit: 2015-04-24 | Discharge: 2015-04-25 | Disposition: A | Discharge status: Home / Self Care | Payer: Self-pay | Attending: Emergency Medicine | Admitting: Emergency Medicine

## 2015-04-24 DIAGNOSIS — J441 Chronic obstructive pulmonary disease with (acute) exacerbation: Secondary | ICD-10-CM | POA: Insufficient documentation

## 2015-04-24 DIAGNOSIS — Z8719 Personal history of other diseases of the digestive system: Secondary | ICD-10-CM | POA: Insufficient documentation

## 2015-04-24 DIAGNOSIS — J45901 Unspecified asthma with (acute) exacerbation: Secondary | ICD-10-CM

## 2015-04-24 DIAGNOSIS — Z880 Allergy status to penicillin: Secondary | ICD-10-CM | POA: Insufficient documentation

## 2015-04-24 DIAGNOSIS — Z87891 Personal history of nicotine dependence: Secondary | ICD-10-CM | POA: Insufficient documentation

## 2015-04-24 DIAGNOSIS — Z79899 Other long term (current) drug therapy: Secondary | ICD-10-CM | POA: Insufficient documentation

## 2015-04-24 NOTE — ED Notes (Signed)
Bed: WN02 Expected date:  Expected time:  Means of arrival:  Comments: EMS 44 yo male from home/difficulty breathing/wheezing all fields, albuterol 10 mg, atrovent 0.5 mg and solumedrol

## 2015-04-24 NOTE — ED Notes (Signed)
Per [facility] - pt from home c/o difficulty breathing x2 days, pt w/ hx of asthma and emphysema - pt w/ inspiratory and expiratory wheezing on EMS arrival, pt given  albuterol, 0.5mg  atrovent and  solumedrol en route. Pt admits to improvement in symptoms. Pt w/ productive cough and states he has felt hot however unsure of fever. Pt speaking complete sentences, no acute distress.

## 2015-04-25 ENCOUNTER — Emergency Department (HOSPITAL_COMMUNITY): Payer: Self-pay

## 2015-04-25 ENCOUNTER — Encounter (HOSPITAL_COMMUNITY): Payer: Self-pay | Admitting: *Deleted

## 2015-04-25 MED ORDER — ALBUTEROL SULFATE HFA 108 (90 BASE) MCG/ACT IN AERS
1.0000 | INHALATION_SPRAY | RESPIRATORY_TRACT | Status: DC | PRN
  Administered 2015-04-25: 2 via RESPIRATORY_TRACT
  Filled 2015-04-25: qty 6.7

## 2015-04-25 MED ORDER — PREDNISONE 20 MG PO TABS: ORAL_TABLET | ORAL | Status: DC

## 2015-04-25 MED ORDER — ALBUTEROL (5 MG/ML) CONTINUOUS INHALATION SOLN
10.0000 mg/h | INHALATION_SOLUTION | Freq: Once | RESPIRATORY_TRACT | Status: AC
  Administered 2015-04-25: 10 mg/h via RESPIRATORY_TRACT
  Filled 2015-04-25: qty 20

## 2015-04-25 MED ORDER — ALBUTEROL SULFATE (2.5 MG/3ML) 0.083% IN NEBU: 5.0000 mg | INHALATION_SOLUTION | Freq: Once | RESPIRATORY_TRACT | Status: DC

## 2015-04-25 MED ORDER — ACETAMINOPHEN 325 MG PO TABS: 650.0000 mg | ORAL_TABLET | ORAL | Status: DC | PRN

## 2015-04-25 MED ORDER — AZITHROMYCIN 250 MG PO TABS: 250.0000 mg | ORAL_TABLET | Freq: Every day | ORAL | Status: DC

## 2015-04-25 NOTE — ED Provider Notes (Signed)
CSN: [id number]     Arrival date & time 04/24/15  2351 History  This chart was scribed for Keith Racer, MD by Evon Slack, ED Scribe. This patient was seen in room WA15/WA15 and the patient's care was started at 12:19 AM.     Chief Complaint  Patient presents with  . Shortness of Breath   The history is provided by the patient. No language interpreter was used.   HPI Comments: Keith Weber is a 44 y.o. male brought in by ambulance, who presents to the Emergency Department complaining of SOB onset 2 days prior. Pt reports productive cough, congestion, wheezing, subjective fever and chills. Pt state that he was using his inhaler and nebulizer treatments with relief until running out of the medication. Per Ems pt received duo neb and solumedrol en route with some relief. Pt states that his symptoms [place] Pt denies leg swelling or other related symptoms.   Past Medical History  Diagnosis Date  . Asthma   . Sciatic nerve pain   . COPD (chronic obstructive pulmonary disease)   . GERD (gastroesophageal reflux disease)    Past Surgical History  Procedure Laterality Date  . I&d extremity Right 06/16/2014    Procedure: IRRIGATION AND DEBRIDEMENT RIGHT THUMB AND FIRST FINGER AND PINNING ;  Surgeon: Dominica Severin, MD;  Location: [facility] OR;  Service: Orthopedics;  Laterality: Right;  . Irrigation and debridement knee Right 06/16/2014    Procedure: IRRIGATION AND DEBRIDEMENT POSTERIOR KNEE;  Surgeon: Dominica Severin, MD;  Location: [facility] OR;  Service: Orthopedics;  Laterality: Right;  . Laceration repair Right 06/16/2014    Procedure: REPAIR ODF THREE LACERATIONS POSTERIOR KNEE;  Surgeon: Dominica Severin, MD;  Location: [facility] OR;  Service: Orthopedics;  Laterality: Right;   Family History  Problem Relation Age of Onset  . Family history unknown: Yes   Social History  Substance Use Topics  . Smoking status: Former Smoker    Quit date: 10/09/2014  .  Smokeless tobacco: None  . Alcohol Use: Yes     Comment: occ    Review of Systems  Constitutional: Positive for fever and chills.  Respiratory: Positive for cough, shortness of breath and wheezing.   Cardiovascular: Negative for leg swelling.  Gastrointestinal: Negative for nausea, vomiting and abdominal pain.  Musculoskeletal: Negative for myalgias, back pain, neck pain and neck stiffness.  Skin: Negative for rash and wound.  Neurological: Negative for dizziness, weakness, light-headedness, numbness and headaches.  All other systems reviewed and are negative.     Allergies  Shellfish allergy; Amoxicillin; Iodine; and Peanut-containing drug products  Home Medications   Prior to Admission medications   Medication Sig Start Date End Date Taking? Authorizing Provider  albuterol (PROAIR HFA) 108 (90 BASE) MCG/ACT inhaler Inhale 2 puffs into the lungs every 6 (six) hours as needed for wheezing or shortness of breath. 03/25/15  Yes Marily Memos, MD  albuterol (PROVENTIL) (2.5 MG/3ML) 0.083% nebulizer solution Take 6 mLs (5 mg total) by nebulization once. 03/25/15  Yes Marily Memos, MD  hydrocortisone cream 1 % Apply 1 application topically 2 (two) times daily as needed for itching (rash). Apply to affected area 2 times daily 03/25/15  Yes Marily Memos, MD  ipratropium (ATROVENT) 0.02 % nebulizer solution Take 1.25 mLs (0.25 mg total) by nebulization every 6 (six) hours as needed for wheezing or shortness of breath. 03/25/15  Yes Marily Memos, MD  Multiple Vitamin (MULTIVITAMIN WITH MINERALS) TABS tablet Take 1 tablet  by mouth daily.   Yes Historical Provider, MD  Pseudoeph-Doxylamine-DM-APAP (NYQUIL PO) Take 30 mLs by mouth at bedtime as needed (cold symptoms).   Yes Historical Provider, MD  tetrahydrozoline 0.05 % ophthalmic solution Place 1 drop into both eyes 2 (two) times daily as needed (itching).   Yes Historical Provider, MD  albuterol (PROVENTIL HFA;VENTOLIN HFA) 108 (90 BASE) MCG/ACT  inhaler Inhale 4 puffs into the lungs once. Patient not taking: Reported on 04/25/2015 03/25/15   Marily Memos, MD  azithromycin (ZITHROMAX) 250 MG tablet Take 1 tablet (250 mg total) by mouth daily. Take first 2 tablets together, then 1 every day until finished. 04/25/15   Keith Racer, MD  predniSONE (DELTASONE) 20 MG tablet 3 tabs po day one, then 2 tabs daily x 4 days 04/25/15   Keith Racer, MD   BP 147/88 mmHg  Pulse 84  Temp(Src) 99.5 F (37.5 C) (Oral)  Resp 18  SpO2 94%   Physical Exam  Constitutional: He is oriented to person, place, and time. He appears well-developed and well-nourished. No distress.  Patient is in no distress. Speaking in full sentences.  HENT:  Head: Normocephalic and atraumatic.  Mouth/Throat: Oropharynx is clear and moist.  Eyes: EOM are normal. Pupils are equal, round, and reactive to light.  Neck: Normal range of motion. Neck supple.  Cardiovascular: Normal rate and regular rhythm.   Pulmonary/Chest: Effort normal. No respiratory distress. He has wheezes (diffuse expiratory wheezing). He has no rales.  Abdominal: Soft. Bowel sounds are normal.  Musculoskeletal: Normal range of motion. He exhibits no edema or tenderness.  No calf swelling or tenderness.  Neurological: He is alert and oriented to person, place, and time.  Skin: Skin is warm and dry. No rash noted. No erythema.  Psychiatric: He has a normal mood and affect. His behavior is normal.  Nursing note and vitals reviewed.   ED Course  Procedures (including critical care time) DIAGNOSTIC STUDIES: Oxygen Saturation is 94% on RA, adequate by my interpretation.    COORDINATION OF CARE: 12:27 AM-Discussed treatment plan with pt at bedside and pt agreed to plan.     Labs Review Labs Reviewed - No data to display  Imaging Review Dg Chest Port 1 View  04/25/2015   CLINICAL DATA:  44 year old male with emphysema and shortness of breath  EXAM: PORTABLE CHEST - 1 VIEW  COMPARISON:   Radiograph dated 01/28/2015  FINDINGS: Single-view of the chest demonstrates emphysematous changes of the lungs. No focal consolidation, pleural effusion, or pneumothorax. Stable cardiac silhouette. The osseous structures are grossly unremarkable.  IMPRESSION: Emphysema.  No focal consolidation or pneumothorax.   Electronically Signed   By: Elgie Collard M.D.   On: 04/25/2015 01:38      EKG Interpretation   Date/Time:  Thursday April 25 2015 00:25:24 EDT Ventricular Rate:  84 PR Interval:  145 QRS Duration: 71 QT Interval:  346 QTC Calculation: 409 R Axis:   90 Text Interpretation:  Sinus rhythm Ventricular premature complex  Borderline right axis deviation Probable left ventricular hypertrophy ST  elevation suggests acute pericarditis Confirmed by Ranae Palms  MD, Vielka Klinedinst  ([id number]) on 04/25/2015 1:06:21 AM      MDM   Final diagnoses:  Asthma exacerbation      I personally performed the services described in this documentation, which was scribed in my presence. The recorded information has been reviewed and is accurate.  Patient's breathing has significantly improved. Faint expiratory scattered wheezing. No respiratory distress. Saturations in the mid 90s.  Chest x-ray without any acute infiltrates. Advised to follow-up with his primary physician. Return precautions given.     Keith Racer, MD 04/25/15 [phone]

## 2015-04-25 NOTE — ED Notes (Signed)
D/c instructions reviewed w/ pt and family - pt and family deny any further questions or concerns at present. Pt ambulating independently w/ steady gait on d/c in no acute distress, A&Ox4. Rx given x2

## 2015-04-25 NOTE — ED Notes (Signed)
RT notified of need for neb tx.

## 2015-04-25 NOTE — Discharge Instructions (Signed)

## 2015-05-01 ENCOUNTER — Encounter: Payer: Self-pay | Admitting: Internal Medicine

## 2015-05-01 ENCOUNTER — Ambulatory Visit: Payer: Self-pay | Attending: Internal Medicine | Admitting: Internal Medicine

## 2015-05-01 DIAGNOSIS — J45909 Unspecified asthma, uncomplicated: Secondary | ICD-10-CM | POA: Insufficient documentation

## 2015-05-01 DIAGNOSIS — R0602 Shortness of breath: Secondary | ICD-10-CM | POA: Insufficient documentation

## 2015-05-01 DIAGNOSIS — R0789 Other chest pain: Secondary | ICD-10-CM | POA: Insufficient documentation

## 2015-05-01 DIAGNOSIS — F17200 Nicotine dependence, unspecified, uncomplicated: Secondary | ICD-10-CM

## 2015-05-01 DIAGNOSIS — Z720 Tobacco use: Secondary | ICD-10-CM

## 2015-05-01 DIAGNOSIS — J441 Chronic obstructive pulmonary disease with (acute) exacerbation: Secondary | ICD-10-CM

## 2015-05-01 MED ORDER — IPRATROPIUM-ALBUTEROL 0.5-2.5 (3) MG/3ML IN SOLN
3.0000 mL | Freq: Once | RESPIRATORY_TRACT | Status: AC
  Administered 2015-05-01: 3 mL via RESPIRATORY_TRACT

## 2015-05-01 MED ORDER — FLUTICASONE-SALMETEROL 250-50 MCG/DOSE IN AEPB: 1.0000 | INHALATION_SPRAY | Freq: Two times a day (BID) | RESPIRATORY_TRACT | Status: DC

## 2015-05-01 MED ORDER — IPRATROPIUM-ALBUTEROL 0.5-2.5 (3) MG/3ML IN SOLN: 3.0000 mL | Freq: Four times a day (QID) | RESPIRATORY_TRACT | Status: DC | PRN

## 2015-05-01 NOTE — Progress Notes (Signed)
Patient complains of having an asthma flair up over the past couple of days Patient is having some SOB tightness in his chest and a ticking In his throat Upon entering the room patient stated he did not feel well and verbalized He needed a breathing treatment

## 2015-05-01 NOTE — Patient Instructions (Signed)
You will begin antibiotic azithromax for night sweats and infection  Advair daily to help better manage your symptoms  Prednisone for inflammation of airways which will help breathing  I have sent you a prescription for nebulizer solution  Please get hospital discount so that I can send you to Pulmonology asap. If at all possible come up here for SOB/Wheezing instead of going to ER.  I will follow up with you in 3 weeks

## 2015-05-01 NOTE — Progress Notes (Signed)
Patient ID: Keith Weber, male   DOB: 1971/02/21, 44 y.o.   MRN: [id number] HPI  Keith Weber is a 44 yr old male who presents to the office today in acute respiratory distress requiring 2 duoneb nebulizer treatments. Patient presents with cough, dyspnea, wheezing, rhonchi, and night sweats. Patient states history of COPD and Asthma with multiple emergency room visits in last few months.  Patient currently taking albuterol. Patient had azithromycin and prednisone prescriptions from emergency room but was not taking due to cost. Patient states he has taken advair with good results in the past but stopped due to cost of medication. Patient is taking chantix to help with smoking cessation. Patient denies fever, chest or abdominal pain, nausea, vomiting and diarrhea. Patient encouraged to complete financial assistance paperwork for referral to pulmonology. Patient currently still smokes 1-2 cigarettes per day and has a history of 30 pack years.  Social History   Social History  . Marital Status: Single    Spouse Name: N/A  . Number of Children: N/A  . Years of Education: N/A   Occupational History  . Not on file.   Social History Main Topics  . Smoking status: Former Smoker    Quit date: 10/09/2014  . Smokeless tobacco: Not on file  . Alcohol Use: Yes     Comment: occ  . Drug Use: No  . Sexual Activity: Not on file   Other Topics Concern  . Not on file   Social History Narrative   Review of Systems  Constitutional: Negative.   HENT: Positive for congestion.        Rhinitis  Eyes: Negative.   Respiratory: Positive for cough, sputum production, shortness of breath and wheezing.        Sputum clear  Cardiovascular: Negative.   Skin: Negative.   Endo/Heme/Allergies: Positive for environmental allergies.  All other systems reviewed and are negative.     BP 138/84 mmHg  Pulse 86  Temp(Src) 98.8 F (37.1 C)  Resp 16  Ht  (1.803 m)  Wt 145 lb 12.8 oz (66.134 kg)  BMI  20.34 kg/m2  SpO2 95%   Physical Exam  Constitutional: He is oriented to person, place, and time. He appears well-developed and well-nourished.  HENT:  Head: Normocephalic.  Eyes: Pupils are equal, round, and reactive to light.  Neck: Normal range of motion.  Cardiovascular: Normal rate, regular rhythm and normal heart sounds.   Pulmonary/Chest: No respiratory distress. He has no wheezes.  Patient with wheezing and rhonchi   Abdominal: Soft.  Neurological: He is alert and oriented to person, place, and time.  Skin: Skin is warm and dry.  Psychiatric: He has a normal mood and affect. His behavior is normal. Judgment and thought content normal.     Administrations This Visit    ipratropium-albuterol (DUONEB) 0.5-2.5 (3) MG/3ML nebulizer solution 3 mL    Admin Date Action Dose Route Administered By         05/01/2015 Given 3 mL Nebulization Lestine Mount, LPN           Admin Date Action Dose Route Administered By         05/01/2015 Given 3 mL Nebulization Lestine Mount, LPN                 Assessment and Plan  Ronnell was seen today for asthma.  Diagnoses and all orders for this visit:  COPD exacerbation -     ipratropium-albuterol (DUONEB) 0.5-2.5 (3) MG/3ML nebulizer  solution 3 mL; Take 3 mLs by nebulization once. -     ipratropium-albuterol (DUONEB) 0.5-2.5 (3) MG/3ML SOLN; Take 3 mLs by nebulization every 6 (six) hours as needed. -     Begin Fluticasone-Salmeterol (ADVAIR DISKUS) 250-50 MCG/DOSE AEPB; Inhale 1 puff into the lungs 2 (two) times daily. -    Begin ipratropium-albuterol (DUONEB) 0.5-2.5 (3) MG/3ML nebulizer solution 3 mL; Take 3 mLs by nebulization once. Patient instructed to return to the office or to emergency room if symptoms persist or worsen. Patient encouraged to return to [facility] for care when office is open. Patient medication administration and teaching completed patient verbalized understanding. Will send Pulmonology referral once hospital  discount or medicaid is obtained.  Tobacco use disorder Patient is currently taking chantix and is actively trying to quit smoking.  Return in about 3 weeks (around 05/22/2015) for COPD.     Stephanie Coup, RN, AGNP Student 05/01/2015 6:20 PM

## 2015-05-20 ENCOUNTER — Ambulatory Visit: Payer: Self-pay | Attending: Internal Medicine | Admitting: Internal Medicine

## 2015-05-20 ENCOUNTER — Encounter: Payer: Self-pay | Admitting: Internal Medicine

## 2015-05-20 ENCOUNTER — Ambulatory Visit: Payer: Self-pay

## 2015-05-20 VITALS — BP 139/83 | HR 80 | Temp 98.0°F | Resp 16 | Ht 71.0 in | Wt 145.8 lb

## 2015-05-20 DIAGNOSIS — J45909 Unspecified asthma, uncomplicated: Secondary | ICD-10-CM | POA: Insufficient documentation

## 2015-05-20 DIAGNOSIS — J439 Emphysema, unspecified: Secondary | ICD-10-CM | POA: Insufficient documentation

## 2015-05-20 DIAGNOSIS — Z79899 Other long term (current) drug therapy: Secondary | ICD-10-CM | POA: Insufficient documentation

## 2015-05-20 DIAGNOSIS — K219 Gastro-esophageal reflux disease without esophagitis: Secondary | ICD-10-CM | POA: Insufficient documentation

## 2015-05-20 DIAGNOSIS — Z87891 Personal history of nicotine dependence: Secondary | ICD-10-CM | POA: Insufficient documentation

## 2015-05-20 MED ORDER — ALBUTEROL SULFATE HFA 108 (90 BASE) MCG/ACT IN AERS: 2.0000 | INHALATION_SPRAY | Freq: Four times a day (QID) | RESPIRATORY_TRACT | Status: DC | PRN

## 2015-05-20 NOTE — Progress Notes (Signed)
Patient ID: Keith Weber, male   DOB: 1971-06-29, 44 y.o.   MRN: [id number]  CC: COPD f/u  HPI: Keith Weber is a 44 y.o. male here today for a 3 week follow up visit for COPD.  Patient has past medical history of asthma, sciatica, COPD, GERD. Patient was seen here 3 weeks ago requiring 2 nebulizer treatments. Patient reports that he is now taking Spiriva once daily and albuterol several times per day. He states that he feels better but notices that he has more symptoms at night such as coughing and SOB. Patient is in the process of applying for [facility] discount so that he may get referral to Pulmonology. Patient reports that he needs a letter stating that he has had several ER visits due to his COPD. He reports that he is in the process of seeking psychiatry with [facility] because he has been diagnosed with Bipolar depression and Schizophrenia in the past. He has been off medication for several months. He denies suicidal ideations.    Allergies  Allergen Reactions  . Shellfish Allergy Anaphylaxis and Shortness Of Breath  . Amoxicillin Hives   Past Medical History  Diagnosis Date  . Asthma   . Sciatic nerve pain   . COPD (chronic obstructive pulmonary disease) (HCC)   . GERD (gastroesophageal reflux disease)    Current Outpatient Prescriptions on File Prior to Visit  Medication Sig Dispense Refill  . albuterol (PROVENTIL) (2.5 MG/3ML) 0.083% nebulizer solution Take 6 mLs (5 mg total) by nebulization once. 75 mL 12  . Fluticasone-Salmeterol (ADVAIR DISKUS) 250-50 MCG/DOSE AEPB Inhale 1 puff into the lungs 2 (two) times daily. 60 each 2  . ipratropium-albuterol (DUONEB) 0.5-2.5 (3) MG/3ML SOLN Take 3 mLs by nebulization every 6 (six) hours as needed. 360 mL 1  . Multiple Vitamin (MULTIVITAMIN WITH MINERALS) TABS tablet Take 1 tablet by mouth daily.    Marland Kitchen azithromycin (ZITHROMAX) 250 MG tablet Take 1 tablet (250 mg total) by mouth daily. Take first 2 tablets together, then 1 every day until  finished. 6 tablet 0  . hydrocortisone cream 1 % Apply 1 application topically 2 (two) times daily as needed for itching (rash). Apply to affected area 2 times daily 15 g 0  . predniSONE (DELTASONE) 20 MG tablet 3 tabs po day one, then 2 tabs daily x 4 days 11 tablet 0  . Pseudoeph-Doxylamine-DM-APAP (NYQUIL PO) Take 30 mLs by mouth at bedtime as needed (cold symptoms).    Marland Kitchen tetrahydrozoline 0.05 % ophthalmic solution Place 1 drop into both eyes 2 (two) times daily as needed (itching).    . [DISCONTINUED] diphenhydrAMINE (BENADRYL) 25 MG tablet Take 1 tablet (25 mg total) by mouth every 6 (six) hours. (Patient not taking: Reported on 01/28/2015) 20 tablet 0  . [DISCONTINUED] omeprazole (PRILOSEC) 20 MG capsule Take 1 capsule (20 mg total) by mouth daily. (Patient not taking: Reported on 10/09/2014) 30 capsule 0   No current facility-administered medications on file prior to visit.   Family History  Problem Relation Age of Onset  . Family history unknown: Yes   Social History   Social History  . Marital Status: Single    Spouse Name: N/A  . Number of Children: N/A  . Years of Education: N/A   Occupational History  . Not on file.   Social History Main Topics  . Smoking status: Former Smoker    Quit date: 10/09/2014  . Smokeless tobacco: Not on file  . Alcohol Use: Yes  Comment: occ  . Drug Use: No  . Sexual Activity: Not on file   Other Topics Concern  . Not on file   Social History Narrative    Review of Systems  HENT: Negative for nosebleeds and sore throat.   Respiratory: Positive for cough, shortness of breath and wheezing. Negative for hemoptysis and sputum production.   Cardiovascular: Negative for chest pain.  Neurological: Negative for headaches.  Psychiatric/Behavioral: Positive for depression. Negative for hallucinations. The patient is not nervous/anxious and does not have insomnia.   All other systems reviewed and are negative.  Objective:   Filed Vitals:    05/20/15 1053  BP: 139/83  Pulse: 80  Temp: 98 F (36.7 C)  Resp: 16    Physical Exam  HENT:  Head: Normocephalic.  Right Ear: External ear normal.  Left Ear: External ear normal.  Mouth/Throat: Oropharynx is clear and moist.  Eyes: Pupils are equal, round, and reactive to light. Right eye exhibits no discharge. Left eye exhibits no discharge.  Cardiovascular: Normal rate, regular rhythm and normal heart sounds.   Pulmonary/Chest: Effort normal. No respiratory distress. He has wheezes. He exhibits no tenderness.  Lymphadenopathy:    He has no cervical adenopathy.     Lab Results  Component Value Date   WBC 7.0 01/28/2015   HGB 14.9 01/28/2015   HCT 43.8 01/28/2015   MCV 97.8 01/28/2015   PLT 276 01/28/2015   Lab Results  Component Value Date   CREATININE 1.79* 01/28/2015   BUN 15 01/28/2015   NA 138 01/28/2015   K 4.6 01/28/2015   CL 101 01/28/2015   CO2 29 01/28/2015    No results found for: HGBA1C Lipid Panel     Component Value Date/Time   CHOL 259* 09/21/2008 2051   TRIG 208* 09/21/2008 2051   HDL 79 09/21/2008 2051   CHOLHDL 3.3 Ratio 09/21/2008 2051   VLDL 42* 09/21/2008 2051   LDLCALC 138* 09/21/2008 2051       Assessment and plan:   Greene was seen today for follow-up.  Diagnoses and all orders for this visit:  Pulmonary emphysema, unspecified emphysema type (HCC) -     albuterol (PROVENTIL HFA;VENTOLIN HFA) 108 (90 BASE) MCG/ACT inhaler; Inhale 2 puffs into the lungs every 6 (six) hours as needed for wheezing or shortness of breath. Patients sounds much better today with less work of breathing. He will continue on Advair for now. I will do close follow ups with patient to prevent ER use until he is able to get in with Pulmonology. Explained that if he is having increased work of breathing/wheezing then he may return to clinic before going to ER and someone will assist him in clinic.  Return in about 8 weeks (around 07/15/2015) for COPD.        Ambrose Finland, NP-C [facility] [phone] 05/20/2015, 11:08 AM

## 2015-05-20 NOTE — Progress Notes (Signed)
Patient here for follow up on his asthma Patient does have an appt. With eligibility today so would like referral To pulmonology

## 2015-05-20 NOTE — Patient Instructions (Signed)
Call once you get Pulmonology so I can send referral

## 2015-05-30 ENCOUNTER — Telehealth: Payer: Self-pay | Admitting: Internal Medicine

## 2015-05-30 DIAGNOSIS — J441 Chronic obstructive pulmonary disease with (acute) exacerbation: Secondary | ICD-10-CM

## 2015-05-30 NOTE — Telephone Encounter (Signed)
Pt. Called requesting a referral to a pulmonologist. Pt. Stated his PCP was going to refer him and he has the [employer]. Please f/u with pt.

## 2015-05-31 ENCOUNTER — Telehealth: Payer: Self-pay

## 2015-05-31 NOTE — Telephone Encounter (Signed)
Returned patient phone call to home number Patient not available Unable to leave message no voice mail at number Referral to pulmonoolgy placed in epic

## 2015-06-11 ENCOUNTER — Ambulatory Visit (INDEPENDENT_AMBULATORY_CARE_PROVIDER_SITE_OTHER): Payer: Self-pay | Admitting: Internal Medicine

## 2015-06-11 ENCOUNTER — Encounter: Payer: Self-pay | Admitting: Internal Medicine

## 2015-06-11 VITALS — BP 130/80 | HR 78 | Ht 71.0 in | Wt 153.0 lb

## 2015-06-11 DIAGNOSIS — J449 Chronic obstructive pulmonary disease, unspecified: Secondary | ICD-10-CM | POA: Insufficient documentation

## 2015-06-11 DIAGNOSIS — Z720 Tobacco use: Secondary | ICD-10-CM

## 2015-06-11 DIAGNOSIS — F17210 Nicotine dependence, cigarettes, uncomplicated: Secondary | ICD-10-CM

## 2015-06-11 MED ORDER — FLUTICASONE FUROATE-VILANTEROL 100-25 MCG/INH IN AEPB: 1.0000 | INHALATION_SPRAY | Freq: Every morning | RESPIRATORY_TRACT | Status: DC

## 2015-06-11 NOTE — Progress Notes (Signed)
Subjective:    Patient ID: Keith Weber, male    DOB: 05-Sep-1970,    MRN: [id number]  HPI  37 yobm active smoker with asthma in child mostly with exercise and did ok just with prn inhalers and around 2011 began feeling like he needed something every day but  advair did not correct his problem even when he could afford it / best rx is duoneb and prednisone referred to pulmonary clinic 06/11/2015 by Dr Luna Glasgow.   06/11/2015 1st [place] [facility] office visit/ Caffie Sotto   Chief Complaint  Patient presents with  . Pulmonary Consult    Referred by [facility]. Pt states he was dxed with Emphysema approx 4 yrs ago.  He states that he feels SOB "all the time" with or without exertion. He uses albuterol inhaler "all throughout the day" and neb at least 2-3 x per day.   mostly dry cough and constant sense of sob / worse with ex / def some better on advair but no insurance and can't afford it but still smoking and not ready to quit   No obvious other patterns in day to day or daytime variabilty or assoc chronic cough or cp or chest tightness, subjective wheeze overt sinus or hb symptoms. No unusual exp hx or h/o childhood pna/  or knowledge of premature birth.  Sleeping ok without nocturnal  or early am exacerbation  of respiratory  c/o's or need for noct saba. Also denies any obvious fluctuation of symptoms with weather or environmental changes or other aggravating or alleviating factors except as outlined above   Current Medications, Allergies, Complete Past Medical History, Past Surgical History, Family History, and Social History were reviewed in [employer] record.              Review of Systems  Constitutional: Negative for fever, chills, activity change, appetite change and unexpected weight change.  HENT: Negative for congestion, dental problem, postnasal drip, rhinorrhea, sneezing, sore throat, trouble swallowing and voice change.   Eyes: Negative for visual  disturbance.  Respiratory: Positive for cough. Negative for choking and shortness of breath.   Cardiovascular: Negative for chest pain and leg swelling.  Gastrointestinal: Negative for nausea, vomiting and abdominal pain.  Genitourinary: Negative for difficulty urinating.  Musculoskeletal: Negative for arthralgias.  Skin: Negative for rash.  Psychiatric/Behavioral: Negative for behavioral problems and confusion.       Objective:   Physical Exam  amb bm nad  Wt Readings from Last 3 Encounters:  06/11/15 153 lb (69.4 kg)  05/20/15 145 lb 12.8 oz (66.134 kg)  05/01/15 145 lb 12.8 oz (66.134 kg)    Vital signs reviewed   HEENT: nl dentition, turbinates, and oropharynx. Nl external ear canals without cough reflex   NECK :  without JVD/Nodes/TM/ nl carotid upstrokes bilaterally   LUNGS: no acc muscle use,  slt distant bs bilaterally s sign wheeze   CV:  RRR  no s3 or murmur or increase in P2, no edema   ABD:  soft and nontender with end insp hoover's sign  in the supine position. No bruits or organomegaly, bowel sounds nl  MS:  warm without deformities, calf tenderness, cyanosis or clubbing  SKIN: warm and dry without lesions    NEURO:  alert, approp, no deficits    I personally reviewed images and agree with radiology impression as follows:  CXR:  04/25/15 Single-view of the chest demonstrates emphysematous changes of the lungs. No focal consolidation, pleural effusion, or pneumothorax. Stable  cardiac silhouette. The osseous structures are grossly unremarkable.           Assessment & Plan:

## 2015-06-11 NOTE — Patient Instructions (Addendum)
BREO is one click each am - take two smooth deep drags off click and you're done for the day   Only use your albuterol as a rescue medication to be used if you can't catch your breath by resting or doing a relaxed purse lip breathing pattern.  - The less you use it, the better it will work when you need it. - Ok to use up to 2 puffs  every 4 hours if you must but call for immediate appointment if use goes up over your usual need - Don't leave home without it !!  (think of it like the spare tire for your car)   The key is to stop smoking completely before smoking completely stops you!   Please schedule a follow up office visit in 6 weeks, call sooner if needed

## 2015-06-12 DIAGNOSIS — F17210 Nicotine dependence, cigarettes, uncomplicated: Secondary | ICD-10-CM | POA: Insufficient documentation

## 2015-06-12 NOTE — Assessment & Plan Note (Addendum)
Symptoms are markedly disproportionate to objective findings and not clear this is a lung problem but pt does appear to have difficult airway management issues. DDX of  difficult airways management all start with A and  include Adherence, Ace Inhibitors, Acid Reflux, Active Sinus Disease, Alpha 1 Antitripsin deficiency, Anxiety masquerading as Airways dz,  ABPA,  allergy(esp in young), Aspiration (esp in elderly), Adverse effects of meds,  Active smokers, A bunch of PE's (a small clot burden can't cause this syndrome unless there is already severe underlying pulm or vascular dz with poor reserve) plus two Bs  = Bronchiectasis and Beta blocker use..and one C= CHF  Adherence is always the initial "prime suspect" and is a multilayered concern that requires a "trust but verify" approach in every patient - starting with knowing how to use medications, especially inhalers, correctly, keeping up with refills and understanding the fundamental difference between maintenance and prns vs those medications only taken for a very short course and then stopped and not refilled.  - issues are mainly financial   Active smoking > see sep a/p  ? Anxiety > usually at the bottom of this list of usual suspects but should be much higher on this pt's based on H and P    ? Allergy > strongly doubt   I had an extended discussion with the patient reviewing all relevant studies completed to date and  lasting 25 minutes of a visit    Each maintenance medication was reviewed in detail including most importantly the difference between maintenance and prns and under what circumstances the prns are to be triggered using an action plan format that is not reflected in the computer generated alphabetically organized AVS.    Please see instructions for details which were reviewed in writing and the patient given a copy highlighting the part that I personally wrote and discussed at today's ov.

## 2015-06-12 NOTE — Assessment & Plan Note (Signed)

## 2015-06-12 NOTE — Addendum Note (Signed)
Addended by: Sandrea HughsWERT, Damian Hofstra B on: 06/12/2015 07:03 AM   Modules accepted: Level of Service

## 2015-07-23 ENCOUNTER — Ambulatory Visit: Payer: Self-pay | Admitting: Internal Medicine

## 2015-07-31 ENCOUNTER — Telehealth: Payer: Self-pay | Admitting: Internal Medicine

## 2015-07-31 ENCOUNTER — Ambulatory Visit: Payer: Self-pay | Attending: Internal Medicine | Admitting: Clinical

## 2015-07-31 DIAGNOSIS — Z658 Other specified problems related to psychosocial circumstances: Secondary | ICD-10-CM

## 2015-07-31 DIAGNOSIS — J449 Chronic obstructive pulmonary disease, unspecified: Secondary | ICD-10-CM

## 2015-07-31 MED ORDER — FLUTICASONE FUROATE-VILANTEROL 100-25 MCG/INH IN AEPB: 1.0000 | INHALATION_SPRAY | Freq: Every morning | RESPIRATORY_TRACT | Status: DC

## 2015-07-31 NOTE — Progress Notes (Signed)
ASSESSMENT: Pt currently experiencing psychosocial stressors. [facility] and would benefit from psychoeducation and supportive counseling regarding coping with symptoms of depression and anxiety related to psychosocial stressors.  Stage of Change: contemplative  PLAN: 1. F/U with behavioral health consultant in two weeks 2. Psychiatric Medications: none. 3. Behavioral recommendation(s):   -Fill out orange card transportation application and follow directions to return paperwork -[place] [place] w/ [place] card -Continue to keep positive outlook on life -Consider reading educational material regarding coping with symptoms of anxiety and depression  SUBJECTIVE: Pt. referred by front desk for transportation/psychosocial:  Pt. reports the following symptoms/concerns: Pt states that he does not want depression/anxiety scores in his medical record, because "I don't want it to look like I'm crazy". His primary concern today is in navigating transportation assistance through the orange card; also admits that [facility], possibly for depression and/or bipolar, but he does not claim those diagnoses. He often thinks about former marriage/relationship issues that bring him down, but he tries to look at the lessons learned as positive for growth.  Duration of problem: today Severity: mild  OBJECTIVE: Orientation & Cognition: Oriented x3. Thought processes normal and appropriate to situation. Mood: appropriate. Affect: appropriate Appearance: appropriate Risk of harm to self or others: no known risk of harm to self or others Substance use: tobacco Assessments administered: PHQ9: 10/ GAD7: 11  Diagnosis: Psychosocial stressors CPT Code: Z65.8 -------------------------------------------- Other(s) present in the room: none  Time spent with patient in exam room: 45  minutes

## 2015-07-31 NOTE — Telephone Encounter (Signed)
Rx has been sent in. Nothing further was needed. 

## 2015-07-31 NOTE — Telephone Encounter (Signed)
Pt requesting referral to another pulmonary disease specialist. Was supposed to follow up in 6 weeks and they have not scheduled him a F/U. Also needs a referral to an optometrist

## 2015-07-31 NOTE — Telephone Encounter (Signed)
Pt don't have insurance and can't be refer to a pulmonary  I'm sorry but we don't have resources for optometry

## 2015-08-26 MED FILL — **BREO ELLIPTA 100-25 MCG I: 100-25MCG | 30 days supply | Qty: 60 | Fill #1

## 2015-10-25 MED FILL — !VENTOLIN HFA INHALER: 108 (90 BAS | 25 days supply | Qty: 18 | Fill #1

## 2015-10-25 MED FILL — **ADVAIR 250/50 DISKUS: 250-50 MCG | 7 days supply | Qty: 14 | Fill #0

## 2015-11-02 ENCOUNTER — Encounter (HOSPITAL_COMMUNITY): Payer: Self-pay | Admitting: Nurse Practitioner

## 2015-11-02 ENCOUNTER — Emergency Department (HOSPITAL_COMMUNITY)
Admission: EM | Admit: 2015-11-02 | Discharge: 2015-11-02 | Discharge status: Against Medical Advice | Payer: Self-pay | Attending: Emergency Medicine | Admitting: Emergency Medicine

## 2015-11-02 DIAGNOSIS — K0889 Other specified disorders of teeth and supporting structures: Secondary | ICD-10-CM | POA: Insufficient documentation

## 2015-11-02 DIAGNOSIS — Z79899 Other long term (current) drug therapy: Secondary | ICD-10-CM | POA: Insufficient documentation

## 2015-11-02 DIAGNOSIS — Z8719 Personal history of other diseases of the digestive system: Secondary | ICD-10-CM | POA: Insufficient documentation

## 2015-11-02 DIAGNOSIS — F17210 Nicotine dependence, cigarettes, uncomplicated: Secondary | ICD-10-CM | POA: Insufficient documentation

## 2015-11-02 DIAGNOSIS — Z880 Allergy status to penicillin: Secondary | ICD-10-CM | POA: Insufficient documentation

## 2015-11-02 DIAGNOSIS — Z7951 Long term (current) use of inhaled steroids: Secondary | ICD-10-CM | POA: Insufficient documentation

## 2015-11-02 DIAGNOSIS — J45901 Unspecified asthma with (acute) exacerbation: Secondary | ICD-10-CM

## 2015-11-02 DIAGNOSIS — M542 Cervicalgia: Secondary | ICD-10-CM | POA: Insufficient documentation

## 2015-11-02 DIAGNOSIS — J441 Chronic obstructive pulmonary disease with (acute) exacerbation: Secondary | ICD-10-CM | POA: Insufficient documentation

## 2015-11-02 MED ORDER — IPRATROPIUM-ALBUTEROL 0.5-2.5 (3) MG/3ML IN SOLN
3.0000 mL | RESPIRATORY_TRACT | Status: DC
  Administered 2015-11-02: 3 mL via RESPIRATORY_TRACT
  Filled 2015-11-02: qty 3

## 2015-11-02 MED ORDER — DEXAMETHASONE SODIUM PHOSPHATE 10 MG/ML IJ SOLN
10.0000 mg | Freq: Once | INTRAMUSCULAR | Status: AC
  Administered 2015-11-02: 10 mg via INTRAMUSCULAR
  Filled 2015-11-02: qty 1

## 2015-11-02 MED ORDER — ALBUTEROL SULFATE HFA 108 (90 BASE) MCG/ACT IN AERS
2.0000 | INHALATION_SPRAY | Freq: Once | RESPIRATORY_TRACT | Status: AC
  Administered 2015-11-02: 2 via RESPIRATORY_TRACT
  Filled 2015-11-02: qty 6.7

## 2015-11-02 MED ORDER — FAMOTIDINE 20 MG PO TABS
20.0000 mg | ORAL_TABLET | Freq: Once | ORAL | Status: AC
  Administered 2015-11-02: 20 mg via ORAL
  Filled 2015-11-02: qty 1

## 2015-11-02 NOTE — ED Notes (Signed)
Pt states he is here for multiple complaints, he does not have insurance and would like the doctor to see him for all of his complaints. C/o neck pain since waking this am. C/o asthma "flare-up" states he does not have his inhalers at home. C/o dental pain "For a while" he has been unable to see the dentist. He is a&ox4, breathing easily

## 2015-11-02 NOTE — ED Provider Notes (Signed)
CSN: [id number]     Arrival date & time 11/02/15  1322 History   First MD Initiated Contact with Patient 11/02/15 1417     Chief Complaint  Patient presents with  . Dental Pain  . Neck Pain  . Asthma     (Consider location/radiation/quality/duration/timing/severity/associated sxs/prior Treatment) HPI Keith Weber is a 45 y.o. male with a history of asthma, COPD, GERD, comes in for evaluation of multiple medical complaints including dental pain, neck pain, GERD, asthma exacerbation, but reports he is most concerned about asthma exacerbation. Reports symptoms have been ongoing and consistently worsening over the past 2 weeks. [facility] and had an appointment on Friday, but missed this appointment. Rescheduled appointment for this coming Monday. Reports he has been out of his breathing treatments. Reports associated intermittent fevers at home, sometimes dry and sometimes productive coughing. No overt chest pain or shortness of breath, leg swelling, hemoptysis. Patient does admit that he is a current every day cigarette smoker. No other alleviating or aggravating factors.  Past Medical History  Diagnosis Date  . Asthma   . Sciatic nerve pain   . COPD (chronic obstructive pulmonary disease) (HCC)   . GERD (gastroesophageal reflux disease)    Past Surgical History  Procedure Laterality Date  . I&d extremity Right 06/16/2014    Procedure: IRRIGATION AND DEBRIDEMENT RIGHT THUMB AND FIRST FINGER AND PINNING ;  Surgeon: Dominica SeverinWilliam Gramig, MD;  Location: [facility] OR;  Service: Orthopedics;  Laterality: Right;  . Irrigation and debridement knee Right 06/16/2014    Procedure: IRRIGATION AND DEBRIDEMENT POSTERIOR KNEE;  Surgeon: Dominica SeverinWilliam Gramig, MD;  Location: [facility] OR;  Service: Orthopedics;  Laterality: Right;  . Laceration repair Right 06/16/2014    Procedure: REPAIR ODF THREE LACERATIONS POSTERIOR KNEE;  Surgeon: Dominica SeverinWilliam Gramig, MD;  Location: [facility] OR;   Service: Orthopedics;  Laterality: Right;   Family History  Problem Relation Age of Onset  . Family history unknown: Yes   Social History  Substance Use Topics  . Smoking status: Current Some Day Smoker -- 0.50 packs/day for 31 years    Types: Cigarettes  . Smokeless tobacco: None  . Alcohol Use: 0.0 oz/week    0 Standard drinks or equivalent per week     Comment: occ    Review of Systems A 10 point review of systems was completed and was negative except for pertinent positives and negatives as mentioned in the history of present illness     Allergies  Shellfish allergy; Amoxicillin; Iodine; and Peanut-containing drug products  Home Medications   Prior to Admission medications   Medication Sig Start Date End Date Taking? Authorizing Provider  albuterol (PROVENTIL HFA;VENTOLIN HFA) 108 (90 BASE) MCG/ACT inhaler Inhale 2 puffs into the lungs every 6 (six) hours as needed for wheezing or shortness of breath. 05/20/15   Ambrose FinlandValerie A Keck, NP  Fluticasone Furoate-Vilanterol (BREO ELLIPTA) 100-25 MCG/INH AEPB Inhale 1 puff into the lungs every morning. 07/31/15   Nyoka CowdenMichael B Wert, MD  ipratropium-albuterol (DUONEB) 0.5-2.5 (3) MG/3ML SOLN Take 3 mLs by nebulization every 6 (six) hours as needed. 05/01/15   Ambrose FinlandValerie A Keck, NP  Multiple Vitamin (MULTIVITAMIN WITH MINERALS) TABS tablet Take 1 tablet by mouth daily.    Historical Provider, MD  tetrahydrozoline 0.05 % ophthalmic solution Place 1 drop into both eyes 2 (two) times daily as needed (itching).    Historical Provider, MD   BP 130/87 mmHg  Pulse 80  Temp(Src) 98.7  F (37.1 C) (Oral)  Resp 20  SpO2 95% Physical Exam  Constitutional: He is oriented to person, place, and time. He appears well-developed and well-nourished.  Overall well-appearing African-American male  HENT:  Head: Normocephalic and atraumatic.  Mouth/Throat: Oropharynx is clear and moist.  Oropharynx is clear and moist, dental pain noted to maxillary right  incisor. No fluctuance, redness or evidence of drainable abscess. No trismus, glossal elevation, unilateral tonsillar swelling. Moist mucous membranes. Tolerating secretions well  Eyes: Conjunctivae are normal. Pupils are equal, round, and reactive to light. Right eye exhibits no discharge. Left eye exhibits no discharge. No scleral icterus.  Neck: Normal range of motion. Neck supple.  Cardiovascular: Normal rate, regular rhythm and normal heart sounds.   Pulmonary/Chest: Effort normal. No respiratory distress.  Diffuse inspiratory and expiratory wheezing in all fields, oxygen saturations 95% on room air. No other appreciable adventitious lung sounds. No respiratory distress.  Abdominal: Soft. There is no tenderness.  Musculoskeletal: Normal range of motion. He exhibits no edema or tenderness.  Neurological: He is alert and oriented to person, place, and time.  Cranial Nerves II-XII grossly intact  Skin: Skin is warm and dry. No rash noted.  Psychiatric: He has a normal mood and affect.  Nursing note and vitals reviewed.   ED Course  Procedures (including critical care time) Labs Review Labs Reviewed - No data to display  Imaging Review No results found. I have personally reviewed and evaluated these images and lab results as part of my medical decision-making.   EKG Interpretation None     Meds given in ED:  Medications  ipratropium-albuterol (DUONEB) 0.5-2.5 (3) MG/3ML nebulizer solution 3 mL (3 mLs Nebulization Given 11/02/15 1433)  ipratropium-albuterol (DUONEB) 0.5-2.5 (3) MG/3ML nebulizer solution 3 mL (3 mLs Nebulization Given 11/02/15 1518)  dexamethasone (DECADRON) injection 10 mg (10 mg Intramuscular Given 11/02/15 1433)  albuterol (PROVENTIL HFA;VENTOLIN HFA) 108 (90 Base) MCG/ACT inhaler 2 puff (2 puffs Inhalation Given 11/02/15 1433)  famotidine (PEPCID) tablet 20 mg (20 mg Oral Given 11/02/15 1454)    Discharge Medication List as of 11/02/2015  4:06 PM     Filed  Vitals:   11/02/15 1350  BP: 130/87  Pulse: 80  Temp: 98.7 F (37.1 C)  TempSrc: Oral  Resp: 20  SpO2: 95%    MDM  Keith Weber is a 45 y.o. male who presents complaining of multiple medical problems. Most concerned about asthma exacerbation. He is hemodynamically stable and afebrile on arrival. On exam, he does have diffuse wheezing. Will give breathing treatment, IM Decadron 10 mg. Also given albuterol inhaler for home use. [facility], missed scheduled appointment on Friday, rescheduled appointment for Monday and agrees to follow-up. Patient still with diffuse wheezing, given second breathing treatment. Patient leaves AMA after second breathing treatment. Final diagnoses:  Asthma exacerbation        Joycie Peek, PA-C 11/02/15 1624  Azalia Bilis, MD 11/02/15 [phone]

## 2015-11-02 NOTE — ED Notes (Signed)
Pt called nurse to advise that he is leaving, does not want to wait on PA.  [facility]

## 2015-11-15 ENCOUNTER — Emergency Department (HOSPITAL_COMMUNITY): Payer: Self-pay

## 2015-11-15 ENCOUNTER — Encounter (HOSPITAL_COMMUNITY): Payer: Self-pay

## 2015-11-15 ENCOUNTER — Emergency Department (HOSPITAL_COMMUNITY)
Admission: EM | Admit: 2015-11-15 | Discharge: 2015-11-16 | Disposition: A | Discharge status: Home / Self Care | Payer: Self-pay | Attending: Emergency Medicine | Admitting: Emergency Medicine

## 2015-11-15 DIAGNOSIS — Z8719 Personal history of other diseases of the digestive system: Secondary | ICD-10-CM | POA: Insufficient documentation

## 2015-11-15 DIAGNOSIS — Z8739 Personal history of other diseases of the musculoskeletal system and connective tissue: Secondary | ICD-10-CM | POA: Insufficient documentation

## 2015-11-15 DIAGNOSIS — Z7951 Long term (current) use of inhaled steroids: Secondary | ICD-10-CM | POA: Insufficient documentation

## 2015-11-15 DIAGNOSIS — Z880 Allergy status to penicillin: Secondary | ICD-10-CM | POA: Insufficient documentation

## 2015-11-15 DIAGNOSIS — J441 Chronic obstructive pulmonary disease with (acute) exacerbation: Secondary | ICD-10-CM | POA: Insufficient documentation

## 2015-11-15 DIAGNOSIS — Z79899 Other long term (current) drug therapy: Secondary | ICD-10-CM | POA: Insufficient documentation

## 2015-11-15 DIAGNOSIS — F17210 Nicotine dependence, cigarettes, uncomplicated: Secondary | ICD-10-CM | POA: Insufficient documentation

## 2015-11-15 MED ORDER — IPRATROPIUM BROMIDE 0.02 % IN SOLN
0.5000 mg | Freq: Once | RESPIRATORY_TRACT | Status: AC
  Administered 2015-11-15: 0.5 mg via RESPIRATORY_TRACT
  Filled 2015-11-15: qty 2.5

## 2015-11-15 MED ORDER — ALBUTEROL SULFATE (2.5 MG/3ML) 0.083% IN NEBU
10.0000 mg | INHALATION_SOLUTION | Freq: Once | RESPIRATORY_TRACT | Status: AC
  Administered 2015-11-15: 10 mg via RESPIRATORY_TRACT
  Filled 2015-11-15: qty 12

## 2015-11-15 MED ORDER — ALBUTEROL SULFATE HFA 108 (90 BASE) MCG/ACT IN AERS
2.0000 | INHALATION_SPRAY | RESPIRATORY_TRACT | Status: DC | PRN
  Administered 2015-11-15: 2 via RESPIRATORY_TRACT
  Filled 2015-11-15: qty 6.7

## 2015-11-15 MED ORDER — AZITHROMYCIN 250 MG PO TABS
500.0000 mg | ORAL_TABLET | Freq: Once | ORAL | Status: AC
  Administered 2015-11-15: 500 mg via ORAL
  Filled 2015-11-15: qty 2

## 2015-11-15 NOTE — ED Provider Notes (Signed)
CSN: [id number]     Arrival date & time 11/15/15  2249 History   First MD Initiated Contact with Patient 11/15/15 2259     Chief Complaint  Patient presents with  . Shortness of Breath     (Consider location/radiation/quality/duration/timing/severity/associated sxs/prior Treatment) HPI Patient to the ER by EMS with PMH of asthma, COPD, GERD, sciatic nerve pain. He has been seen before for asthma exacerbation and was last seen on 11/02/2015 in which he left AMA. Per EMS he called out for help due to SOB. He feels like allergies are the cause of his exacerbation. He has been taking home treatments for the past 4 days but has been unable to fully recover and the wheezing and SOB returns. He is on a 10 mg albuterol Neb and 0.5 mg of Atrovent and 125 mg Solumedrol IV.  After a breathing treatment on my initial evaluation his symptoms are significantly improved with O2 sats at 100 % during breathing treatments.  PCP: Ambrose Finland, NP  Nhan Qualley is a 45 y.o.  male  ROS: The patient denies diaphoresis, fever, headache, weakness (general or focal), confusion, change of vision,  dysphagia, aphagia, abdominal pains, nausea, vomiting, diarrhea, lower extremity swelling, rash, neck pain, chest pain   Past Medical History  Diagnosis Date  . Asthma   . Sciatic nerve pain   . COPD (chronic obstructive pulmonary disease) (HCC)   . GERD (gastroesophageal reflux disease)    Past Surgical History  Procedure Laterality Date  . I&d extremity Right 06/16/2014    Procedure: IRRIGATION AND DEBRIDEMENT RIGHT THUMB AND FIRST FINGER AND PINNING ;  Surgeon: Dominica Severin, MD;  Location: [facility] OR;  Service: Orthopedics;  Laterality: Right;  . Irrigation and debridement knee Right 06/16/2014    Procedure: IRRIGATION AND DEBRIDEMENT POSTERIOR KNEE;  Surgeon: Dominica Severin, MD;  Location: [facility] OR;  Service: Orthopedics;  Laterality: Right;  . Laceration repair Right 06/16/2014    Procedure: REPAIR ODF THREE  LACERATIONS POSTERIOR KNEE;  Surgeon: Dominica Severin, MD;  Location: [facility] OR;  Service: Orthopedics;  Laterality: Right;   Family History  Problem Relation Age of Onset  . Family history unknown: Yes   Social History  Substance Use Topics  . Smoking status: Current Some Day Smoker -- 0.50 packs/day for 31 years    Types: Cigarettes  . Smokeless tobacco: None  . Alcohol Use: 0.0 oz/week    0 Standard drinks or equivalent per week     Comment: occ    Review of Systems  Review of Systems All other systems negative except as documented in the HPI. All pertinent positives and negatives as reviewed in the HPI.   Allergies  Shellfish allergy; Amoxicillin; Iodine; and Peanut-containing drug products  Home Medications   Prior to Admission medications   Medication Sig Start Date End Date Taking? Authorizing Provider  albuterol (PROVENTIL HFA;VENTOLIN HFA) 108 (90 BASE) MCG/ACT inhaler Inhale 2 puffs into the lungs every 6 (six) hours as needed for wheezing or shortness of breath. 05/20/15  Yes Ambrose Finland, NP  Fluticasone Furoate-Vilanterol (BREO ELLIPTA) 100-25 MCG/INH AEPB Inhale 1 puff into the lungs every morning. 07/31/15  Yes Nyoka Cowden, MD  ipratropium-albuterol (DUONEB) 0.5-2.5 (3) MG/3ML SOLN Take 3 mLs by nebulization every 6 (six) hours as needed. 05/01/15  Yes Ambrose Finland, NP  Multiple Vitamin (MULTIVITAMIN WITH MINERALS) TABS tablet Take 1 tablet by mouth daily.   Yes Historical Provider, MD  tetrahydrozoline 0.05 % ophthalmic solution Place 1  drop into both eyes 2 (two) times daily as needed (itching).   Yes Historical Provider, MD  albuterol (PROVENTIL) (5 MG/ML) 0.5% nebulizer solution Take 0.5 mLs (2.5 mg total) by nebulization every 6 (six) hours as needed for wheezing or shortness of breath. 11/16/15   Melvinia Ashby Neva SeatGreene, PA-C  azithromycin (ZITHROMAX) 250 MG tablet Take 1 tablet (250 mg total) by mouth daily. Take 1 tab every day until finished. 11/16/15   Jakeria Caissie Neva SeatGreene,  PA-C  predniSONE (DELTASONE) 10 MG tablet Take 2 tablets (20 mg total) by mouth daily. 11/16/15   Mitzie Marlar Neva SeatGreene, PA-C   BP 150/93 mmHg  Pulse 87  Temp(Src) 99.8 F (37.7 C) (Oral)  Resp 17  Ht 5\' 11"  (1.803 m)  Wt 72.576 kg  BMI 22.33 kg/m2  SpO2 100% Physical Exam  Constitutional: He appears well-developed and well-nourished. No distress.  HENT:  Head: Normocephalic and atraumatic.  Right Ear: Tympanic membrane and ear canal normal.  Left Ear: Tympanic membrane and ear canal normal.  Nose: Nose normal.  Mouth/Throat: Uvula is midline, oropharynx is clear and moist and mucous membranes are normal.  Eyes: Pupils are equal, round, and reactive to light.  Neck: Normal range of motion. Neck supple.  Cardiovascular: Normal rate and regular rhythm.   Pulmonary/Chest: Effort normal. No accessory muscle usage. No tachypnea and no bradypnea. No respiratory distress. He has no decreased breath sounds. He has wheezes (diffuse expiratory wheezing). He has rhonchi. He has no rales.  Abdominal: Soft.  No signs of abdominal distention  Musculoskeletal:  No LE swelling  Neurological: He is alert.  Acting at baseline  Skin: Skin is warm and dry. No rash noted.  Nursing note and vitals reviewed.   ED Course  Procedures (including critical care time) Labs Review Labs Reviewed - No data to display  Imaging Review Dg Chest 2 View  11/15/2015  CLINICAL DATA:  Dyspnea. EXAM: CHEST  2 VIEW COMPARISON:  04/25/2015 FINDINGS: There is moderate hyperinflation, unchanged. There is diffuse chronic appearing interstitial coarsening. No consolidation. No effusions. Normal pulmonary vasculature. Normal heart size. Hilar and mediastinal contours are unremarkable and unchanged. IMPRESSION: Marked hyperinflation and chronic interstitial coarsening. No evidence of a superimposed acute cardiopulmonary process. Electronically Signed   By: Ellery Plunkaniel R Mitchell M.D.   On: 11/15/2015 23:59   I have personally reviewed  and evaluated these images and lab results as part of my medical decision-making.   EKG Interpretation None      MDM   Final diagnoses:  COPD exacerbation (HCC)    Patient with COPD exacerbation, significantly improved after two 10mg  Albuterol and Atrovent nebulizer treatments. O2 is 100% on room air, no stridor or increased effort of breathing.  Medications  albuterol (PROVENTIL HFA;VENTOLIN HFA) 108 (90 Base) MCG/ACT inhaler 2 puff (2 puffs Inhalation Given 11/15/15 2340)  albuterol (PROVENTIL) (2.5 MG/3ML) 0.083% nebulizer solution 10 mg (10 mg Nebulization Given 11/15/15 2350)  azithromycin (ZITHROMAX) tablet 500 mg (500 mg Oral Given 11/15/15 2339)  ipratropium (ATROVENT) nebulizer solution 0.5 mg (0.5 mg Nebulization Given 11/15/15 2351)    Will dc with Azithromycin, Prednisone and nebulizer solution and HFA. Discussed return precautions and need to follow-up with PCP.    Marlon Peliffany Dom Haverland, PA-C 11/16/15 0013  Nelva Nayobert Beaton, MD 11/16/15 [phone]

## 2015-11-15 NOTE — ED Notes (Signed)
Per EMS pt from home called out for shortness of breath. Patient has recent dx of COPD and allergies have caused exacerbation. Patient has been taking home neb treatments x4-5 days without full relief. Upon EMS arrival patient had expiratory and inspiratory wheezing. EMS gave 10mg  albuterol neb and 0.5mg  of atrovent neb. Patient has had 125mg  solumederol IV.

## 2015-11-16 MED ORDER — AZITHROMYCIN 250 MG PO TABS: 250.0000 mg | ORAL_TABLET | Freq: Every day | ORAL | Status: DC

## 2015-11-16 MED ORDER — PREDNISONE 10 MG PO TABS: 20.0000 mg | ORAL_TABLET | Freq: Every day | ORAL | Status: DC

## 2015-11-16 MED ORDER — ALBUTEROL SULFATE (5 MG/ML) 0.5% IN NEBU: 2.5000 mg | INHALATION_SOLUTION | Freq: Four times a day (QID) | RESPIRATORY_TRACT | Status: DC | PRN

## 2015-11-16 MED ORDER — IPRATROPIUM-ALBUTEROL 0.5-2.5 (3) MG/3ML IN SOLN: 3.0000 mL | Freq: Four times a day (QID) | RESPIRATORY_TRACT | Status: DC | PRN

## 2015-11-16 NOTE — Discharge Instructions (Signed)

## 2015-11-16 NOTE — ED Notes (Signed)
Pt expressed frustration at treatment given, though expressed no specific points of issue. I attempted to answer pt's questions and allow him to express his frustrations, but this seemed to have little effect. Pt departed in NAD, ambulatory and speaking easily between breaths.

## 2015-11-20 ENCOUNTER — Emergency Department (HOSPITAL_COMMUNITY)
Admission: EM | Admit: 2015-11-20 | Discharge: 2015-11-20 | Disposition: A | Discharge status: Home / Self Care | Payer: Self-pay | Attending: Emergency Medicine | Admitting: Emergency Medicine

## 2015-11-20 ENCOUNTER — Emergency Department (HOSPITAL_COMMUNITY): Payer: Self-pay

## 2015-11-20 ENCOUNTER — Encounter (HOSPITAL_COMMUNITY): Payer: Self-pay | Admitting: Emergency Medicine

## 2015-11-20 DIAGNOSIS — Z8719 Personal history of other diseases of the digestive system: Secondary | ICD-10-CM | POA: Insufficient documentation

## 2015-11-20 DIAGNOSIS — J441 Chronic obstructive pulmonary disease with (acute) exacerbation: Secondary | ICD-10-CM | POA: Insufficient documentation

## 2015-11-20 DIAGNOSIS — J45901 Unspecified asthma with (acute) exacerbation: Secondary | ICD-10-CM

## 2015-11-20 DIAGNOSIS — Z880 Allergy status to penicillin: Secondary | ICD-10-CM | POA: Insufficient documentation

## 2015-11-20 DIAGNOSIS — F17210 Nicotine dependence, cigarettes, uncomplicated: Secondary | ICD-10-CM | POA: Insufficient documentation

## 2015-11-20 DIAGNOSIS — Z79899 Other long term (current) drug therapy: Secondary | ICD-10-CM | POA: Insufficient documentation

## 2015-11-20 MED ORDER — ALBUTEROL SULFATE (2.5 MG/3ML) 0.083% IN NEBU
5.0000 mg | INHALATION_SOLUTION | Freq: Once | RESPIRATORY_TRACT | Status: AC
  Administered 2015-11-20: 5 mg via RESPIRATORY_TRACT
  Filled 2015-11-20: qty 6

## 2015-11-20 MED ORDER — PREDNISONE 20 MG PO TABS: 60.0000 mg | ORAL_TABLET | Freq: Once | ORAL | Status: DC

## 2015-11-20 MED ORDER — ALBUTEROL SULFATE HFA 108 (90 BASE) MCG/ACT IN AERS: 2.0000 | INHALATION_SPRAY | RESPIRATORY_TRACT | Status: DC | PRN

## 2015-11-20 MED FILL — ?AZITHROMYCIN 250 MG TABLET: 250 MG | 4 days supply | Qty: 4 | Fill #0

## 2015-11-20 MED FILL — IPRAT-ALBUT 0.5-3(2.5) MG/3: 0.5-2.5 (3) | 30 days supply | Qty: 180 | Fill #1

## 2015-11-20 MED FILL — predniSONE 10 MG TABS: 10 | 6 days supply | Qty: 21 | Fill #0

## 2015-11-20 MED FILL — !VENTOLIN HFA INHALER: 108 (90 BAS | 25 days supply | Qty: 18 | Fill #2

## 2015-11-20 NOTE — ED Provider Notes (Signed)
CSN: [id number]     Arrival date & time 11/20/15  0437 History   First MD Initiated Contact with Patient 11/20/15 [id number]     Chief Complaint  Patient presents with  . Shortness of Breath     (Consider location/radiation/quality/duration/timing/severity/associated sxs/prior Treatment) HPI Comments: Patient presents with an asthma exacerbation. He has a history of asthma and COPD. He states over the last 2-3 days he's had worsening wheezing and shortness of breath. He has a cough is mildly productive. He's felt hot but doesn't have any known fevers. There is no nausea or vomiting. No chest pain. No leg swelling. He was seen here on April 7 for similar symptoms. He was given a prescription for prednisone, nebulizer solution as well as an albuterol inhaler and Zithromax. He hasn't filled any of these prescriptions. He states he's waiting for the [facility] pharmacy to open this morning. When asked him why he didn't fill them yesterday, he said he had things to do. In addition, he states he didn't eat prior to coming here to the emergency room and wants something to eat and drink.  Patient is a 45 y.o. male presenting with shortness of breath.  Shortness of Breath Associated symptoms: cough and wheezing   Associated symptoms: no abdominal pain, no chest pain, no diaphoresis, no fever, no headaches, no rash and no vomiting     Past Medical History  Diagnosis Date  . Asthma   . Sciatic nerve pain   . COPD (chronic obstructive pulmonary disease) (HCC)   . GERD (gastroesophageal reflux disease)    Past Surgical History  Procedure Laterality Date  . I&d extremity Right 06/16/2014    Procedure: IRRIGATION AND DEBRIDEMENT RIGHT THUMB AND FIRST FINGER AND PINNING ;  Surgeon: Dominica SeverinWilliam Gramig, MD;  Location: [facility] OR;  Service: Orthopedics;  Laterality: Right;  . Irrigation and debridement knee Right 06/16/2014    Procedure: IRRIGATION AND DEBRIDEMENT POSTERIOR KNEE;  Surgeon: Dominica SeverinWilliam  Gramig, MD;  Location: [facility] OR;  Service: Orthopedics;  Laterality: Right;  . Laceration repair Right 06/16/2014    Procedure: REPAIR ODF THREE LACERATIONS POSTERIOR KNEE;  Surgeon: Dominica SeverinWilliam Gramig, MD;  Location: [facility] OR;  Service: Orthopedics;  Laterality: Right;   Family History  Problem Relation Age of Onset  . Family history unknown: Yes   Social History  Substance Use Topics  . Smoking status: Current Some Day Smoker -- 0.50 packs/day for 31 years    Types: Cigarettes  . Smokeless tobacco: None  . Alcohol Use: 0.0 oz/week    0 Standard drinks or equivalent per week     Comment: occ    Review of Systems  Constitutional: Negative for fever, chills, diaphoresis and fatigue.  HENT: Negative for congestion, rhinorrhea and sneezing.   Eyes: Negative.   Respiratory: Positive for cough, shortness of breath and wheezing. Negative for chest tightness.   Cardiovascular: Negative for chest pain and leg swelling.  Gastrointestinal: Negative for nausea, vomiting, abdominal pain, diarrhea and blood in stool.  Genitourinary: Negative for frequency, hematuria, flank pain and difficulty urinating.  Musculoskeletal: Negative for back pain and arthralgias.  Skin: Negative for rash.  Neurological: Negative for dizziness, speech difficulty, weakness, numbness and headaches.      Allergies  Shellfish allergy; Amoxicillin; Iodine; and Peanut-containing drug products  Home Medications   Prior to Admission medications   Medication Sig Start Date End Date Taking? Authorizing Provider  albuterol (PROVENTIL HFA;VENTOLIN HFA) 108 (90 BASE) MCG/ACT inhaler Inhale 2 puffs into  the lungs every 6 (six) hours as needed for wheezing or shortness of breath. 05/20/15  Yes Ambrose Finland, NP  albuterol (PROVENTIL) (5 MG/ML) 0.5% nebulizer solution Take 0.5 mLs (2.5 mg total) by nebulization every 6 (six) hours as needed for wheezing or shortness of breath. 11/16/15  Yes Tiffany Neva Seat, PA-C  Fluticasone  Furoate-Vilanterol (BREO ELLIPTA) 100-25 MCG/INH AEPB Inhale 1 puff into the lungs every morning. 07/31/15  Yes Nyoka Cowden, MD  Multiple Vitamin (MULTIVITAMIN WITH MINERALS) TABS tablet Take 1 tablet by mouth daily.   Yes Historical Provider, MD  tetrahydrozoline 0.05 % ophthalmic solution Place 1 drop into both eyes 2 (two) times daily as needed (itching).   Yes Historical Provider, MD  azithromycin (ZITHROMAX) 250 MG tablet Take 1 tablet (250 mg total) by mouth daily. Take 1 tab every day until finished. 11/16/15   Tiffany Neva Seat, PA-C  ipratropium-albuterol (DUONEB) 0.5-2.5 (3) MG/3ML SOLN Take 3 mLs by nebulization every 6 (six) hours as needed. 11/16/15   Tiffany Neva Seat, PA-C  predniSONE (DELTASONE) 10 MG tablet Take 2 tablets (20 mg total) by mouth daily. 11/16/15   Tiffany Neva Seat, PA-C   BP 127/75 mmHg  Pulse 92  Temp(Src) 97.5 F (36.4 C) (Oral)  Resp 18  Ht  (1.803 m)  Wt 160 lb (72.576 kg)  BMI 22.33 kg/m2  SpO2 99% Physical Exam  Constitutional: He is oriented to person, place, and time. He appears well-developed and well-nourished.  HENT:  Head: Normocephalic and atraumatic.  Eyes: Pupils are equal, round, and reactive to light.  Neck: Normal range of motion. Neck supple.  Cardiovascular: Normal rate, regular rhythm and normal heart sounds.   Pulmonary/Chest: Effort normal. No respiratory distress. He has wheezes. He has no rales. He exhibits no tenderness.  No increased WOB  Abdominal: Soft. Bowel sounds are normal. There is no tenderness. There is no rebound and no guarding.  Musculoskeletal: Normal range of motion. He exhibits no edema.  Lymphadenopathy:    He has no cervical adenopathy.  Neurological: He is alert and oriented to person, place, and time.  Skin: Skin is warm and dry. No rash noted.  Psychiatric: He has a normal mood and affect.    ED Course  Procedures (including critical care time) Labs Review Labs Reviewed - No data to display  Imaging  Review Dg Chest 2 View  11/20/2015  CLINICAL DATA:  Dyspnea tonight. EXAM: CHEST  2 VIEW COMPARISON:  11/15/2015 FINDINGS: There is hyperinflation. The lungs are clear. The pulmonary vasculature is normal. Hilar, mediastinal and cardiac contours are unremarkable unchanged. There is no pleural effusion. IMPRESSION: Hyperinflation.  No consolidation.  No effusions. Electronically Signed   By: Ellery Plunk M.D.   On: 11/20/2015 06:15   I have personally reviewed and evaluated these images and lab results as part of my medical decision-making.   EKG Interpretation   Date/Time:  Wednesday November 20 2015 04:47:23 EDT Ventricular Rate:  88 PR Interval:  143 QRS Duration: 85 QT Interval:  366 QTC Calculation: 443 R Axis:   87 Text Interpretation:  Sinus rhythm Consider left ventricular hypertrophy  ST elevation suggests acute pericarditis since last tracing no significant  change Confirmed by Khaliya Golinski  MD, Snigdha Howser ([id number]) on 11/20/2015 6:08:51 AM      MDM   Final diagnoses:  Asthma exacerbation    Patient presents with shortness of breath and wheezing consistent with his past asthma exacerbations. He was given nebulizer treatment in the ED as well as  1 by EMS. He was also given solumedrol by EMS. He feels like he is pretty much back to baseline. He still has some mild residual wheezing but has good air movement. He is maintaining normal oxygen saturations. He had a chest x-ray which is negative for infection. He was previously given prescriptions for prednisone, Zithromax and albuterol nebulizer solution. He still has this prescription I encouraged him to get it filled. He is advised to follow-up with his PCP or return here as needed for any worsening symptoms.    Rolan Bucco, MD 11/20/15 [phone]

## 2015-11-20 NOTE — Discharge Instructions (Signed)
Asthma, Adult Asthma is a recurring condition in which the airways tighten and narrow. Asthma can make it difficult to breathe. It can cause coughing, wheezing, and shortness of breath. Asthma episodes, also called asthma attacks, range from minor to life-threatening. Asthma cannot be cured, but medicines and lifestyle changes can help control it. CAUSES Asthma is believed to be caused by inherited (genetic) and environmental factors, but its exact cause is unknown. Asthma may be triggered by allergens, lung infections, or irritants in the air. Asthma triggers are different for each person. Common triggers include:   Animal dander.  Dust mites.  Cockroaches.  Pollen from trees or grass.  Mold.  Smoke.  Air pollutants such as dust, household cleaners, hair sprays, aerosol sprays, paint fumes, strong chemicals, or strong odors.  Cold air, weather changes, and winds (which increase molds and pollens in the air).  Strong emotional expressions such as crying or laughing hard.  Stress.  Certain medicines (such as aspirin) or types of drugs (such as beta-blockers).  Sulfites in foods and drinks. Foods and drinks that may contain sulfites include dried fruit, potato chips, and sparkling grape juice.  Infections or inflammatory conditions such as the flu, a cold, or an inflammation of the nasal membranes (rhinitis).  Gastroesophageal reflux disease (GERD).  Exercise or strenuous activity. SYMPTOMS Symptoms may occur immediately after asthma is triggered or many hours later. Symptoms include:  Wheezing.  Excessive nighttime or early morning coughing.  Frequent or severe coughing with a common cold.  Chest tightness.  Shortness of breath. DIAGNOSIS  The diagnosis of asthma is made by a review of your medical history and a physical exam. Tests may also be performed. These may include:  Lung function studies. These tests show how much air you breathe in and out.  Allergy  tests.  Imaging tests such as X-rays. TREATMENT  Asthma cannot be cured, but it can usually be controlled. Treatment involves identifying and avoiding your asthma triggers. It also involves medicines. There are 2 classes of medicine used for asthma treatment:   Controller medicines. These prevent asthma symptoms from occurring. They are usually taken every day.  Reliever or rescue medicines. These quickly relieve asthma symptoms. They are used as needed and provide short-term relief. Your health care provider will help you create an asthma action plan. An asthma action plan is a written plan for managing and treating your asthma attacks. It includes a list of your asthma triggers and how they may be avoided. It also includes information on when medicines should be taken and when their dosage should be changed. An action plan may also involve the use of a device called a peak flow meter. A peak flow meter measures how well the lungs are working. It helps you monitor your condition. HOME CARE INSTRUCTIONS   Take medicines only as directed by your health care provider. Speak with your health care provider if you have questions about how or when to take the medicines.  Use a peak flow meter as directed by your health care provider. Record and keep track of readings.  Understand and use the action plan to help minimize or stop an asthma attack without needing to seek medical care.  Control your home environment in the following ways to help prevent asthma attacks:  Do not smoke. Avoid being exposed to secondhand smoke.  Change your heating and air conditioning filter regularly.  Limit your use of fireplaces and wood stoves.  Get rid of pests (such as roaches   and mice) and their droppings.  Throw away plants if you see mold on them.  Clean your floors and dust regularly. Use unscented cleaning products.  Try to have someone else vacuum for you regularly. Stay out of rooms while they are  being vacuumed and for a short while afterward. If you vacuum, use a dust mask from a hardware store, a double-layered or microfilter vacuum cleaner bag, or a vacuum cleaner with a HEPA filter.  Replace carpet with wood, tile, or vinyl flooring. Carpet can trap dander and dust.  Use allergy-proof pillows, mattress covers, and box spring covers.  Wash bed sheets and blankets every week in hot water and dry them in a dryer.  Use blankets that are made of polyester or cotton.  Clean bathrooms and kitchens with bleach. If possible, have someone repaint the walls in these rooms with mold-resistant paint. Keep out of the rooms that are being cleaned and painted.  Wash hands frequently. SEEK MEDICAL CARE IF:   You have wheezing, shortness of breath, or a cough even if taking medicine to prevent attacks.  The colored mucus you cough up (sputum) is thicker than usual.  Your sputum changes from clear or white to yellow, green, gray, or bloody.  You have any problems that may be related to the medicines you are taking (such as a rash, itching, swelling, or trouble breathing).  You are using a reliever medicine more than 2-3 times per week.  Your peak flow is still at 50-79% of your personal best after following your action plan for 1 hour.  You have a fever. SEEK IMMEDIATE MEDICAL CARE IF:   You seem to be getting worse and are unresponsive to treatment during an asthma attack.  You are short of breath even at rest.  You get short of breath when doing very little physical activity.  You have difficulty eating, drinking, or talking due to asthma symptoms.  You develop chest pain.  You develop a fast heartbeat.  You have a bluish color to your lips or fingernails.  You are light-headed, dizzy, or faint.  Your peak flow is less than 50% of your personal best.   This information is not intended to replace advice given to you by your health care provider. Make sure you discuss any  questions you have with your health care provider.   Document Released: 07/27/2005 Document Revised: 04/17/2015 Document Reviewed: 02/23/2013 Elsevier Interactive Patient Education 2016 [employer]  

## 2015-11-20 NOTE — ED Notes (Signed)
Per EMS, pt woke up SOB, stated he experienced chest tightness from "not being able to breath."  Wheezing heard in lungs, was given total of 15mg  albuteral, 1mg  Atrovent & 125 of Solu-med.  Hx of Asthma and COPD.  Pt reports that he has "been sick for a week, cough for a week."

## 2015-12-11 MED FILL — **BREO ELLIPTA 100-25 MCG I: 100-25MCG | 30 days supply | Qty: 60 | Fill #2

## 2015-12-11 MED FILL — IPRAT-ALBUT 0.5-3(2.5) MG/3: 0.5-2.5 (3) | 30 days supply | Qty: 360 | Fill #0

## 2015-12-12 ENCOUNTER — Other Ambulatory Visit: Payer: Self-pay | Admitting: Internal Medicine

## 2015-12-25 IMAGING — CR DG HAND COMPLETE 3+V*R*
3 series · 3 of 3 positions shown · non-contrast
Comparison: Concurrently obtained radiographs of the right knee

CLINICAL DATA: Dogbite, right hand pain

EXAM:
RIGHT HAND - COMPLETE 3+ VIEW

[x hand pa right]
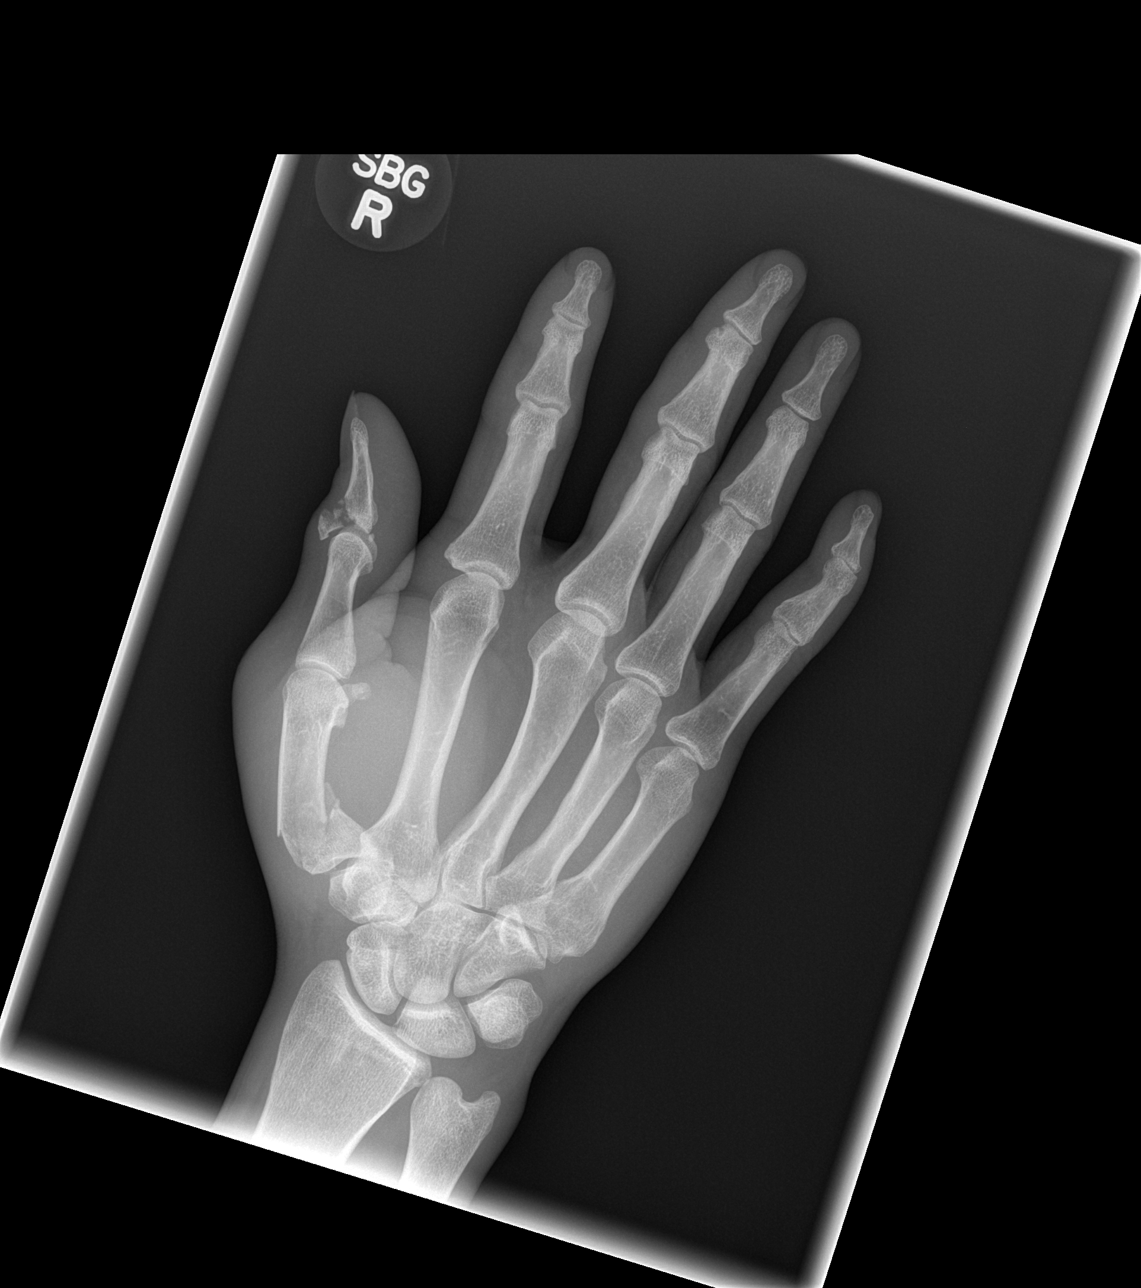

[x hand oblique right]
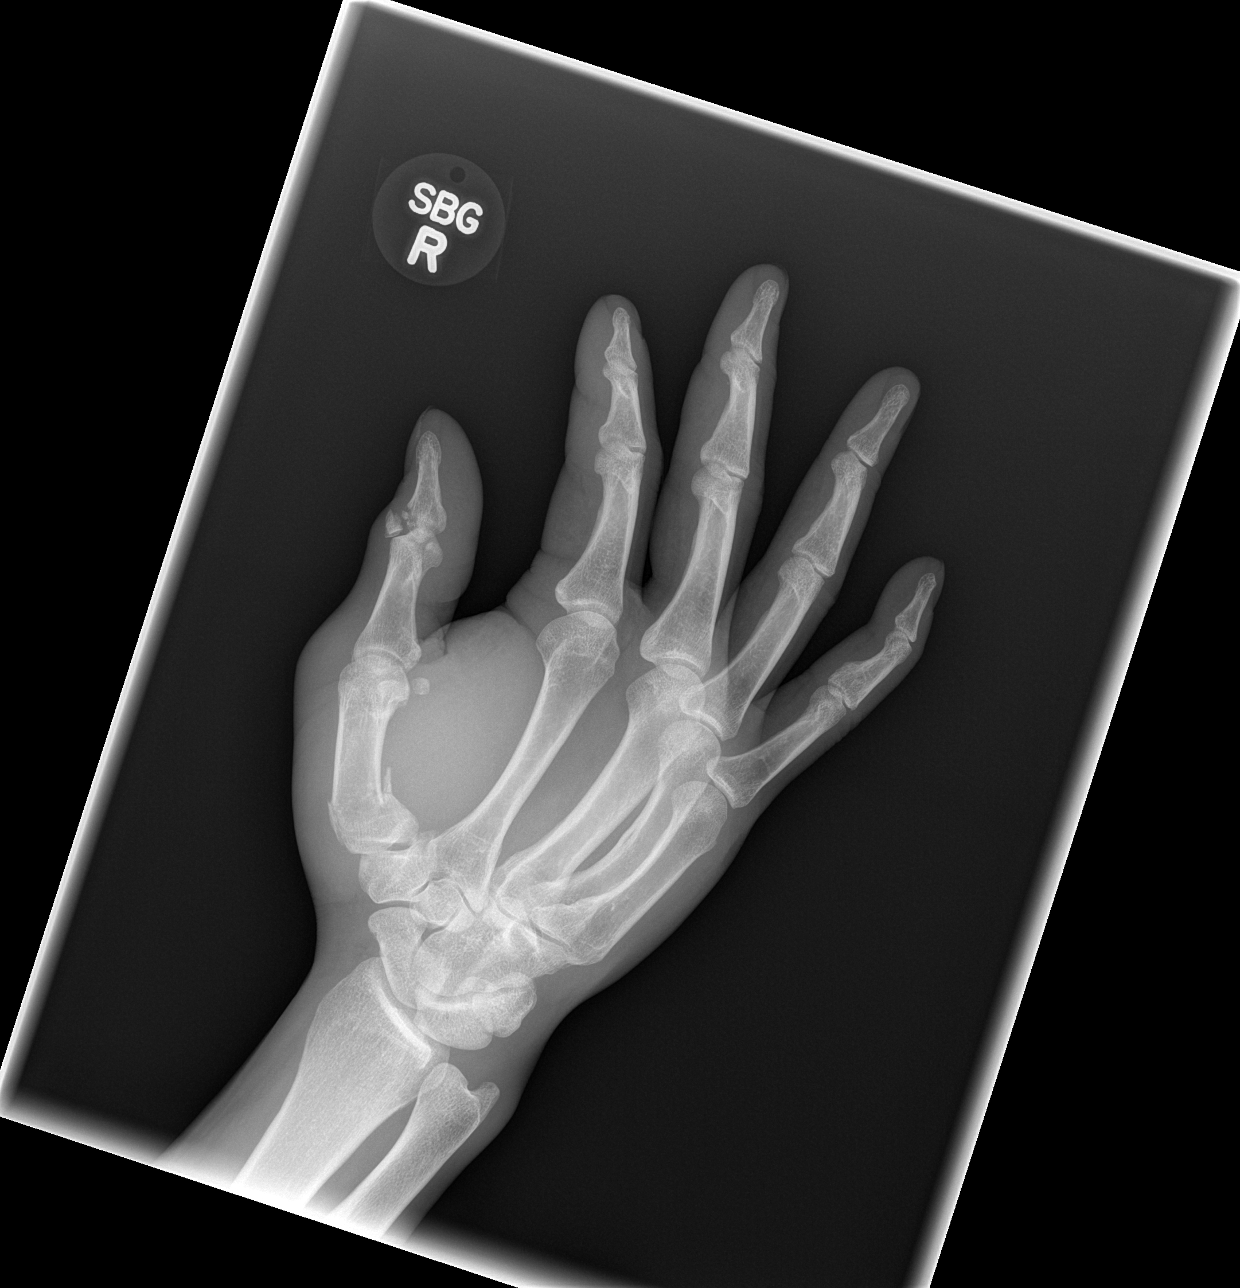

[x hand lat right]
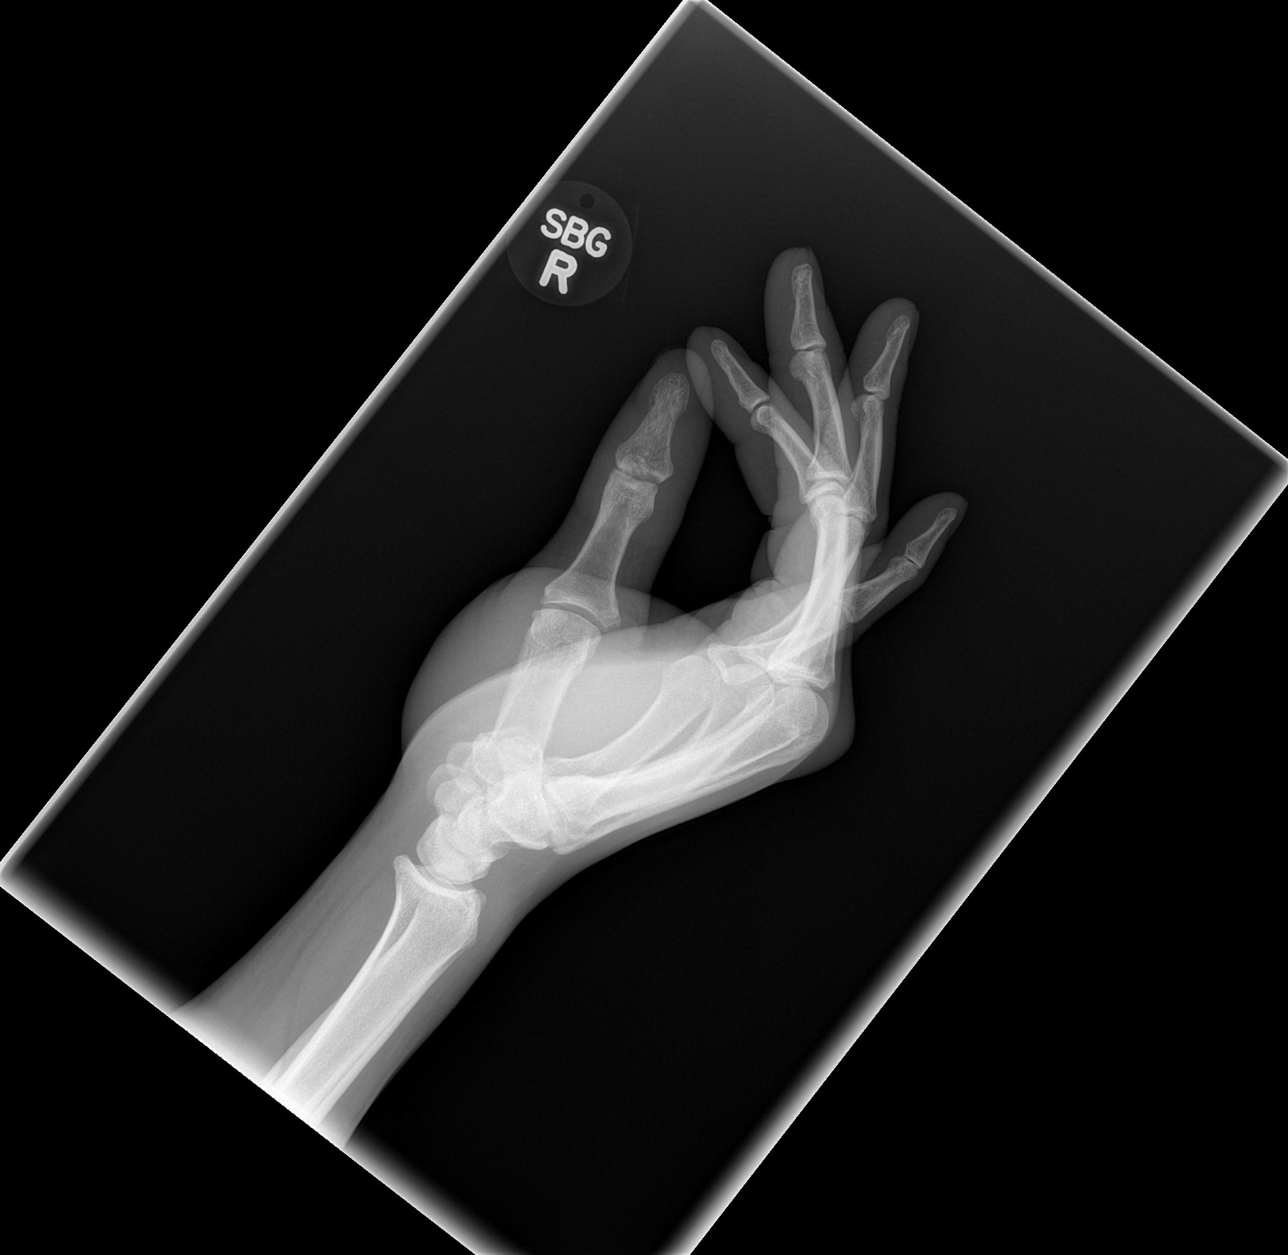

[3 of 3 positions shown; findings below may reference images not displayed]

FINDINGS: Comminuted fracture through the base of the distal phalanx of the
thumb with involvement are the articular surface. Additionally,
there is a comminuted fracture through the base of the first
metacarpal. There is associated soft tissue swelling about the thumb
interphalangeal joint and the thenar eminence. The remainder of the
visualized bones and joints are intact and unremarkable.
IMPRESSION: 1. Comminuted fracture through the base of the distal phalanx of the
thumb with involvement of the articular surface.
2. Comminuted fracture through the base of the first metacarpal with
apex dorsal angulation of the fracture site.
3. Associated surrounding soft tissue swelling.

## 2016-01-01 MED FILL — **BREO ELLIPTA 100-25 MCG I: 100-25MCG | 30 days supply | Qty: 60 | Fill #3

## 2016-01-01 MED FILL — VENTOLIN HFA 90 MCG INHALER: 108 (90 BAS | 30 days supply | Qty: 18 | Fill #0

## 2016-01-21 ENCOUNTER — Emergency Department (HOSPITAL_COMMUNITY): Payer: Self-pay

## 2016-01-21 ENCOUNTER — Encounter (HOSPITAL_COMMUNITY): Payer: Self-pay | Admitting: Nurse Practitioner

## 2016-01-21 ENCOUNTER — Inpatient Hospital Stay (HOSPITAL_COMMUNITY)
Admission: EM | Admit: 2016-01-21 | Discharge: 2016-01-23 | DRG: 190 | Disposition: A | Discharge status: Home / Self Care | Payer: Self-pay | Attending: Family Medicine | Admitting: Family Medicine

## 2016-01-21 ENCOUNTER — Ambulatory Visit: Payer: Self-pay | Admitting: Internal Medicine

## 2016-01-21 DIAGNOSIS — R000 Tachycardia, unspecified: Secondary | ICD-10-CM | POA: Diagnosis present

## 2016-01-21 DIAGNOSIS — J189 Pneumonia, unspecified organism: Secondary | ICD-10-CM | POA: Diagnosis present

## 2016-01-21 DIAGNOSIS — J181 Lobar pneumonia, unspecified organism: Secondary | ICD-10-CM

## 2016-01-21 DIAGNOSIS — R748 Abnormal levels of other serum enzymes: Secondary | ICD-10-CM | POA: Diagnosis present

## 2016-01-21 DIAGNOSIS — Z79899 Other long term (current) drug therapy: Secondary | ICD-10-CM

## 2016-01-21 DIAGNOSIS — Z91010 Allergy to peanuts: Secondary | ICD-10-CM

## 2016-01-21 DIAGNOSIS — Z91013 Allergy to seafood: Secondary | ICD-10-CM

## 2016-01-21 DIAGNOSIS — Z7951 Long term (current) use of inhaled steroids: Secondary | ICD-10-CM

## 2016-01-21 DIAGNOSIS — N183 Chronic kidney disease, stage 3 unspecified: Secondary | ICD-10-CM | POA: Diagnosis present

## 2016-01-21 DIAGNOSIS — L237 Allergic contact dermatitis due to plants, except food: Secondary | ICD-10-CM | POA: Diagnosis present

## 2016-01-21 DIAGNOSIS — Z91040 Latex allergy status: Secondary | ICD-10-CM

## 2016-01-21 DIAGNOSIS — K219 Gastro-esophageal reflux disease without esophagitis: Secondary | ICD-10-CM | POA: Diagnosis present

## 2016-01-21 DIAGNOSIS — Z91041 Radiographic dye allergy status: Secondary | ICD-10-CM

## 2016-01-21 DIAGNOSIS — J441 Chronic obstructive pulmonary disease with (acute) exacerbation: Secondary | ICD-10-CM | POA: Diagnosis present

## 2016-01-21 DIAGNOSIS — J440 Chronic obstructive pulmonary disease with acute lower respiratory infection: Principal | ICD-10-CM | POA: Diagnosis present

## 2016-01-21 DIAGNOSIS — F17210 Nicotine dependence, cigarettes, uncomplicated: Secondary | ICD-10-CM | POA: Diagnosis present

## 2016-01-21 DIAGNOSIS — R81 Glycosuria: Secondary | ICD-10-CM | POA: Diagnosis present

## 2016-01-21 DIAGNOSIS — Z880 Allergy status to penicillin: Secondary | ICD-10-CM

## 2016-01-21 DIAGNOSIS — E872 Acidosis, unspecified: Secondary | ICD-10-CM | POA: Diagnosis present

## 2016-01-21 DIAGNOSIS — J449 Chronic obstructive pulmonary disease, unspecified: Secondary | ICD-10-CM | POA: Diagnosis present

## 2016-01-21 LAB — COMPREHENSIVE METABOLIC PANEL
ALK PHOS: 91 U/L (ref 38–126)
ALT: 17 U/L (ref 17–63)
ANION GAP: 7 (ref 5–15)
AST: 32 U/L (ref 15–41)
Albumin: 3.3 g/dL — ABNORMAL LOW (ref 3.5–5.0)
BILIRUBIN TOTAL: 0.7 mg/dL (ref 0.3–1.2)
BUN: 13 mg/dL (ref 6–20)
CALCIUM: 9 mg/dL (ref 8.9–10.3)
CO2: 24 mmol/L (ref 22–32)
Chloride: 104 mmol/L (ref 101–111)
Creatinine, Ser: 1.6 mg/dL — ABNORMAL HIGH (ref 0.61–1.24)
GFR calc non Af Amer: 50 mL/min — ABNORMAL LOW (ref >60)
GFR, EST AFRICAN AMERICAN: 58 mL/min — AB (ref >60)
Glucose, Bld: 133 mg/dL — ABNORMAL HIGH (ref 65–99)
Potassium: 3.7 mmol/L (ref 3.5–5.1)
SODIUM: 135 mmol/L (ref 135–145)
TOTAL PROTEIN: 6 g/dL — AB (ref 6.5–8.1)

## 2016-01-21 LAB — URINALYSIS, ROUTINE W REFLEX MICROSCOPIC
BILIRUBIN URINE: NEGATIVE
Glucose, UA: 500 mg/dL — AB
Hgb urine dipstick: NEGATIVE
Ketones, ur: 15 mg/dL — AB
Leukocytes, UA: NEGATIVE
NITRITE: NEGATIVE
Protein, ur: NEGATIVE mg/dL
SPECIFIC GRAVITY, URINE: 1.023 (ref 1.005–1.030)
pH: 6.5 (ref 5.0–8.0)

## 2016-01-21 LAB — CBC WITH DIFFERENTIAL/PLATELET
BASOS PCT: 0 %
Basophils Absolute: 0 10*3/uL (ref 0.0–0.1)
EOS ABS: 0.4 10*3/uL (ref 0.0–0.7)
Eosinophils Relative: 3 %
HCT: 44.9 % (ref 39.0–52.0)
HEMOGLOBIN: 15.7 g/dL (ref 13.0–17.0)
Lymphocytes Relative: 9 %
Lymphs Abs: 1 10*3/uL (ref 0.7–4.0)
MCH: 33 pg (ref 26.0–34.0)
MCHC: 35 g/dL (ref 30.0–36.0)
MCV: 94.3 fL (ref 78.0–100.0)
Monocytes Absolute: 0.6 10*3/uL (ref 0.1–1.0)
Monocytes Relative: 5 %
NEUTROS PCT: 83 %
Neutro Abs: 9.4 10*3/uL — ABNORMAL HIGH (ref 1.7–7.7)
Platelets: 212 10*3/uL (ref 150–400)
RBC: 4.76 MIL/uL (ref 4.22–5.81)
RDW: 14.4 % (ref 11.5–15.5)
WBC: 11.4 10*3/uL — AB (ref 4.0–10.5)

## 2016-01-21 LAB — I-STAT CG4 LACTIC ACID, ED
LACTIC ACID, VENOUS: 1.05 mmol/L (ref 0.5–2.0)
LACTIC ACID, VENOUS: 3.09 mmol/L — AB (ref 0.5–2.0)

## 2016-01-21 LAB — TROPONIN I: TROPONIN I: 0.04 ng/mL — AB (ref <0.031)

## 2016-01-21 LAB — I-STAT TROPONIN, ED: TROPONIN I, POC: 0 ng/mL (ref 0.00–0.08)

## 2016-01-21 MED ORDER — ENOXAPARIN SODIUM 40 MG/0.4ML ~~LOC~~ SOLN
40.0000 mg | Freq: Every day | SUBCUTANEOUS | Status: DC
  Administered 2016-01-22: 40 mg via SUBCUTANEOUS
  Filled 2016-01-21: qty 0.4

## 2016-01-21 MED ORDER — LEVOFLOXACIN 500 MG PO TABS: 500.0000 mg | ORAL_TABLET | Freq: Every day | ORAL | Status: DC

## 2016-01-21 MED ORDER — IPRATROPIUM-ALBUTEROL 0.5-2.5 (3) MG/3ML IN SOLN: 3.0000 mL | Freq: Three times a day (TID) | RESPIRATORY_TRACT | Status: DC

## 2016-01-21 MED ORDER — GI COCKTAIL ~~LOC~~
30.0000 mL | Freq: Once | ORAL | Status: AC
  Administered 2016-01-21: 30 mL via ORAL

## 2016-01-21 MED ORDER — ACETAMINOPHEN 325 MG PO TABS
ORAL_TABLET | ORAL | Status: AC
  Filled 2016-01-21: qty 2

## 2016-01-21 MED ORDER — ALBUTEROL SULFATE (2.5 MG/3ML) 0.083% IN NEBU
2.5000 mg | INHALATION_SOLUTION | Freq: Four times a day (QID) | RESPIRATORY_TRACT | Status: DC | PRN
  Administered 2016-01-23: 2.5 mg via RESPIRATORY_TRACT
  Filled 2016-01-21: qty 3

## 2016-01-21 MED ORDER — GI COCKTAIL ~~LOC~~
30.0000 mL | Freq: Three times a day (TID) | ORAL | Status: DC | PRN
  Administered 2016-01-21 – 2016-01-23 (×4): 30 mL via ORAL
  Filled 2016-01-21 (×5): qty 30

## 2016-01-21 MED ORDER — ADULT MULTIVITAMIN W/MINERALS CH
1.0000 | ORAL_TABLET | Freq: Every day | ORAL | Status: DC
  Administered 2016-01-22 – 2016-01-23 (×2): 1 via ORAL
  Filled 2016-01-21 (×2): qty 1

## 2016-01-21 MED ORDER — IPRATROPIUM-ALBUTEROL 0.5-2.5 (3) MG/3ML IN SOLN
3.0000 mL | Freq: Four times a day (QID) | RESPIRATORY_TRACT | Status: DC | PRN
  Administered 2016-01-21: 3 mL via RESPIRATORY_TRACT
  Filled 2016-01-21: qty 3

## 2016-01-21 MED ORDER — ALBUTEROL SULFATE HFA 108 (90 BASE) MCG/ACT IN AERS: 1.0000 | INHALATION_SPRAY | RESPIRATORY_TRACT | Status: DC | PRN

## 2016-01-21 MED ORDER — DEXTROSE-NACL 5-0.45 % IV SOLN
INTRAVENOUS | Status: AC
  Administered 2016-01-21 – 2016-01-22 (×2): via INTRAVENOUS

## 2016-01-21 MED ORDER — SODIUM CHLORIDE 0.9 % IV SOLN
INTRAVENOUS | Status: DC
  Administered 2016-01-22: 10:00:00 via INTRAVENOUS

## 2016-01-21 MED ORDER — NAPHAZOLINE HCL 0.1 % OP SOLN
1.0000 [drp] | Freq: Four times a day (QID) | OPHTHALMIC | Status: DC | PRN
  Filled 2016-01-21: qty 15

## 2016-01-21 MED ORDER — SODIUM CHLORIDE 0.9 % IV BOLUS (SEPSIS)
1000.0000 mL | Freq: Once | INTRAVENOUS | Status: AC
  Administered 2016-01-21: 1000 mL via INTRAVENOUS

## 2016-01-21 MED ORDER — SODIUM CHLORIDE 0.9 % IV BOLUS (SEPSIS): 250.0000 mL | Freq: Once | INTRAVENOUS | Status: DC

## 2016-01-21 MED ORDER — FLUTICASONE FUROATE-VILANTEROL 100-25 MCG/INH IN AEPB
1.0000 | INHALATION_SPRAY | Freq: Every morning | RESPIRATORY_TRACT | Status: DC
  Administered 2016-01-22 – 2016-01-23 (×2): 1 via RESPIRATORY_TRACT
  Filled 2016-01-21: qty 28

## 2016-01-21 MED ORDER — LEVOFLOXACIN IN D5W 500 MG/100ML IV SOLN
500.0000 mg | Freq: Once | INTRAVENOUS | Status: AC
  Administered 2016-01-21: 500 mg via INTRAVENOUS
  Filled 2016-01-21: qty 100

## 2016-01-21 MED ORDER — PANTOPRAZOLE SODIUM 40 MG PO TBEC
40.0000 mg | DELAYED_RELEASE_TABLET | Freq: Every day | ORAL | Status: DC
  Administered 2016-01-21 – 2016-01-23 (×3): 40 mg via ORAL
  Filled 2016-01-21 (×3): qty 1

## 2016-01-21 MED ORDER — LEVOFLOXACIN IN D5W 750 MG/150ML IV SOLN
750.0000 mg | INTRAVENOUS | Status: DC
  Administered 2016-01-22: 750 mg via INTRAVENOUS
  Filled 2016-01-21: qty 150

## 2016-01-21 MED ORDER — ACETAMINOPHEN 325 MG PO TABS
650.0000 mg | ORAL_TABLET | Freq: Once | ORAL | Status: AC | PRN
  Administered 2016-01-21: 650 mg via ORAL

## 2016-01-21 NOTE — ED Notes (Signed)
Pt c/o 3 day history of sweats, chills, body aches, chest pain, headaches. He reports n/v, cough, fatigue He denies bowel/bladder changes. He has a rash which he said is from being in poison ivy 3 days ago, he has been applying calamine lotion with some relief. He is alert, diaphoretic

## 2016-01-21 NOTE — ED Notes (Signed)
Admitting MD at bedside.

## 2016-01-21 NOTE — ED Notes (Signed)
[facility]

## 2016-01-21 NOTE — H&P (Signed)
History and Physical    Keith Weber ZOX:[id number] DOB: 1971-02-26 DOA: 01/21/2016   PCP: Ambrose Finland, NP Chief Complaint:  Chief Complaint  Patient presents with  . Fever    HPI: Keith Weber is a 45 y.o. male with medical history significant of Asthma, COPD / emphyzema, smoking history and still smokes 1-2 cigars a day.  Patient presents to ED with cough, fever, congestion.  Symptoms onset 3 days ago, has been ongoing, worse when lying down.  Body aches and chills.  No vomiting nor diarrhea.  ED Course: Found to have RML PNA on CXR, started on levaquin.  Lactate elevated from 1 to 3 during ED stay so EDP wants him admitted for obs.  Review of Systems: As per HPI otherwise 10 point review of systems negative.    Past Medical History  Diagnosis Date  . Asthma   . Sciatic nerve pain   . COPD (chronic obstructive pulmonary disease) (HCC)   . GERD (gastroesophageal reflux disease)     Past Surgical History  Procedure Laterality Date  . I&d extremity Right 06/16/2014    Procedure: IRRIGATION AND DEBRIDEMENT RIGHT THUMB AND FIRST FINGER AND PINNING ;  Surgeon: Dominica Severin, MD;  Location: [facility] OR;  Service: Orthopedics;  Laterality: Right;  . Irrigation and debridement knee Right 06/16/2014    Procedure: IRRIGATION AND DEBRIDEMENT POSTERIOR KNEE;  Surgeon: Dominica Severin, MD;  Location: [facility] OR;  Service: Orthopedics;  Laterality: Right;  . Laceration repair Right 06/16/2014    Procedure: REPAIR ODF THREE LACERATIONS POSTERIOR KNEE;  Surgeon: Dominica Severin, MD;  Location: [facility] OR;  Service: Orthopedics;  Laterality: Right;     reports that he has been smoking Cigarettes.  He has a 15.5 pack-year smoking history. He does not have any smokeless tobacco history on file. He reports that he drinks alcohol. He reports that he uses illicit drugs (Marijuana).  Allergies  Allergen Reactions  . Peanut-Containing Drug Products Anaphylaxis  . Shellfish Allergy Anaphylaxis and Shortness Of  Breath  . Amoxicillin Hives    Has patient had a PCN reaction causing immediate rash, facial/tongue/throat swelling, SOB or lightheadedness with hypotension:  Has patient had a PCN reaction causing severe rash involving mucus membranes or skin necrosis:  Has patient had a PCN reaction that required hospitalization Yes Has patient had a PCN reaction occurring within the last 10 years: If all of the above answers are "NO", then may proceed with Cephalosporin use.   . Iodine Nausea And Vomiting and Swelling  . Latex Itching and Rash    Family History  Problem Relation Age of Onset  . Family history unknown: Yes     Prior to Admission medications   Medication Sig Start Date End Date Taking? Authorizing Provider  albuterol (PROVENTIL) (5 MG/ML) 0.5% nebulizer solution Take 0.5 mLs (2.5 mg total) by nebulization every 6 (six) hours as needed for wheezing or shortness of breath. 11/16/15  Yes Tiffany Neva Seat, PA-C  Fluticasone Furoate-Vilanterol (BREO ELLIPTA) 100-25 MCG/INH AEPB Inhale 1 puff into the lungs every morning. 07/31/15  Yes Nyoka Cowden, MD  ipratropium-albuterol (DUONEB) 0.5-2.5 (3) MG/3ML SOLN Take 3 mLs by nebulization every 6 (six) hours as needed. Patient taking differently: Take 3 mLs by nebulization every 6 (six) hours as needed (for wheezing or shortness of breath).  11/16/15  Yes Marlon Pel, PA-C  Multiple Vitamin (MULTIVITAMIN WITH MINERALS) TABS tablet Take 1 tablet by mouth daily.   Yes Historical Provider, MD  tetrahydrozoline 0.05 % ophthalmic solution  Place 1 drop into both eyes 2 (two) times daily as needed (itching).   Yes Historical Provider, MD  VENTOLIN HFA 108 (90 Base) MCG/ACT inhaler INHALE 2 PUFFS INTO THE LUNGS EVERY 6 HOURS AS NEEDED FOR WHEEZING OR SHORTNESS OF BREATH. 12/12/15  Yes Quentin Angstlugbemiga E Jegede, MD  levofloxacin (LEVAQUIN) 500 MG tablet Take 1 tablet (500 mg total) by mouth daily. 01/21/16   Linwood DibblesJon Knapp, MD    Physical Exam: Filed Vitals:    01/21/16 1927 01/21/16 1945 01/21/16 2015 01/21/16 2045  BP: 110/71 125/70 138/83 140/82  Pulse: 91 87 91 88  Temp: 98.8 F (37.1 C)     TempSrc: Oral     Resp: 18     Weight:      SpO2: 97% 95% 97% 98%      Constitutional: NAD, calm, comfortable Eyes: PERRL, lids and conjunctivae normal ENMT: Mucous membranes are moist. Posterior pharynx clear of any exudate or lesions.Normal dentition.  Neck: normal, supple, no masses, no thyromegaly Respiratory: Decreased breath sounds over R lung. Cardiovascular: Regular rate and rhythm, no murmurs / rubs / gallops. No extremity edema. 2+ pedal pulses. No carotid bruits.  Abdomen: no tenderness, no masses palpated. No hepatosplenomegaly. Bowel sounds positive.  Musculoskeletal: no clubbing / cyanosis. No joint deformity upper and lower extremities. Good ROM, no contractures. Normal muscle tone.  Skin: no rashes, lesions, ulcers. No induration Neurologic: CN 2-12 grossly intact. Sensation intact, DTR normal. Strength 5/5 in all 4.  Psychiatric: Normal judgment and insight. Alert and oriented x 3. Normal mood.   Labs on Admission: I have personally reviewed following labs and imaging studies  CBC:  Recent Labs Lab 01/21/16 1651  WBC 11.4*  NEUTROABS 9.4*  HGB 15.7  HCT 44.9  MCV 94.3  PLT 212   Basic Metabolic Panel:  Recent Labs Lab 01/21/16 1651  NA 135  K 3.7  CL 104  CO2 24  GLUCOSE 133*  BUN 13  CREATININE 1.60*  CALCIUM 9.0   GFR: Estimated Creatinine Clearance: 59.2 mL/min (by C-G formula based on Cr of 1.6). Liver Function Tests:  Recent Labs Lab 01/21/16 1651  AST 32  ALT 17  ALKPHOS 91  BILITOT 0.7  PROT 6.0*  ALBUMIN 3.3*   No results for input(s): LIPASE, AMYLASE in the last 168 hours. No results for input(s): AMMONIA in the last 168 hours. Coagulation Profile: No results for input(s): INR, PROTIME in the last 168 hours. Cardiac Enzymes: No results for input(s): CKTOTAL, CKMB, CKMBINDEX, TROPONINI  in the last 168 hours. BNP (last 3 results) No results for input(s): PROBNP in the last 8760 hours. HbA1C: No results for input(s): HGBA1C in the last 72 hours. CBG: No results for input(s): GLUCAP in the last 168 hours. Lipid Profile: No results for input(s): CHOL, HDL, LDLCALC, TRIG, CHOLHDL, LDLDIRECT in the last 72 hours. Thyroid Function Tests: No results for input(s): TSH, T4TOTAL, FREET4, T3FREE, THYROIDAB in the last 72 hours. Anemia Panel: No results for input(s): VITAMINB12, FOLATE, FERRITIN, TIBC, IRON, RETICCTPCT in the last 72 hours. Urine analysis:    Component Value Date/Time   COLORURINE YELLOW 01/21/2016 1831   APPEARANCEUR CLEAR 01/21/2016 1831   LABSPEC 1.023 01/21/2016 1831   PHURINE 6.5 01/21/2016 1831   GLUCOSEU 500* 01/21/2016 1831   HGBUR NEGATIVE 01/21/2016 1831   BILIRUBINUR NEGATIVE 01/21/2016 1831   KETONESUR 15* 01/21/2016 1831   PROTEINUR NEGATIVE 01/21/2016 1831   NITRITE NEGATIVE 01/21/2016 1831   LEUKOCYTESUR NEGATIVE 01/21/2016 1831   Sepsis  Labs: @LABRCNTIP (procalcitonin:4,lacticidven:4) )No results found for this or any previous visit (from the past 240 hour(s)).   Radiological Exams on Admission: Dg Chest 2 View  01/21/2016  CLINICAL DATA:  Mid chest and back pain for 3 days. Shortness of breath. Smoker. EXAM: CHEST  2 VIEW COMPARISON:  PA and lateral chest 11/20/2015. FINDINGS: Airspace disease is seen in the right middle lobe and lingula most consistent with pneumonia. The lungs appear emphysematous. Heart size is normal. IMPRESSION: Right middle lobe and lingular airspace disease consistent with pneumonia. Recommend followup to clearing. Emphysema. Electronically Signed   By: Drusilla Kanner M.D.   On: 01/21/2016 17:15    EKG: Independently reviewed.  Assessment/Plan Principal Problem:   CAP (community acquired pneumonia) Active Problems:   COPD pfts pending/ still smoking    Right middle lobe pneumonia   Glucosuria with normal  serum glucose   Lactic acidosis   CKD (chronic kidney disease) stage 3, GFR 30-59 ml/min   CAP with RML PNA -  Levaquin due to PCN allergy  PNA pathway  Lactic acidosis - with lactate of 3 in ED  IVF  Repeat lactate in 3 hrs  COPD / asthma - continue home nebs, checking A1AT  Heart burn - protonix, gi cocktail, checking troponin just in case  Glucosuria with normal serum glucose and no history of DM - checking A1C  CKD stage 3 - chronic and appears baseline    DVT prophylaxis: Lovenox Code Status: Full Family Communication: No family in room Consults called: None Admission status: Admit to obs   GARDNER, Heywood Iles DO [facility] Pager [phone] from 7PM-7AM  If 7AM-7PM, please contact the day physician for the patient [email or web address] Password [email or web address]  01/21/2016, 9:10 PM

## 2016-01-21 NOTE — Discharge Instructions (Signed)

## 2016-01-21 NOTE — ED Provider Notes (Addendum)
CSN: [id number]     Arrival date & time 01/21/16  1614 History   First MD Initiated Contact with Patient 01/21/16 1821     Chief Complaint  Patient presents with  . Fever   HPI He started having  Fevers 3 days ago.  He has been coughing a lot and has had burning in his chest. He feels congested in his chest.  He is coughing more at night and it feels worse lying on his back.  He has had bodyaches and chills.  No dysuria.  No vomiting or diarrhea.  He noticed a rash that he attributed to poison ivy but that has been getting better.  No dysuria. Past Medical History  Diagnosis Date  . Asthma   . Sciatic nerve pain   . COPD (chronic obstructive pulmonary disease) (HCC)   . GERD (gastroesophageal reflux disease)    Past Surgical History  Procedure Laterality Date  . I&d extremity Right 06/16/2014    Procedure: IRRIGATION AND DEBRIDEMENT RIGHT THUMB AND FIRST FINGER AND PINNING ;  Surgeon: Dominica Severin, MD;  Location: [facility] OR;  Service: Orthopedics;  Laterality: Right;  . Irrigation and debridement knee Right 06/16/2014    Procedure: IRRIGATION AND DEBRIDEMENT POSTERIOR KNEE;  Surgeon: Dominica Severin, MD;  Location: [facility] OR;  Service: Orthopedics;  Laterality: Right;  . Laceration repair Right 06/16/2014    Procedure: REPAIR ODF THREE LACERATIONS POSTERIOR KNEE;  Surgeon: Dominica Severin, MD;  Location: [facility] OR;  Service: Orthopedics;  Laterality: Right;   Family History  Problem Relation Age of Onset  . Family history unknown: Yes   Social History  Substance Use Topics  . Smoking status: Current Some Day Smoker -- 0.50 packs/day for 31 years    Types: Cigarettes  . Smokeless tobacco: None  . Alcohol Use: 0.0 oz/week    0 Standard drinks or equivalent per week     Comment: occ    Review of Systems  All other systems reviewed and are negative.     Allergies  Peanut-containing drug products; Shellfish allergy; Amoxicillin; Iodine; and Latex  Home Medications   Prior to Admission  medications   Medication Sig Start Date End Date Taking? Authorizing Provider  albuterol (PROVENTIL) (5 MG/ML) 0.5% nebulizer solution Take 0.5 mLs (2.5 mg total) by nebulization every 6 (six) hours as needed for wheezing or shortness of breath. 11/16/15  Yes Tiffany Neva Seat, PA-C  Fluticasone Furoate-Vilanterol (BREO ELLIPTA) 100-25 MCG/INH AEPB Inhale 1 puff into the lungs every morning. 07/31/15  Yes Nyoka Cowden, MD  ipratropium-albuterol (DUONEB) 0.5-2.5 (3) MG/3ML SOLN Take 3 mLs by nebulization every 6 (six) hours as needed. Patient taking differently: Take 3 mLs by nebulization every 6 (six) hours as needed (for wheezing or shortness of breath).  11/16/15  Yes Marlon Pel, PA-C  Multiple Vitamin (MULTIVITAMIN WITH MINERALS) TABS tablet Take 1 tablet by mouth daily.   Yes Historical Provider, MD  tetrahydrozoline 0.05 % ophthalmic solution Place 1 drop into both eyes 2 (two) times daily as needed (itching).   Yes Historical Provider, MD  VENTOLIN HFA 108 (90 Base) MCG/ACT inhaler INHALE 2 PUFFS INTO THE LUNGS EVERY 6 HOURS AS NEEDED FOR WHEEZING OR SHORTNESS OF BREATH. 12/12/15  Yes Quentin Angst, MD  levofloxacin (LEVAQUIN) 500 MG tablet Take 1 tablet (500 mg total) by mouth daily. 01/21/16   Linwood Dibbles, MD   BP 140/87 mmHg  Pulse 99  Temp(Src) 99.7 F (37.6 C) (Oral)  Resp 18  Wt 72.576 kg  SpO2 96% Physical Exam  Constitutional: He appears well-developed and well-nourished. No distress.  HENT:  Head: Normocephalic and atraumatic.  Right Ear: External ear normal.  Left Ear: External ear normal.  Eyes: Conjunctivae are normal. Right eye exhibits no discharge. Left eye exhibits no discharge. No scleral icterus.  Neck: Neck supple. No tracheal deviation present.  Cardiovascular: Normal rate, regular rhythm and intact distal pulses.   Pulmonary/Chest: Effort normal. No stridor. No respiratory distress. He has no wheezes. He has rales.  Abdominal: Soft. Bowel sounds are normal. He  exhibits no distension. There is no tenderness. There is no rebound and no guarding.  Musculoskeletal: He exhibits no edema or tenderness.  Neurological: He is alert. He has normal strength. No cranial nerve deficit (no facial droop, extraocular movements intact, no slurred speech) or sensory deficit. He exhibits normal muscle tone. He displays no seizure activity. Coordination normal.  Skin: Skin is warm and dry. No rash noted.  Psychiatric: He has a normal mood and affect.  Nursing note and vitals reviewed.   ED Course  Procedures (including critical care time) Labs Review Labs Reviewed  COMPREHENSIVE METABOLIC PANEL - Abnormal; Notable for the following:    Glucose, Bld 133 (*)    Creatinine, Ser 1.60 (*)    Total Protein 6.0 (*)    Albumin 3.3 (*)    GFR calc non Af Amer 50 (*)    GFR calc Af Amer 58 (*)    All other components within normal limits  CBC WITH DIFFERENTIAL/PLATELET - Abnormal; Notable for the following:    WBC 11.4 (*)    Neutro Abs 9.4 (*)    All other components within normal limits  URINALYSIS, ROUTINE W REFLEX MICROSCOPIC (NOT AT [facility]) - Abnormal; Notable for the following:    Glucose, UA 500 (*)    Ketones, ur 15 (*)    All other components within normal limits  URINE CULTURE  I-STAT CG4 LACTIC ACID, ED  Rosezena Sensor, ED    Imaging Review Dg Chest 2 View  01/21/2016  CLINICAL DATA:  Mid chest and back pain for 3 days. Shortness of breath. Smoker. EXAM: CHEST  2 VIEW COMPARISON:  PA and lateral chest 11/20/2015. FINDINGS: Airspace disease is seen in the right middle lobe and lingula most consistent with pneumonia. The lungs appear emphysematous. Heart size is normal. IMPRESSION: Right middle lobe and lingular airspace disease consistent with pneumonia. Recommend followup to clearing. Emphysema. Electronically Signed   By: Drusilla Kanner M.D.   On: 01/21/2016 17:15   I have personally reviewed and evaluated these images and lab results as part of my  medical decision-making.   EKG Interpretation   Date/Time:  Tuesday January 21 2016 16:20:50 EDT Ventricular Rate:  121 PR Interval:  126 QRS Duration: 78 QT Interval:  284 QTC Calculation: 403 R Axis:   96 Text Interpretation:  Sinus tachycardia Right atrial enlargement Rightward  axis Borderline ECG Since last tracing rate faster Confirmed by Marisel Tostenson   MD-J, Maja Mccaffery ([id number]) on 01/21/2016 6:29:10 PM      MDM   Final diagnoses:  CAP (community acquired pneumonia)    Labs are reassuring.  CXR shows pneumonia.  Pt is breathing easily in the ED.  Non toxic.  Will prescribe levaquin with his amoxicillin allergy.  Follow up with a primary care doctor to make sure he is improving and explained the need to make sure his PNA clears.  Linwood Dibbles, MD 01/21/16 1906  Notified that patient had a  repeat lactic acid level done per protocol.  Repeat lactic acid level is 3.   BP remains stable.  Will give fluid bolus but with the increasing lactic acid level and pna I will consult with medical service for overnight observation.   Linwood DibblesJon Kanyon Bunn, MD 01/21/16 2010

## 2016-01-22 ENCOUNTER — Other Ambulatory Visit: Payer: Self-pay | Admitting: *Deleted

## 2016-01-22 DIAGNOSIS — N183 Chronic kidney disease, stage 3 (moderate): Secondary | ICD-10-CM

## 2016-01-22 DIAGNOSIS — J449 Chronic obstructive pulmonary disease, unspecified: Secondary | ICD-10-CM

## 2016-01-22 DIAGNOSIS — R81 Glycosuria: Secondary | ICD-10-CM

## 2016-01-22 DIAGNOSIS — E872 Acidosis: Secondary | ICD-10-CM

## 2016-01-22 DIAGNOSIS — J189 Pneumonia, unspecified organism: Secondary | ICD-10-CM

## 2016-01-22 LAB — TROPONIN I
Troponin I: <0.03 ng/mL (ref <0.031)
Troponin I: <0.03 ng/mL (ref <0.031)

## 2016-01-22 LAB — LACTIC ACID, PLASMA: Lactic Acid, Venous: 1.5 mmol/L (ref 0.5–2.0)

## 2016-01-22 LAB — HIV ANTIBODY (ROUTINE TESTING W REFLEX): HIV Screen 4th Generation wRfx: NONREACTIVE

## 2016-01-22 MED ORDER — FLUTICASONE FUROATE-VILANTEROL 100-25 MCG/INH IN AEPB: 1.0000 | INHALATION_SPRAY | Freq: Every morning | RESPIRATORY_TRACT | Status: DC

## 2016-01-22 MED ORDER — IPRATROPIUM-ALBUTEROL 0.5-2.5 (3) MG/3ML IN SOLN
3.0000 mL | Freq: Four times a day (QID) | RESPIRATORY_TRACT | Status: DC
  Administered 2016-01-22 – 2016-01-23 (×5): 3 mL via RESPIRATORY_TRACT
  Filled 2016-01-22 (×4): qty 3

## 2016-01-22 MED ORDER — HYDROXYZINE HCL 25 MG PO TABS
25.0000 mg | ORAL_TABLET | Freq: Three times a day (TID) | ORAL | Status: DC | PRN
  Administered 2016-01-22: 25 mg via ORAL
  Filled 2016-01-22: qty 1

## 2016-01-22 MED ORDER — ALBUTEROL SULFATE HFA 108 (90 BASE) MCG/ACT IN AERS: INHALATION_SPRAY | RESPIRATORY_TRACT | Status: DC

## 2016-01-22 MED ORDER — TRAMADOL HCL 50 MG PO TABS
50.0000 mg | ORAL_TABLET | Freq: Four times a day (QID) | ORAL | Status: DC | PRN
  Administered 2016-01-22: 50 mg via ORAL
  Filled 2016-01-22: qty 1

## 2016-01-22 MED ORDER — FAMOTIDINE 20 MG PO TABS
20.0000 mg | ORAL_TABLET | Freq: Two times a day (BID) | ORAL | Status: DC | PRN
  Administered 2016-01-22 (×2): 20 mg via ORAL
  Filled 2016-01-22 (×2): qty 1

## 2016-01-22 MED ORDER — GUAIFENESIN ER 600 MG PO TB12
600.0000 mg | ORAL_TABLET | Freq: Two times a day (BID) | ORAL | Status: DC
  Administered 2016-01-22 – 2016-01-23 (×3): 600 mg via ORAL
  Filled 2016-01-22 (×3): qty 1

## 2016-01-22 MED ORDER — METHYLPREDNISOLONE SODIUM SUCC 125 MG IJ SOLR
60.0000 mg | Freq: Four times a day (QID) | INTRAMUSCULAR | Status: DC
  Administered 2016-01-22 – 2016-01-23 (×5): 60 mg via INTRAVENOUS
  Filled 2016-01-22 (×5): qty 2

## 2016-01-22 MED ORDER — ACETAMINOPHEN 325 MG PO TABS
650.0000 mg | ORAL_TABLET | Freq: Four times a day (QID) | ORAL | Status: DC | PRN
  Administered 2016-01-22: 650 mg via ORAL
  Filled 2016-01-22: qty 2

## 2016-01-22 NOTE — Care Management Note (Signed)
Case Management Note  Patient Details  Name: Keith Weber MRN: [id number] Date of Birth: [birth year]  Subjective/Objective:       45 yo admitted with CAP.             Action/Plan: From home with family.  Chart reviewed and CM will follow along for discharge needs.  Expected Discharge Date:  01/22/16               Expected Discharge Plan:  Home/Self Care  In-House Referral:     Discharge planning Services  CM Consult  Post Acute Care Choice:    Choice offered to:     DME Arranged:    DME Agency:     HH Arranged:    HH Agency:     Status of Service:  In process, will continue to follow  Medicare Important Message Given:    Date Medicare IM Given:    Medicare IM give by:    Date Additional Medicare IM Given:    Additional Medicare Important Message give by:     If discussed at Long Length of Stay Meetings, dates discussed:    Additional CommentsBartholome Bill:  Mekiah Wahler H, RN 01/22/2016, 1:59 PM  [phone]

## 2016-01-22 NOTE — Progress Notes (Signed)
Pt was complaining of indigestion on admission tonight. [facility] also was [facility]. [facility] stated that GI cocktail was given. Another GI cocktail was given shortly after arrival here, and protonix was given as ordered.  Pt continues to complain of indigestion. He states the he does have GERD.  Paged mid level Craige CottaKirby to see if there is anything we can give to relieve pt's discomfort. Awaiting orders at this time.

## 2016-01-22 NOTE — Telephone Encounter (Signed)
MAPS PROGRAM 

## 2016-01-22 NOTE — Progress Notes (Signed)
[facility]  PROGRESS NOTE  Delynn Flavinnthony Heintzelman ZOX:[id number] DOB: 1971-03-04 DOA: 01/21/2016 PCP: Ambrose FinlandValerie A Keck, NP    Brief HPI:  45 y.o. male with medical history significant of Asthma, COPD / emphyzema, smoking history and still smokes 1-2 cigars a day. Patient presents to ED with cough, fever, congestion. Symptoms onset 3 days ago, has been ongoing, worse when lying down. Body aches and chills. No vomiting nor diarrhea.  ED Course: Found to have RML PNA on CXR, started on levaquin. Lactate elevated from 1 to 3 during ED stay so EDP wants him admitted for obs.  Principal Problem:   CAP (community acquired pneumonia) Active Problems:   COPD pfts pending/ still smoking    Right middle lobe pneumonia   Glucosuria with normal serum glucose   Lactic acidosis   CKD (chronic kidney disease) stage 3, GFR 30-59 ml/min   Assessment/Plan: 1. Community-acquired pneumonia- patient started on Levaquin, strep pneumo urinary antigen pending. Blood cultures 2 have been obtained, results are pending. Lactic acid is now 1.5 2. COPD exacerbation- continue with DuoNeb every 6 hours, will also start Solu-Medrol 60 mg IV every 6 hours. Mucinex 1 tablet by mouth twice a day. 3. Glucosuria- UA showed glucose. Hemoglobin A1c has been ordered. We will follow the results. Glucose is well controlled in the hospital. 4. Elevated troponin- mild elevation of troponin, 0.04. Will obtain serial troponin. EKG showed sinus tachycardia, which is now resolved. 5. CKD stage III- creatinine at baseline. Today creatinine is 1.6   DVT prophylaxis: Lovenox Code Status: Full code Family Communication: No family at bedside Disposition Plan: Home likely in next 2 days   Consultants:  None  Procedures:  None  Antibiotics:  Levaquin  Subjective: Patient seen and examined this morning, continues to have wheezing. No fever or chills. Continues to cough up yellow colored phlegm.  Objective: Filed  Vitals:   01/21/16 2324 01/22/16 0520 01/22/16 0828 01/22/16 0831  BP:  155/94    Pulse:  77    Temp:  98.7 F (37.1 C)    TempSrc:  Oral    Resp:  16    Weight:      SpO2: 98% 95% 98% 98%    Intake/Output Summary (Last 24 hours) at 01/22/16 1421 Last data filed at 01/22/16 0806  Gross per 24 hour  Intake    240 ml  Output   1625 ml  Net  -1385 ml   Filed Weights   01/21/16 1824  Weight: 72.576 kg (160 lb)    Examination:  General exam: Appears calm and comfortable  Respiratory system: Bilateral wheezing on auscultation Cardiovascular system: S1 & S2 heard, RRR. No JVD, murmurs, rubs, gallops or clicks. No pedal edema. Gastrointestinal system: Abdomen is nondistended, soft and nontender. No organomegaly or masses felt. Normal bowel sounds heard. Central nervous system: Alert and oriented. No focal neurological deficits. Extremities: Symmetric 5 x 5 power. Skin: No rashes, lesions or ulcers Psychiatry: Judgement and insight appear normal. Mood & affect appropriate.    Data Reviewed: I have personally reviewed following labs and imaging studies Basic Metabolic Panel:  Recent Labs Lab 01/21/16 1651  NA 135  K 3.7  CL 104  CO2 24  GLUCOSE 133*  BUN 13  CREATININE 1.60*  CALCIUM 9.0   Liver Function Tests:  Recent Labs Lab 01/21/16 1651  AST 32  ALT 17  ALKPHOS 91  BILITOT 0.7  PROT 6.0*  ALBUMIN 3.3*   No results for input(s): LIPASE, AMYLASE in  the last 168 hours. No results for input(s): AMMONIA in the last 168 hours. CBC:  Recent Labs Lab 01/21/16 1651  WBC 11.4*  NEUTROABS 9.4*  HGB 15.7  HCT 44.9  MCV 94.3  PLT 212   Cardiac Enzymes:  Recent Labs Lab 01/21/16 2116  TROPONINI 0.04*     Studies: Dg Chest 2 View  01/21/2016  CLINICAL DATA:  Mid chest and back pain for 3 days. Shortness of breath. Smoker. EXAM: CHEST  2 VIEW COMPARISON:  PA and lateral chest 11/20/2015. FINDINGS: Airspace disease is seen in the right middle lobe and  lingula most consistent with pneumonia. The lungs appear emphysematous. Heart size is normal. IMPRESSION: Right middle lobe and lingular airspace disease consistent with pneumonia. Recommend followup to clearing. Emphysema. Electronically Signed   By: Drusilla Kanner M.D.   On: 01/21/2016 17:15    Scheduled Meds: . [COMPLETED] dextrose 5 % and 0.45% NaCl   Intravenous STAT  . enoxaparin (LOVENOX) injection  40 mg Subcutaneous QHS  . fluticasone furoate-vilanterol  1 puff Inhalation q morning - 10a  . ipratropium-albuterol  3 mL Nebulization Q6H  . levofloxacin (LEVAQUIN) IV  750 mg Intravenous Q24H  . methylPREDNISolone (SOLU-MEDROL) injection  60 mg Intravenous Q6H  . multivitamin with minerals  1 tablet Oral Daily  . pantoprazole  40 mg Oral Daily  . sodium chloride  250 mL Intravenous Once   Continuous Infusions: . sodium chloride 10 mL/hr at 01/22/16 0945       Time spent: 25 min    [facility]  [facility] Pager [phone]. If 7PM-7AM, please contact night-coverage at [email or web address], Office  [phone]  password [facility] 01/22/2016, 2:21 PM

## 2016-01-23 ENCOUNTER — Other Ambulatory Visit: Payer: Self-pay | Admitting: Internal Medicine

## 2016-01-23 LAB — URINE CULTURE

## 2016-01-23 LAB — TROPONIN I: Troponin I: <0.03 ng/mL (ref <0.031)

## 2016-01-23 LAB — HEMOGLOBIN A1C
HEMOGLOBIN A1C: 5.2 % (ref 4.8–5.6)
Mean Plasma Glucose: 103 mg/dL

## 2016-01-23 LAB — STREP PNEUMONIAE URINARY ANTIGEN: Strep Pneumo Urinary Antigen: NEGATIVE

## 2016-01-23 MED ORDER — PREDNISONE 10 MG PO TABS: ORAL_TABLET | ORAL | Status: DC

## 2016-01-23 MED ORDER — PANTOPRAZOLE SODIUM 40 MG PO TBEC: 40.0000 mg | DELAYED_RELEASE_TABLET | Freq: Every day | ORAL | Status: DC

## 2016-01-23 MED ORDER — HYDROCODONE-ACETAMINOPHEN 5-325 MG PO TABS: 1.0000 | ORAL_TABLET | Freq: Four times a day (QID) | ORAL | Status: DC | PRN

## 2016-01-23 MED ORDER — LEVOFLOXACIN 750 MG PO TABS: 750.0000 mg | ORAL_TABLET | Freq: Every day | ORAL | Status: DC

## 2016-01-23 MED ORDER — GUAIFENESIN ER 600 MG PO TB12
600.0000 mg | ORAL_TABLET | Freq: Two times a day (BID) | ORAL | Status: DC
Start: 2016-01-23 — End: 2017-04-14

## 2016-01-23 MED ORDER — GUAIFENESIN ER 600 MG PO TB12: 600.0000 mg | ORAL_TABLET | Freq: Two times a day (BID) | ORAL | Status: DC

## 2016-01-23 NOTE — Progress Notes (Signed)
SATURATION QUALIFICATIONS: (This note is used to comply with regulatory documentation for home oxygen)  Patient Saturations on Room Air at Rest = 100  Patient Saturations on Room Air while Ambulating = 96%  Patient Saturations on 0 Liters of oxygen while Ambulating = *96  Please briefly explain why patient needs home oxygen:no need

## 2016-01-23 NOTE — Progress Notes (Signed)
[facility].  This CM called [facility] to attempt to make a follow up appointment for pt.  Per [facility] rep they can not make appointments 2-3 weeks in the future and the pt would need to call them on July 5th to make an appointment for that week.  This information was given to pt and placed on AVS. Sandford Crazeora Makynzie Dobesh RN,BSN,NCM [phone]

## 2016-01-23 NOTE — Discharge Summary (Signed)
Physician Discharge Summary  Delynn Flavinnthony Sontag [id number] DOB: 1971-06-20 DOA: 01/21/2016  PCP: Ambrose FinlandValerie A Keck, NP  Admit date: 01/21/2016 Discharge date: 01/23/2016  Time spent: 35* minutes  Recommendations for Outpatient Follow-up:  1. Follow PCP in 3 weeks 2. Will need repeat chest x-ray in 3 weeks to check for resolution of pneumonia   Discharge Diagnoses:  Principal Problem:   CAP (community acquired pneumonia) Active Problems:   COPD pfts pending/ still smoking    Right middle lobe pneumonia   Glucosuria with normal serum glucose   Lactic acidosis   CKD (chronic kidney disease) stage 3, GFR 30-59 ml/min   Discharge condition :Stable  Diet recommendation: Heart healthy diet  Filed Weights   01/21/16 1824  Weight: 72.576 kg (160 lb)    History of present illness:  45 y.o. male with medical history significant of Asthma, COPD / emphyzema, smoking history and still smokes 1-2 cigars a day. Patient presents to ED with cough, fever, congestion. Symptoms onset 3 days ago, has been ongoing, worse when lying down. Body aches and chills. No vomiting nor diarrhea.  Hospital Course:  1. Community-acquired pneumonia- patient started on Levaquin, . Blood cultures 2 have been obtained, results are negative so far . Lactic acid is now 1.5. Patient is clinically improved, will be discharged on Levaquin 750 mg by mouth daily for 7 more days. He would need repeat chest x-ray 3 weeks to check for resolution of pneumonia. He'll follow-up with the PCP for chest x-ray as outpatient. 2. COPD exacerbation- continue with DuoNeb every 6 hours, patient was started on Solu-Medrol 60 mg IV every 6 hours. Mucinex 1 tablet by mouth twice a day. On ambulation O2 sats were 96% on room air. We discharged patient on her prednisone taper, Mucinex, DuoNeb nebulizers 3 times a day when necessary 3. Glucosuria- UA showed glucose. Hemoglobin A1c has been ordered which came back 5.2..  Glucose is well  controlled in the hospital. 4. Elevated troponin- mild elevation of troponin, 0.04.  serial troponin obtained which was less than 0.03. EKG showed sinus tachycardia, which is now resolved. 5. CKD stage III- creatinine at baseline. Today creatinine is 1.6  Procedures:  None  Consultations:  None  Discharge Exam: Filed Vitals:   01/23/16 0134 01/23/16 0520  BP:  143/89  Pulse: 82 85  Temp:  97.9 F (36.6 C)  Resp: 18 18    General: Appears in no acute distress Cardiovascular: S1-S2 regular Respiratory: Clear to auscultation bilaterally  Discharge Instructions   Discharge Instructions    Diet - low sodium heart healthy    Complete by:  As directed      Discharge instructions    Complete by:  As directed   Will need repeat chest xray in 3 weeks     Increase activity slowly    Complete by:  As directed           Current Discharge Medication List    START taking these medications   Details  guaiFENesin (MUCINEX) 600 MG 12 hr tablet Take 1 tablet (600 mg total) by mouth 2 (two) times daily. Qty: 14 tablet, Refills: 0    HYDROcodone-acetaminophen (LORTAB) 5-325 MG tablet Take 1 tablet by mouth every 6 (six) hours as needed for moderate pain. Qty: 15 tablet, Refills: 0    levofloxacin (LEVAQUIN) 750 MG tablet Take 1 tablet (750 mg total) by mouth daily. Qty: 7 tablet, Refills: 0    pantoprazole (PROTONIX) 40 MG tablet Take 1 tablet (40  mg total) by mouth daily. Qty: 30 tablet, Refills: 2      CONTINUE these medications which have CHANGED   Details  predniSONE (DELTASONE) 10 MG tablet Prednisone 40 mg po daily x 1 day then Prednisone 30 mg po daily x 1 day then Prednisone 20 mg po daily x 1 day then Prednisone 10 mg daily x 1 day then stop... Qty: 10 tablet, Refills: 0      CONTINUE these medications which have NOT CHANGED   Details  ipratropium-albuterol (DUONEB) 0.5-2.5 (3) MG/3ML SOLN Take 3 mLs by nebulization every 6 (six) hours as needed. Qty: 360 mL,  Refills: 1   Associated Diagnoses: COPD exacerbation (HCC)    Multiple Vitamin (MULTIVITAMIN WITH MINERALS) TABS tablet Take 1 tablet by mouth daily.    tetrahydrozoline 0.05 % ophthalmic solution Place 1 drop into both eyes 2 (two) times daily as needed (itching).    albuterol (VENTOLIN HFA) 108 (90 Base) MCG/ACT inhaler INHALE 2 PUFFS INTO THE LUNGS EVERY 6 HOURS AS NEEDED FOR WHEEZING OR SHORTNESS OF BREATH. Qty: 54 g, Refills: 3    fluticasone furoate-vilanterol (BREO ELLIPTA) 100-25 MCG/INH AEPB Inhale 1 puff into the lungs every morning. Qty: 180 each, Refills: 3   Associated Diagnoses: COPD mixed type (HCC)      STOP taking these medications     albuterol (PROVENTIL) (5 MG/ML) 0.5% nebulizer solution      azithromycin (ZITHROMAX) 250 MG tablet        Allergies  Allergen Reactions  . Peanut-Containing Drug Products Anaphylaxis  . Shellfish Allergy Anaphylaxis and Shortness Of Breath  . Amoxicillin Hives    Has patient had a PCN reaction causing immediate rash, facial/tongue/throat swelling, SOB or lightheadedness with hypotension:  Has patient had a PCN reaction causing severe rash involving mucus membranes or skin necrosis:  Has patient had a PCN reaction that required hospitalization Yes Has patient had a PCN reaction occurring within the last 10 years: If all of the above answers are "NO", then may proceed with Cephalosporin use.   . Iodine Nausea And Vomiting and Swelling  . Latex Itching and Rash   Follow-up Information    Follow up with Ambrose Finland, NP.   Specialty:  Internal Medicine   Why:  Call on July 5th to make an appointment   Contact information:   [address] [place] [address] [phone]        The results of significant diagnostics from this hospitalization (including imaging, microbiology, ancillary and laboratory) are listed below for reference.    Significant Diagnostic Studies: Dg Chest 2 View  01/21/2016  CLINICAL DATA:   Mid chest and back pain for 3 days. Shortness of breath. Smoker. EXAM: CHEST  2 VIEW COMPARISON:  PA and lateral chest 11/20/2015. FINDINGS: Airspace disease is seen in the right middle lobe and lingula most consistent with pneumonia. The lungs appear emphysematous. Heart size is normal. IMPRESSION: Right middle lobe and lingular airspace disease consistent with pneumonia. Recommend followup to clearing. Emphysema. Electronically Signed   By: Drusilla Kanner M.D.   On: 01/21/2016 17:15    Microbiology: Recent Results (from the past 240 hour(s))  Urine culture     Status: Abnormal   Collection Time: 01/21/16  6:36 PM  Result Value Ref Range Status   Specimen Description URINE, CLEAN CATCH  Final   Special Requests NONE  Final   Culture MULTIPLE SPECIES PRESENT, SUGGEST RECOLLECTION (A)  Final   Report Status 01/23/2016 FINAL  Final  Culture, blood (routine x 2) Call MD if unable to obtain prior to antibiotics being given     Status: None (Preliminary result)   Collection Time: 01/21/16  9:50 PM  Result Value Ref Range Status   Specimen Description BLOOD RIGHT ANTECUBITAL  Final   Special Requests BOTTLES DRAWN AEROBIC AND ANAEROBIC 5CC  Final   Culture NO GROWTH < 24 HOURS  Final   Report Status PENDING  Incomplete  Culture, blood (routine x 2) Call MD if unable to obtain prior to antibiotics being given     Status: None (Preliminary result)   Collection Time: 01/21/16  9:56 PM  Result Value Ref Range Status   Specimen Description BLOOD RIGHT HAND  Final   Special Requests BOTTLES DRAWN AEROBIC AND ANAEROBIC 5CC  Final   Culture NO GROWTH < 24 HOURS  Final   Report Status PENDING  Incomplete     Labs: Basic Metabolic Panel:  Recent Labs Lab 01/21/16 1651  NA 135  K 3.7  CL 104  CO2 24  GLUCOSE 133*  BUN 13  CREATININE 1.60*  CALCIUM 9.0   Liver Function Tests:  Recent Labs Lab 01/21/16 1651  AST 32  ALT 17  ALKPHOS 91  BILITOT 0.7  PROT 6.0*  ALBUMIN 3.3*   No  results for input(s): LIPASE, AMYLASE in the last 168 hours. No results for input(s): AMMONIA in the last 168 hours. CBC:  Recent Labs Lab 01/21/16 1651  WBC 11.4*  NEUTROABS 9.4*  HGB 15.7  HCT 44.9  MCV 94.3  PLT 212   Cardiac Enzymes:  Recent Labs Lab 01/21/16 2116 01/22/16 1445 01/22/16 2110 01/23/16 0206  TROPONINI 0.04* <0.03 <0.03 <0.03   BNP: BNP (last 3 results) No results for input(s): BNP in the last 8760 hours.  ProBNP (last 3 results) No results for input(s): PROBNP in the last 8760 hours.  CBG: No results for input(s): GLUCAP in the last 168 hours.     SignedMeredeth Ide MD.  [facility] 01/23/2016, 11:12 AM

## 2016-01-24 MED FILL — levoFLOXacin 750 MG TABS: 750 | 7 days supply | Qty: 7 | Fill #0

## 2016-01-24 MED FILL — ?PANTOPRAZOLE SOD DR 40MG: 40 MG | 30 days supply | Qty: 30 | Fill #0

## 2016-01-24 MED FILL — predniSONE 10 MG TABS: 10 | 4 days supply | Qty: 10 | Fill #0

## 2016-01-26 LAB — CULTURE, BLOOD (ROUTINE X 2)
Culture: NO GROWTH
Culture: NO GROWTH

## 2016-01-29 ENCOUNTER — Inpatient Hospital Stay: Payer: Self-pay | Admitting: Critical Care Medicine

## 2016-04-18 IMAGING — CR DG CHEST 2V
2 series · 2 of 2 positions shown · non-contrast
Comparison: Chest radiograph performed 09/19/2014

CLINICAL DATA: Acute onset of upper expiratory wheezing. Initial
encounter.

EXAM:
CHEST  2 VIEW

[w chest pa]
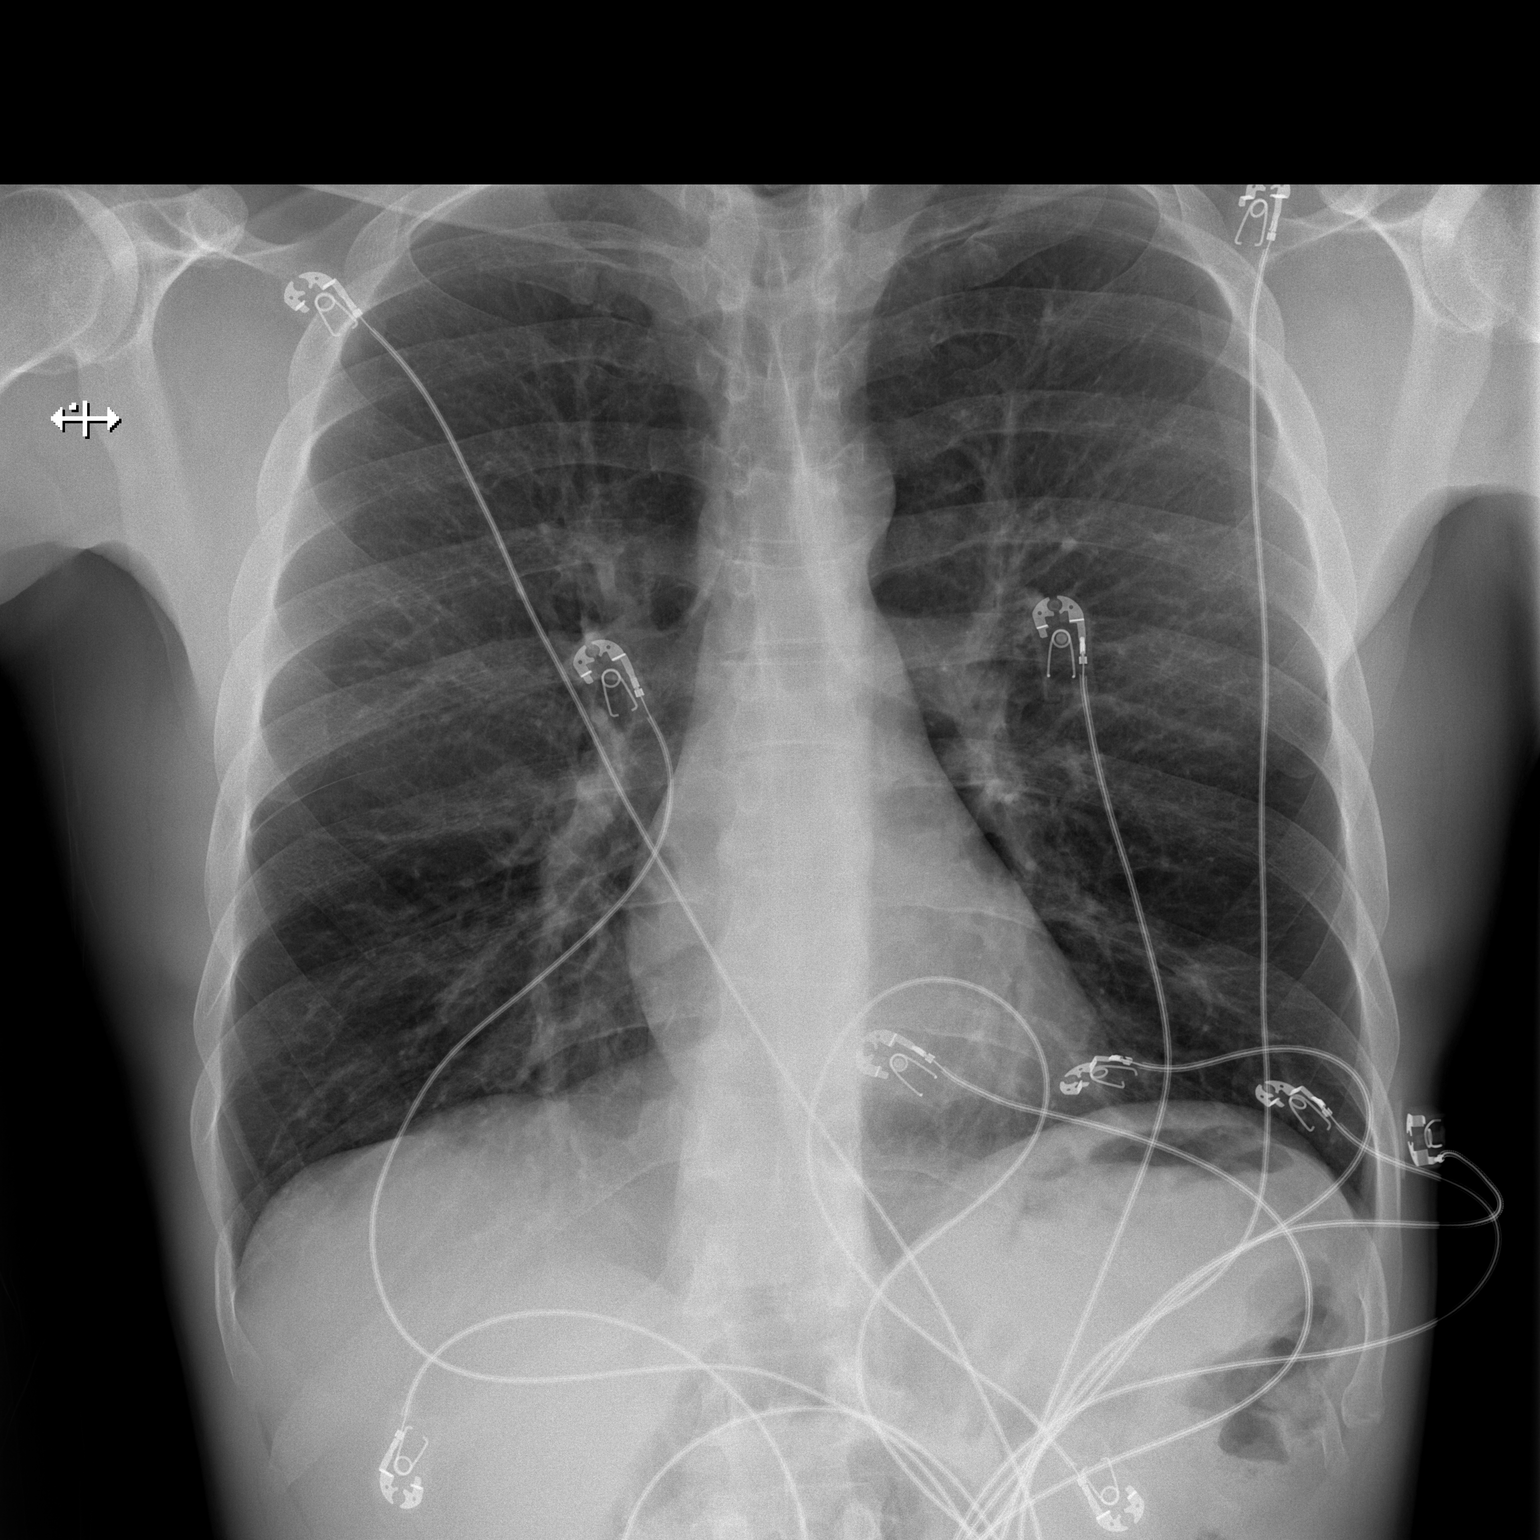

[w chest lat]
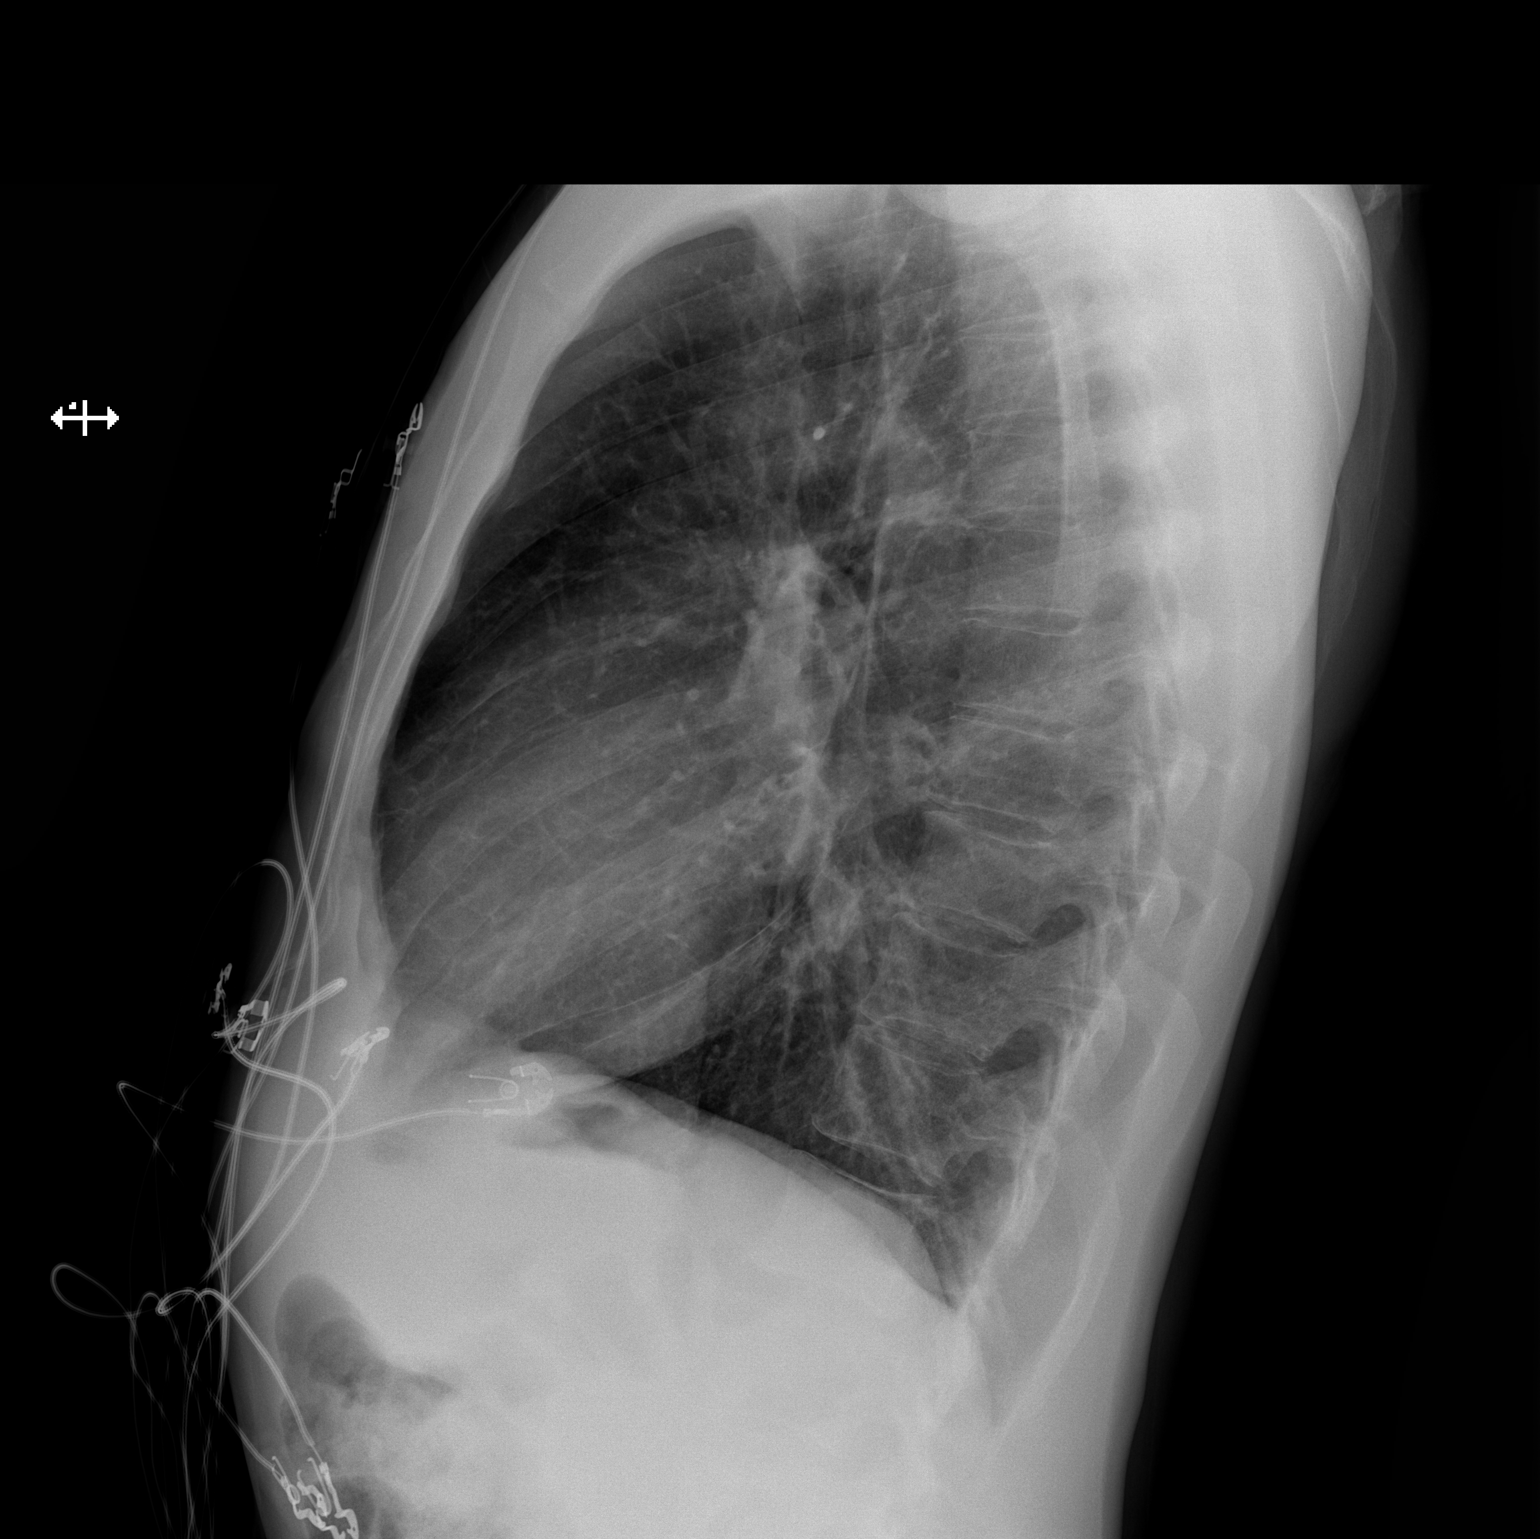

[2 of 2 positions shown; findings below may reference images not displayed]

FINDINGS: The lungs are well-aerated and clear. There is no evidence of focal
opacification, pleural effusion or pneumothorax. A left-sided nipple
shadow is noted.

The heart is normal in size; the mediastinal contour is within
normal limits. No acute osseous abnormalities are seen.
IMPRESSION: No acute cardiopulmonary process seen.

## 2016-08-05 MED FILL — IPRAT-ALBUT 0.5-3(2.5) MG/3: 0.5-2.5 (3) | 30 days supply | Qty: 360 | Fill #1

## 2016-08-05 MED FILL — !VENTOLIN HFA INHALER: 108 (90 BAS | 28 days supply | Qty: 18 | Fill #0

## 2016-11-03 ENCOUNTER — Emergency Department (HOSPITAL_COMMUNITY)
Admission: EM | Admit: 2016-11-03 | Discharge: 2016-11-03 | Disposition: A | Discharge status: Home / Self Care | Payer: Self-pay | Attending: Emergency Medicine | Admitting: Emergency Medicine

## 2016-11-03 ENCOUNTER — Encounter (HOSPITAL_COMMUNITY): Payer: Self-pay

## 2016-11-03 DIAGNOSIS — J449 Chronic obstructive pulmonary disease, unspecified: Secondary | ICD-10-CM | POA: Insufficient documentation

## 2016-11-03 DIAGNOSIS — F17290 Nicotine dependence, other tobacco product, uncomplicated: Secondary | ICD-10-CM | POA: Insufficient documentation

## 2016-11-03 DIAGNOSIS — Z91040 Latex allergy status: Secondary | ICD-10-CM | POA: Insufficient documentation

## 2016-11-03 DIAGNOSIS — N183 Chronic kidney disease, stage 3 (moderate): Secondary | ICD-10-CM | POA: Insufficient documentation

## 2016-11-03 DIAGNOSIS — K029 Dental caries, unspecified: Secondary | ICD-10-CM | POA: Insufficient documentation

## 2016-11-03 DIAGNOSIS — Z91010 Allergy to peanuts: Secondary | ICD-10-CM | POA: Insufficient documentation

## 2016-11-03 DIAGNOSIS — Z79899 Other long term (current) drug therapy: Secondary | ICD-10-CM | POA: Insufficient documentation

## 2016-11-03 MED ORDER — TRAMADOL HCL 50 MG PO TABS: 50.0000 mg | ORAL_TABLET | Freq: Four times a day (QID) | ORAL | 0 refills | Status: DC | PRN

## 2016-11-03 MED ORDER — NAPROXEN 500 MG PO TABS: 500.0000 mg | ORAL_TABLET | Freq: Two times a day (BID) | ORAL | 0 refills | Status: DC

## 2016-11-03 MED ORDER — DOXYCYCLINE HYCLATE 100 MG PO CAPS: 100.0000 mg | ORAL_CAPSULE | Freq: Two times a day (BID) | ORAL | 0 refills | Status: DC

## 2016-11-03 MED ORDER — OXYCODONE-ACETAMINOPHEN 5-325 MG PO TABS
1.0000 | ORAL_TABLET | Freq: Once | ORAL | Status: AC
  Administered 2016-11-03: 1 via ORAL
  Filled 2016-11-03: qty 1

## 2016-11-03 NOTE — ED Triage Notes (Signed)
Pt endorses right upper dental pain x 2 days, pt has appointment with dentist next Tuesday. Pt has been taking otc meds without relief. VSS.

## 2016-11-03 NOTE — ED Notes (Signed)
EDP at bedside  

## 2016-11-03 NOTE — ED Notes (Signed)
Upon attempting to d/c pt, pt became visibly irritated and states "I have tried Aleve, ibuprofen, and 5 extra strength Tylenol and none of that works. This thing that you gave me today hasn't even worked. The reason I came to the ED is to get something stronger." Pt also reports he is allergic to naproxen and "I tell you every time I'm here so why isn't in the computer, I can't take this." This nurse explained to pt his allergy to naproxen would be added to his chart and the provider would be notified. Pt was also educated about policy regarding narcotics and the expectations on providers when prescribing them. Onalee Huaavid, NP aware of situation. Pt visibly angry but reports he "just wants to know what the policy is, I've gotten these prescriptions for my back before, I'm not a pain med seeker, they've had to call my primary care before, I just want something for this pain." Explained to pt the medication given to him at the ED is to attempt to make sx manageable but his pain will not subside until he is able to follow up with a dentist.

## 2016-11-03 NOTE — ED Provider Notes (Signed)
[facility] Provider Note   CSN: [id number] Arrival date & time: 11/03/16  1735   By signing my name below, I, Clovis Pu, attest that this documentation has been prepared under the direction and in the presence of  Felicie Morn, NP . Electronically Signed: Clovis Pu, ED Scribe. 11/03/16. 7:18 PM.   History   Chief Complaint Chief Complaint  Patient presents with  . Dental Pain    HPI Comments:  Keith Weber is a 46 y.o. male who presents to the Emergency Department complaining of acute onset, moderate left upper dental pain x several days. He has taken OTC medications with no relief. No other modifying factors noted. Pt denies any other associated symptoms. Pt notes he has an appointment with a dentist on 11/30/2016. No other complaints noted.    The history is provided by the patient. No language interpreter was used.  Dental Pain   This is a new problem. The current episode started more than 2 days ago. The problem occurs constantly. The problem has not changed since onset.The pain is moderate. Treatments tried: OTC medications. The treatment provided no relief.    Past Medical History:  Diagnosis Date  . Asthma   . COPD (chronic obstructive pulmonary disease) (HCC)   . GERD (gastroesophageal reflux disease)   . Sciatic nerve pain     Patient Active Problem List   Diagnosis Date Noted  . CAP (community acquired pneumonia) 01/21/2016  . Right middle lobe pneumonia (HCC) 01/21/2016  . Glucosuria with normal serum glucose 01/21/2016  . Lactic acidosis 01/21/2016  . CKD (chronic kidney disease) stage 3, GFR 30-59 ml/min 01/21/2016  . Cigarette smoker 06/12/2015  . COPD pfts pending/ still smoking  06/11/2015  . Asthma, chronic 10/16/2014  . Closed fracture of right thumb 06/16/2014    Past Surgical History:  Procedure Laterality Date  . I&D EXTREMITY Right 06/16/2014   Procedure: IRRIGATION AND DEBRIDEMENT RIGHT THUMB AND FIRST FINGER AND PINNING ;   Surgeon: Dominica Severin, MD;  Location: [facility] OR;  Service: Orthopedics;  Laterality: Right;  . IRRIGATION AND DEBRIDEMENT KNEE Right 06/16/2014   Procedure: IRRIGATION AND DEBRIDEMENT POSTERIOR KNEE;  Surgeon: Dominica Severin, MD;  Location: [facility] OR;  Service: Orthopedics;  Laterality: Right;  . LACERATION REPAIR Right 06/16/2014   Procedure: REPAIR ODF THREE LACERATIONS POSTERIOR KNEE;  Surgeon: Dominica Severin, MD;  Location: [facility] OR;  Service: Orthopedics;  Laterality: Right;       Home Medications    Prior to Admission medications   Medication Sig Start Date End Date Taking? Authorizing Provider  albuterol (VENTOLIN HFA) 108 (90 Base) MCG/ACT inhaler INHALE 2 PUFFS INTO THE LUNGS EVERY 6 HOURS AS NEEDED FOR WHEEZING OR SHORTNESS OF BREATH. 01/22/16   Quentin Angst, MD  doxycycline (VIBRAMYCIN) 100 MG capsule Take 1 capsule (100 mg total) by mouth 2 (two) times daily. One po bid x 7 days 11/03/16   Felicie Morn, NP  fluticasone furoate-vilanterol (BREO ELLIPTA) 100-25 MCG/INH AEPB Inhale 1 puff into the lungs every morning. 01/22/16   Quentin Angst, MD  guaiFENesin (MUCINEX) 600 MG 12 hr tablet Take 1 tablet (600 mg total) by mouth 2 (two) times daily. 01/23/16   Meredeth Ide, MD  HYDROcodone-acetaminophen (LORTAB) 5-325 MG tablet Take 1 tablet by mouth every 6 (six) hours as needed for moderate pain. 01/23/16   Meredeth Ide, MD  ipratropium-albuterol (DUONEB) 0.5-2.5 (3) MG/3ML SOLN Take 3 mLs by nebulization every 6 (six) hours as needed. Patient taking  differently: Take 3 mLs by nebulization every 6 (six) hours as needed (for wheezing or shortness of breath).  11/16/15   Tiffany Neva SeatGreene, PA-C  levofloxacin (LEVAQUIN) 750 MG tablet Take 1 tablet (750 mg total) by mouth daily. 01/23/16   Meredeth IdeGagan S Lama, MD  Multiple Vitamin (MULTIVITAMIN WITH MINERALS) TABS tablet Take 1 tablet by mouth daily.    Historical Provider, MD  naproxen (NAPROSYN) 500 MG tablet Take 1 tablet (500 mg total) by mouth 2 (two)  times daily. 11/03/16   Felicie Mornavid Neal Trulson, NP  pantoprazole (PROTONIX) 40 MG tablet Take 1 tablet (40 mg total) by mouth daily. 01/23/16   Meredeth IdeGagan S Lama, MD  predniSONE (DELTASONE) 10 MG tablet Prednisone 40 mg po daily x 1 day then Prednisone 30 mg po daily x 1 day then Prednisone 20 mg po daily x 1 day then Prednisone 10 mg daily x 1 day then stop... 01/23/16   Meredeth IdeGagan S Lama, MD  tetrahydrozoline 0.05 % ophthalmic solution Place 1 drop into both eyes 2 (two) times daily as needed (itching).    Historical Provider, MD    Family History Family History  Problem Relation Age of Onset  . Family history unknown: Yes    Social History Social History  Substance Use Topics  . Smoking status: Current Some Day Smoker    Packs/day: 0.00    Years: 31.00    Types: Cigars  . Smokeless tobacco: Not on file  . Alcohol use 0.6 oz/week    1 Cans of beer per week     Comment: occ     Allergies   Peanut-containing drug products; Shellfish allergy; Amoxicillin; Iodine; and Latex   Review of Systems Review of Systems  Constitutional: Negative for fever.  HENT: Positive for dental problem.   All other systems reviewed and are negative.  Physical Exam Updated Vital Signs BP (!) 153/90 (BP Location: Left Arm)   Pulse 78   Temp 98.5 F (36.9 C) (Oral)   Resp 16   Ht 5\' 9"  (1.753 m)   Wt 160 lb (72.6 kg)   SpO2 98%   BMI 23.63 kg/m   Physical Exam  Constitutional: He is oriented to person, place, and time. He appears well-developed and well-nourished.  HENT:  Head: Normocephalic and atraumatic.  Mouth/Throat:    Eyes: EOM are normal.  Neck: Normal range of motion.  Cardiovascular: Normal rate, regular rhythm, normal heart sounds and intact distal pulses.   Pulmonary/Chest: Effort normal and breath sounds normal. No respiratory distress.  Abdominal: Soft. He exhibits no distension. There is no tenderness.  Musculoskeletal: Normal range of motion.  Neurological: He is alert and oriented to  person, place, and time.  Skin: Skin is warm and dry.  Psychiatric: He has a normal mood and affect. Judgment normal.  Nursing note and vitals reviewed.    ED Treatments / Results  DIAGNOSTIC STUDIES:  Oxygen Saturation is 98% on RA, normal by my interpretation.    COORDINATION OF CARE:  7:15 PM Discussed treatment plan with pt at bedside and pt agreed to plan.  Labs (all labs ordered are listed, but only abnormal results are displayed) Labs Reviewed - No data to display  EKG  EKG Interpretation None       Radiology No results found.  Procedures Procedures (including critical care time)  Medications Ordered in ED Medications  oxyCODONE-acetaminophen (PERCOCET/ROXICET) 5-325 MG per tablet 1 tablet (1 tablet Oral Given 11/03/16 1931)     Initial Impression / Assessment and  Plan / ED Course  I have reviewed the triage vital signs and the nursing notes.  Pertinent labs & imaging results that were available during my care of the patient were reviewed by me and considered in my medical decision making (see chart for details).     Patient with dentalgia.  No abscess requiring immediate incision and drainage.  Exam not concerning for Ludwig's angina or pharyngeal abscess.  Will treat with doxycycline and naproxen. Pt instructed to follow-up with dentist.  Discussed return precautions. Pt safe for discharge.  At time of discharge, patient reports he is allergic to naproxen. He wants a narcotic for pain control. Patient provided prescription for tramadol. Final Clinical Impressions(s) / ED Diagnoses   Final diagnoses:  Pain, dental    New Prescriptions Discharge Medication List as of 11/03/2016  8:11 PM     I personally performed the services described in this documentation, which was scribed in my presence. The recorded information has been reviewed and is accurate.     Felicie Morn, NP 11/03/16 2300    Canary Brim Tegeler, MD 11/04/16 [phone]

## 2017-01-21 ENCOUNTER — Ambulatory Visit: Payer: Self-pay | Admitting: Internal Medicine

## 2017-01-22 MED FILL — !VENTOLIN HFA INHALER: 108 (90 BAS | 25 days supply | Qty: 18 | Fill #0

## 2017-01-22 MED FILL — ?METHYLPREDNISOLONE 4 MG TA: 4 | 6 days supply | Qty: 21 | Fill #0

## 2017-01-22 MED FILL — FLUTICASONE PROP 50 MCG SPR: 50 | 30 days supply | Qty: 16 | Fill #0

## 2017-01-22 MED FILL — !ADVAIR 250/50 DISKUS: 250-50 | 30 days supply | Qty: 60 | Fill #0

## 2017-02-09 ENCOUNTER — Ambulatory Visit: Payer: Self-pay | Admitting: Internal Medicine

## 2017-03-05 ENCOUNTER — Ambulatory Visit: Payer: Self-pay | Admitting: Internal Medicine

## 2017-04-02 ENCOUNTER — Ambulatory Visit: Payer: Self-pay | Attending: Internal Medicine

## 2017-04-14 ENCOUNTER — Encounter (HOSPITAL_COMMUNITY): Payer: Self-pay | Admitting: *Deleted

## 2017-04-14 ENCOUNTER — Ambulatory Visit (HOSPITAL_COMMUNITY)
Admission: EM | Admit: 2017-04-14 | Discharge: 2017-04-14 | Disposition: A | Discharge status: Home / Self Care | Payer: Self-pay | Attending: Family Medicine | Admitting: Family Medicine

## 2017-04-14 DIAGNOSIS — J449 Chronic obstructive pulmonary disease, unspecified: Secondary | ICD-10-CM

## 2017-04-14 DIAGNOSIS — R21 Rash and other nonspecific skin eruption: Secondary | ICD-10-CM

## 2017-04-14 DIAGNOSIS — J441 Chronic obstructive pulmonary disease with (acute) exacerbation: Secondary | ICD-10-CM

## 2017-04-14 DIAGNOSIS — K219 Gastro-esophageal reflux disease without esophagitis: Secondary | ICD-10-CM

## 2017-04-14 MED ORDER — PANTOPRAZOLE SODIUM 40 MG PO TBEC: 40.0000 mg | DELAYED_RELEASE_TABLET | Freq: Every day | ORAL | 0 refills | Status: DC

## 2017-04-14 MED ORDER — ALBUTEROL SULFATE HFA 108 (90 BASE) MCG/ACT IN AERS: INHALATION_SPRAY | RESPIRATORY_TRACT | 0 refills | Status: DC

## 2017-04-14 MED ORDER — FLUTICASONE FUROATE-VILANTEROL 100-25 MCG/INH IN AEPB: 1.0000 | INHALATION_SPRAY | Freq: Every morning | RESPIRATORY_TRACT | 0 refills | Status: DC

## 2017-04-14 MED ORDER — HYDROXYZINE HCL 25 MG PO TABS: 25.0000 mg | ORAL_TABLET | Freq: Four times a day (QID) | ORAL | 0 refills | Status: DC

## 2017-04-14 MED ORDER — HYDROCORTISONE 2.5 % EX LOTN: TOPICAL_LOTION | Freq: Two times a day (BID) | CUTANEOUS | 0 refills | Status: DC

## 2017-04-14 MED ORDER — IPRATROPIUM-ALBUTEROL 0.5-2.5 (3) MG/3ML IN SOLN: 3.0000 mL | Freq: Four times a day (QID) | RESPIRATORY_TRACT | 0 refills | Status: DC | PRN

## 2017-04-14 MED FILL — !VENTOLIN HFA INHALER: 108 (90 BAS | 25 days supply | Qty: 18 | Fill #0

## 2017-04-14 MED FILL — !BREO ELLIPTA 100-25 MCG IN: 100-25 | 30 days supply | Qty: 60 | Fill #0

## 2017-04-14 MED FILL — IPRAT-ALBUT 0.5-3(2.5) MG/3: 0.5-2.5 (3) | 30 days supply | Qty: 360 | Fill #0

## 2017-04-14 MED FILL — hydrOXYzine HCL 25 MG TABS: 25 | 3 days supply | Qty: 12 | Fill #0

## 2017-04-14 MED FILL — ?PANTOPRAZOLE SOD DR 40MG: 40 MG | 30 days supply | Qty: 30 | Fill #0

## 2017-04-14 NOTE — ED Triage Notes (Signed)
Pt  Reports  He  Ran  Out  of  His  Medications     Needs   An  Inhaler   Pt   Also   Rash  On  His   Arms      He   Some   Acid  Reflux     Has    Been outside   Working     On  [employer]         Itches

## 2017-04-14 NOTE — Discharge Instructions (Signed)
It was nice meeting you today [name].   For your rash, you can put the hydrocortisone cream on your arms twice a day. Do not use this cream on your face. You can take one of the Atarax tablets as needed for itching up to every 8 hours. This may make you sleepy.   I have refilled your albuterol, Breo, and nebulizer medications, as well as the Protonix for acid reflux. [facility] to schedule an appointment with a primary doctor. If they do not have any appointments within the next month, you can also call [facility] to try to schedule an appointment.   Be well,  Dr. Natale MilchLancaster

## 2017-04-14 NOTE — ED Provider Notes (Signed)
[facility]    CSN: [id number] Arrival date & time: 04/14/17  1316  History   Chief Complaint Chief Complaint  Patient presents with  . Rash    HPI Keith Weber is a 46 y.o. male.   HPI   Asthma Patient says he was diagnosed with asthma as a young child. He was previously prescribed Breo and albuterol by his PCP in [place], with Duonebs as needed. Since moving here he has had those prescriptions refilled at various emergency rooms, most recently in [place]. He has been without Breo for multiple months, but just ran out of albuterol today. Says he has been using albuterol about four times a day. Has recently had more difficulty breathing than usual due to the humidity. Has also had worsening nighttime cough over the past couple nights. Was seen at [facility] recently and said they told him that his refills had expired. He was going to schedule an appointment to establish care with a PCP at that time, but was told that they first available appointment wasn't for a while, and he needed to come to urgent care if he needed refills.   Rash Developed a rash on his forearms about a week ago. Was initially only on L forearm, has now spread some to R forearm and a little on his forehead and back. Says rash looks like small bumps. Is very itchy. He was working outside cutting grass prior to noticing rash. He has been putting calamine lotion on the affected areas which helps some but does not completely resolve the itching. Also put rubbing alcohol on his arms which only stopped itching momentarily. Has not taken Benadryl or anything by mouth.   GERD Reports history of acid reflux. Was previously prescribed Protonix but has run out of this. Says he experiences burning in his chest and throat. This occurs daily. Is worse when he lays down. Tries to stay away from spicy foods, as this worsens his symptoms.    Past Medical History:  Diagnosis Date  . Asthma   .  COPD (chronic obstructive pulmonary disease) (HCC)   . GERD (gastroesophageal reflux disease)   . Sciatic nerve pain     Patient Active Problem List   Diagnosis Date Noted  . CAP (community acquired pneumonia) 01/21/2016  . Right middle lobe pneumonia (HCC) 01/21/2016  . Glucosuria with normal serum glucose 01/21/2016  . Lactic acidosis 01/21/2016  . CKD (chronic kidney disease) stage 3, GFR 30-59 ml/min 01/21/2016  . Cigarette smoker 06/12/2015  . COPD pfts pending/ still smoking  06/11/2015  . Asthma, chronic 10/16/2014  . Closed fracture of right thumb 06/16/2014    Past Surgical History:  Procedure Laterality Date  . I&D EXTREMITY Right 06/16/2014   Procedure: IRRIGATION AND DEBRIDEMENT RIGHT THUMB AND FIRST FINGER AND PINNING ;  Surgeon: Dominica Severin, MD;  Location: [facility];  Service: Orthopedics;  Laterality: Right;  . IRRIGATION AND DEBRIDEMENT KNEE Right 06/16/2014   Procedure: IRRIGATION AND DEBRIDEMENT POSTERIOR KNEE;  Surgeon: Dominica Severin, MD;  Location: [facility] OR;  Service: Orthopedics;  Laterality: Right;  . LACERATION REPAIR Right 06/16/2014   Procedure: REPAIR ODF THREE LACERATIONS POSTERIOR KNEE;  Surgeon: Dominica Severin, MD;  Location: [facility] OR;  Service: Orthopedics;  Laterality: Right;       Home Medications    Prior to Admission medications   Medication Sig Start Date End Date Taking? Authorizing Provider  albuterol (VENTOLIN HFA) 108 (90 Base) MCG/ACT inhaler INHALE 2 PUFFS INTO  THE LUNGS EVERY 6 HOURS AS NEEDED FOR WHEEZING OR SHORTNESS OF BREATH. 01/22/16   Quentin Angst, MD  doxycycline (VIBRAMYCIN) 100 MG capsule Take 1 capsule (100 mg total) by mouth 2 (two) times daily. One po bid x 7 days 11/03/16   Felicie Morn, NP  fluticasone furoate-vilanterol (BREO ELLIPTA) 100-25 MCG/INH AEPB Inhale 1 puff into the lungs every morning. 01/22/16   Quentin Angst, MD  guaiFENesin (MUCINEX) 600 MG 12 hr tablet Take 1 tablet (600 mg total) by mouth 2 (two) times  daily. 01/23/16   Meredeth Ide, MD  HYDROcodone-acetaminophen (LORTAB) 5-325 MG tablet Take 1 tablet by mouth every 6 (six) hours as needed for moderate pain. 01/23/16   Meredeth Ide, MD  ipratropium-albuterol (DUONEB) 0.5-2.5 (3) MG/3ML SOLN Take 3 mLs by nebulization every 6 (six) hours as needed. Patient taking differently: Take 3 mLs by nebulization every 6 (six) hours as needed (for wheezing or shortness of breath).  11/16/15   Marlon Pel, PA-C  levofloxacin (LEVAQUIN) 750 MG tablet Take 1 tablet (750 mg total) by mouth daily. 01/23/16   Meredeth Ide, MD  Multiple Vitamin (MULTIVITAMIN WITH MINERALS) TABS tablet Take 1 tablet by mouth daily.    [provider]  pantoprazole (PROTONIX) 40 MG tablet Take 1 tablet (40 mg total) by mouth daily. 01/23/16   Meredeth Ide, MD  predniSONE (DELTASONE) 10 MG tablet Prednisone 40 mg po daily x 1 day then Prednisone 30 mg po daily x 1 day then Prednisone 20 mg po daily x 1 day then Prednisone 10 mg daily x 1 day then stop... 01/23/16   Meredeth Ide, MD  tetrahydrozoline 0.05 % ophthalmic solution Place 1 drop into both eyes 2 (two) times daily as needed (itching).    [provider]  traMADol (ULTRAM) 50 MG tablet Take 1 tablet (50 mg total) by mouth every 6 (six) hours as needed for severe pain. 11/03/16   Felicie Morn, NP    Family History Family History  Problem Relation Age of Onset  . Family history unknown: Yes    Social History Social History  Substance Use Topics  . Smoking status: Current Some Day Smoker    Packs/day: 0.00    Years: 31.00    Types: Cigars  . Smokeless tobacco: Not on file  . Alcohol use 0.6 oz/week    1 Cans of beer per week     Comment: occ     Allergies   Peanut-containing drug products; Shellfish allergy; Amoxicillin; Iodine; Naproxen; and Latex   Review of Systems Review of Systems  HENT:       Throat burning   Eyes: Positive for itching.  Respiratory: Positive for cough, shortness  of breath and wheezing.   Cardiovascular: Negative for chest pain.  Gastrointestinal: Negative for abdominal pain, constipation and diarrhea.  Skin: Positive for rash.  Allergic/Immunologic: Positive for environmental allergies.   Physical Exam Triage Vital Signs ED Triage Vitals  Enc Vitals Group     BP 04/14/17 1357 134/76     Pulse Rate 04/14/17 1357 78     Resp 04/14/17 1357 16     Temp 04/14/17 1357 98.2 F (36.8 C)     Temp Source 04/14/17 1357 Oral     SpO2 04/14/17 1357 97 %     Weight 04/14/17 1358 176 lb (79.8 kg)     Height 04/14/17 1358 5\' 11"  (1.803 m)     Head Circumference --  Peak Flow --      Pain Score --      Pain Loc --      Pain Edu? --      Excl. in GC? --    No data found.   Updated Vital Signs BP 134/76 (BP Location: Right Arm)   Pulse 78   Temp 98.2 F (36.8 C) (Oral)   Resp 16   Ht 5\' 11"  (1.803 m)   Wt 176 lb (79.8 kg)   SpO2 97%   BMI 24.55 kg/m   Physical Exam  Constitutional: He is oriented to person, place, and time. He appears well-developed and well-nourished. No distress.  HENT:  Head: Normocephalic and atraumatic.  Nose: Nose normal.  Mouth/Throat: Oropharynx is clear and moist. No oropharyngeal exudate.  Eyes: Pupils are equal, round, and reactive to light. Conjunctivae and EOM are normal. Right eye exhibits no discharge. Left eye exhibits no discharge.  Cardiovascular: Normal rate, regular rhythm and normal heart sounds.   No murmur heard. Pulmonary/Chest: He has wheezes (Faint, upper lobes bilaterally).  Normal WOB on RA. Able to speak in full sentences without difficulty. Good air movement bilaterally.   Abdominal: Soft. Bowel sounds are normal. He exhibits no distension. There is no tenderness.  Small midline hernia present above umbilicus, easily reducible, non-tender  Musculoskeletal: Normal range of motion.  Neurological: He is alert and oriented to person, place, and time.  Skin:  Fine papular rash present in  forearms bilaterally. Few similar small lesions above R eyebrow. Difficulty to tell if erythematous due to patient's skin tone, however no obvious erythema. No excoriations or signs of infection. Skin otherwise warm and dry.     UC Treatments / Results  Labs (all labs ordered are listed, but only abnormal results are displayed) Labs Reviewed - No data to display  EKG  EKG Interpretation None       Radiology No results found.  Procedures Procedures (including critical care time)  Medications Ordered in UC Medications - No data to display   Initial Impression / Assessment and Plan / UC Course  I have reviewed the triage vital signs and the nursing notes.  Pertinent labs & imaging results that were available during my care of the patient were reviewed by me and considered in my medical decision making (see chart for details).    Asthma Symptoms worsening as patient without medications. Was using albuterol up to four times daily, likely because has been without maintenance med for extended period of time. Faint wheezes in upper lobes on exam today, but patient in no distress and breathing comfortably on RA. Discussed importance of establishing care with a PCP for better long-term management of his asthma. Refilled albuterol, Breo, and Duoneb today and encouraged patient to call [facility] or [facility] to schedule an establish care appt within the next month before medication runs out.   GERD Reports symptoms of chest and throat burning after eating, especially worsened by spicy foods and laying flat, consistent with GERD. No oropharyngeal erythema or abnormalities noted on exam. No chest pain today. Refilled Protonix and encouraged to establish with PCP (see above).  Rash Most consistent with contact dermatitis from outdoor working. Symptomatic improvement with calamine lotion, but still pruritic. No signs of infection. Prescribed hydrocortisone cream  and Atarax PRN itching.    Final Clinical Impressions(s) / UC Diagnoses   Final diagnoses:  None    New Prescriptions New Prescriptions   No medications on file  Tarri AbernethyAbigail J Nolia Tschantz, MD, MPH PGY-3     Marquette SaaL[place]aster, [place]oel Kunin Joseph, MD 04/14/17 [phone]

## 2017-05-05 ENCOUNTER — Ambulatory Visit: Payer: Self-pay | Admitting: Internal Medicine

## 2017-06-15 ENCOUNTER — Ambulatory Visit (HOSPITAL_COMMUNITY)
Admission: EM | Admit: 2017-06-15 | Discharge: 2017-06-15 | Disposition: A | Discharge status: Home / Self Care | Payer: Self-pay | Attending: Internal Medicine | Admitting: Internal Medicine

## 2017-06-15 ENCOUNTER — Encounter (HOSPITAL_COMMUNITY): Payer: Self-pay | Admitting: Emergency Medicine

## 2017-06-15 DIAGNOSIS — J449 Chronic obstructive pulmonary disease, unspecified: Secondary | ICD-10-CM

## 2017-06-15 DIAGNOSIS — K219 Gastro-esophageal reflux disease without esophagitis: Secondary | ICD-10-CM

## 2017-06-15 DIAGNOSIS — K469 Unspecified abdominal hernia without obstruction or gangrene: Secondary | ICD-10-CM

## 2017-06-15 MED ORDER — ALBUTEROL SULFATE HFA 108 (90 BASE) MCG/ACT IN AERS: 2.0000 | INHALATION_SPRAY | Freq: Four times a day (QID) | RESPIRATORY_TRACT | 2 refills | Status: DC | PRN

## 2017-06-15 MED ORDER — PANTOPRAZOLE SODIUM 40 MG PO TBEC: 40.0000 mg | DELAYED_RELEASE_TABLET | Freq: Every day | ORAL | 0 refills | Status: DC

## 2017-06-15 NOTE — ED Triage Notes (Signed)
PT reports history of a hernia. PT reports burning sensation on RLQ and LLQ. PT reports he may have had some dysuria in the last week. PT also report intermittent rectal pain.

## 2017-06-15 NOTE — ED Provider Notes (Signed)
[facility]    CSN: [id number] Arrival date & time: 06/15/17  1149     History   Chief Complaint Chief Complaint  Patient presents with  . Abdominal Pain    HPI Keith Weber is a 46 y.o. male.   With long history of abdominal hernia, presents today wanting his "hernia" to be fixed because the hernia "is uncomfortable". However denies severe pain. Abdominal discomfort is localized at the mid lower abdomen at the site of the hernia and pain is intermittent. Patient unable to specify the severity of his pain/discomfort.   Patient also requesting refill for his protonix and albuterol inhaler.     Abdominal Hernia for years, pain started couple week ago, intermittent and becoming more frequent. Unable to specify the pain.         Past Medical History:  Diagnosis Date  . Asthma   . COPD (chronic obstructive pulmonary disease) (HCC)   . GERD (gastroesophageal reflux disease)   . Sciatic nerve pain     Patient Active Problem List   Diagnosis Date Noted  . CAP (community acquired pneumonia) 01/21/2016  . Right middle lobe pneumonia (HCC) 01/21/2016  . Glucosuria with normal serum glucose 01/21/2016  . Lactic acidosis 01/21/2016  . CKD (chronic kidney disease) stage 3, GFR 30-59 ml/min (HCC) 01/21/2016  . Cigarette smoker 06/12/2015  . COPD pfts pending/ still smoking  06/11/2015  . Asthma, chronic 10/16/2014  . Closed fracture of right thumb 06/16/2014    History reviewed. No pertinent surgical history.     Home Medications    Prior to Admission medications   Medication Sig Start Date End Date Taking? Authorizing Provider  fluticasone furoate-vilanterol (BREO ELLIPTA) 100-25 MCG/INH AEPB Inhale 1 puff into the lungs every morning. 04/14/17  Yes Mardella LaymanHagler, Brian, MD  hydrOXYzine (ATARAX/VISTARIL) 25 MG tablet Take 1 tablet (25 mg total) by mouth every 6 (six) hours. 04/14/17  Yes Hagler, Arlys JohnBrian, MD  ipratropium-albuterol (DUONEB) 0.5-2.5 (3) MG/3ML SOLN  Take 3 mLs by nebulization every 6 (six) hours as needed. 04/14/17  Yes Mardella LaymanHagler, Brian, MD  Multiple Vitamin (MULTIVITAMIN WITH MINERALS) TABS tablet Take 1 tablet by mouth daily.   Yes [provider]  albuterol (PROVENTIL HFA;VENTOLIN HFA) 108 (90 Base) MCG/ACT inhaler Inhale 2 puffs every 6 (six) hours as needed into the lungs for wheezing or shortness of breath. 06/15/17   Lucia EstelleZheng, Kellsie Grindle, NP  hydrocortisone 2.5 % lotion Apply topically 2 (two) times daily. 04/14/17   Mardella LaymanHagler, Brian, MD  pantoprazole (PROTONIX) 40 MG tablet Take 1 tablet (40 mg total) daily by mouth. 06/15/17 09/13/17  Lucia EstelleZheng, Bobbi Kozakiewicz, NP    Family History Family History  Family history unknown: Yes    Social History Social History   Tobacco Use  . Smoking status: Current Some Day Smoker    Packs/day: 0.00    Years: 31.00    Pack years: 0.00    Types: Cigars  Substance Use Topics  . Alcohol use: Yes    Alcohol/week: 0.6 oz    Types: 1 Cans of beer per week    Comment: occ  . Drug use: Yes    Types: Marijuana     Allergies   Aspirin; Peanut-containing drug products; Shellfish allergy; Amoxicillin; Iodine; Naproxen; and Latex   Review of Systems Review of Systems  Constitutional:       See HPI  Respiratory: Negative.   Cardiovascular: Negative.   Gastrointestinal: Positive for abdominal pain. Negative for diarrhea and nausea.  Neurological: Negative for  dizziness and headaches.     Physical Exam Triage Vital Signs ED Triage Vitals  Enc Vitals Group     BP 06/15/17 1236 124/81     Pulse Rate 06/15/17 1236 70     Resp 06/15/17 1236 16     Temp 06/15/17 1236 98.2 F (36.8 C)     Temp Source 06/15/17 1236 Oral     SpO2 06/15/17 1236 100 %     Weight 06/15/17 1234 160 lb (72.6 kg)     Height 06/15/17 1234 5\' 11"  (1.803 m)     Head Circumference --      Peak Flow --      Pain Score 06/15/17 1234 4     Pain Loc --      Pain Edu? --      Excl. in GC? --    No data found.  Updated Vital Signs BP  124/81 (BP Location: Left Arm)   Pulse 70   Temp 98.2 F (36.8 C) (Oral)   Resp 16   Ht 5\' 11"  (1.803 m)   Wt 160 lb (72.6 kg)   SpO2 100%   BMI 22.32 kg/m   Visual Acuity Right Eye Distance:   Left Eye Distance:   Bilateral Distance:    Right Eye Near:   Left Eye Near:    Bilateral Near:     Physical Exam  Constitutional: He appears well-developed and well-nourished.  Appears comfortable.   HENT:  Head: Normocephalic.  Cardiovascular: Normal rate and normal heart sounds.  Pulmonary/Chest: Effort normal and breath sounds normal.  Abdominal: Soft. Normal appearance and bowel sounds are normal. He exhibits no pulsatile midline mass. There is no tenderness.  A small quarter size hernia noted at mid lower abdomen at standing position, Disappear at supine position. No pain on palpation.   Nursing note and vitals reviewed.    UC Treatments / Results  Labs (all labs ordered are listed, but only abnormal results are displayed) Labs Reviewed - No data to display  EKG  EKG Interpretation None       Radiology No results found.  Procedures Procedures (including critical care time)  Medications Ordered in UC Medications - No data to display   Initial Impression / Assessment and Plan / UC Course  I have reviewed the triage vital signs and the nursing notes.  Pertinent labs & imaging results that were available during my care of the patient were reviewed by me and considered in my medical decision making (see chart for details).  Final Clinical Impressions(s) / UC Diagnoses   Final diagnoses:  Non-recurrent abdominal hernia without obstruction or gangrene, unspecified hernia type  Gastroesophageal reflux disease without esophagitis  Chronic obstructive pulmonary disease, unspecified COPD type (HCC)   No red flags noted exam that would warrant an ER visit. Education provided on hernia. Informed that we do not "fix" hernia in Urgent Care setting. Information on  General Surgeon given to patient. Patient agreed to contact general surgeon once he obtained insurance. Albuterol and Protonix refilled per his request.   ED Discharge Orders        Ordered    albuterol (PROVENTIL HFA;VENTOLIN HFA) 108 (90 Base) MCG/ACT inhaler  Every 6 hours PRN     06/15/17 1402    pantoprazole (PROTONIX) 40 MG tablet  Daily     06/15/17 1402     Controlled Substance Prescriptions [place] Controlled Substance Registry consulted? Not Applicable   Lucia EstelleZheng, Savhanna Sliva, NP 06/15/17 1410

## 2017-07-05 ENCOUNTER — Encounter: Payer: Self-pay | Admitting: Nurse Practitioner

## 2017-07-05 ENCOUNTER — Ambulatory Visit: Payer: Self-pay | Attending: Nurse Practitioner | Admitting: Nurse Practitioner

## 2017-07-05 VITALS — BP 135/82 | HR 64 | Temp 98.7°F | Resp 16 | Ht 69.69 in | Wt 159.8 lb

## 2017-07-05 DIAGNOSIS — Z23 Encounter for immunization: Secondary | ICD-10-CM | POA: Insufficient documentation

## 2017-07-05 DIAGNOSIS — Z0000 Encounter for general adult medical examination without abnormal findings: Secondary | ICD-10-CM | POA: Insufficient documentation

## 2017-07-05 DIAGNOSIS — M5430 Sciatica, unspecified side: Secondary | ICD-10-CM | POA: Insufficient documentation

## 2017-07-05 DIAGNOSIS — K439 Ventral hernia without obstruction or gangrene: Secondary | ICD-10-CM | POA: Insufficient documentation

## 2017-07-05 DIAGNOSIS — F17210 Nicotine dependence, cigarettes, uncomplicated: Secondary | ICD-10-CM | POA: Insufficient documentation

## 2017-07-05 DIAGNOSIS — K219 Gastro-esophageal reflux disease without esophagitis: Secondary | ICD-10-CM | POA: Insufficient documentation

## 2017-07-05 DIAGNOSIS — J449 Chronic obstructive pulmonary disease, unspecified: Secondary | ICD-10-CM | POA: Insufficient documentation

## 2017-07-05 DIAGNOSIS — J45909 Unspecified asthma, uncomplicated: Secondary | ICD-10-CM

## 2017-07-05 DIAGNOSIS — Z79899 Other long term (current) drug therapy: Secondary | ICD-10-CM | POA: Insufficient documentation

## 2017-07-05 DIAGNOSIS — R05 Cough: Secondary | ICD-10-CM | POA: Insufficient documentation

## 2017-07-05 MED ORDER — IPRATROPIUM-ALBUTEROL 0.5-2.5 (3) MG/3ML IN SOLN
3.0000 mL | Freq: Once | RESPIRATORY_TRACT | Status: AC
  Administered 2017-07-05: 3 mL via RESPIRATORY_TRACT

## 2017-07-05 MED ORDER — FLUTICASONE FUROATE-VILANTEROL 100-25 MCG/INH IN AEPB: 1.0000 | INHALATION_SPRAY | Freq: Every morning | RESPIRATORY_TRACT | 0 refills | Status: DC

## 2017-07-05 MED ORDER — PANTOPRAZOLE SODIUM 40 MG PO TBEC: 40.0000 mg | DELAYED_RELEASE_TABLET | Freq: Every day | ORAL | 0 refills | Status: DC

## 2017-07-05 MED ORDER — ALBUTEROL SULFATE HFA 108 (90 BASE) MCG/ACT IN AERS: 2.0000 | INHALATION_SPRAY | Freq: Four times a day (QID) | RESPIRATORY_TRACT | 2 refills | Status: DC | PRN

## 2017-07-05 MED FILL — HYDROCORTISONE 2.5% LOTION: 2.5 | 30 days supply | Qty: 59 | Fill #0

## 2017-07-05 MED FILL — !VENTOLIN HFA INHALER: 108 (90 BAS | 25 days supply | Qty: 18 | Fill #0

## 2017-07-05 MED FILL — ?PANTOPRAZOLE SOD DR 40MG: 40 MG | 30 days supply | Qty: 30 | Fill #0

## 2017-07-05 NOTE — Progress Notes (Signed)
Assessment & Plan:  Keith Weber was seen today for establish care.  Diagnoses and all orders for this visit:  Asthma, unspecified asthma severity, unspecified whether complicated, unspecified whether persistent -     albuterol (PROVENTIL HFA;VENTOLIN HFA) 108 (90 Base) MCG/ACT inhaler; Inhale 2 puffs into the lungs every 6 (six) hours as needed for wheezing or shortness of breath. -     fluticasone furoate-vilanterol (BREO ELLIPTA) 100-25 MCG/INH AEPB; Inhale 1 puff into the lungs every morning. -     ipratropium-albuterol (DUONEB) 0.5-2.5 (3) MG/3ML nebulizer solution 3 mL Patient with improved respiratory symptoms, decreased wheezing and cough post treatment.   Ventral hernia without obstruction or gangrene -     Ambulatory referral to General Surgery  Immunization due -     Flu Vaccine QUAD 6+ mos PF IM (Fluarix Quad PF)  Routine adult health maintenance -     Flu Vaccine QUAD 6+ mos PF IM (Fluarix Quad PF) -     CMP14+EGFR -     CBC -     Lipid panel  Gastroesophageal reflux disease, esophagitis presence not specified -     pantoprazole (PROTONIX) 40 MG tablet; Take 1 tablet (40 mg total) by mouth daily.    Patient has been counseled on age-appropriate routine health concerns for screening and prevention. These are reviewed and up-to-date. Referrals have been placed accordingly. Immunizations are up-to-date or declined.    Subjective:   Chief Complaint  Patient presents with  . Establish Care    Patient is here follow up hernia and patient would like medication refills. Patient stated that he been getting mirganes lately.    HPI Keith Weber 46 y.o. male presents to office today to establish care. He is uninsured and has been using the Urgent care for many of his health care needs. Reports "it's easier to just go there".   Hernia He has a history of abdominal hernia for several years (7 years). He went to the Urgent Care on 06-15-2017 with a request to have his hernia  "fixed".  Today he reports it has been "bothering him".  When I asked for clarification of what bothering him meant he was unable to elaborate. He states "It's not pain, it's just uncomfortable and bothers me". He would like a referral to surgery to have it removed. He denies fevers or any other abdominal symptoms including constipation, diarrhea, melena, hematochezia, nausea or vomiting.     GERD He has been out of protonix. Has been taking OTC medications with little relief of symptoms. Requesting refill. Symptoms include heartburn, burning in throat. Denies regurgitation of stomach contents or chest pain.    Asthma Diagnosed as a child. He continues to smoke but reports he has started cutting back on cigarettes. States "it takes me a whole week to smoke a pack of cigarettes". He has been out of his BREO and albuterol. Endorses cough, SOB and wheezing. He has been using duonebs continuously due to being out of his maintenance inhaler. He was given one BREO and albuterol inhaler in September and has not had a refill since then. I instructed him that he should not have run out of his albuterol inhaler as this is considered a rescue inhaler and not a maintenance inhaler and should usually last a person whose asthma is controlled at least 6 months to one year.    Review of Systems  Constitutional: Negative for fever, malaise/fatigue and weight loss.  HENT: Negative.  Negative for nosebleeds.   Eyes:  Negative.  Negative for blurred vision, double vision and photophobia.  Respiratory: Positive for cough and wheezing. Negative for shortness of breath.   Cardiovascular: Negative.  Negative for chest pain, palpitations and leg swelling.  Gastrointestinal: Positive for heartburn. Negative for abdominal pain, blood in stool, constipation, diarrhea, melena, nausea and vomiting.  Musculoskeletal: Negative.  Negative for myalgias.  Neurological: Negative.  Negative for dizziness, focal weakness, seizures and  headaches.  Endo/Heme/Allergies: Negative for environmental allergies.  Psychiatric/Behavioral: Negative.  Negative for suicidal ideas.    Past Medical History:  Diagnosis Date  . Asthma   . COPD (chronic obstructive pulmonary disease) ([place])   . GERD (gastroesophageal reflux disease)   . Sciatic nerve pain     Past Surgical History:  Procedure Laterality Date  . I&D EXTREMITY Right 06/16/2014   Procedure: IRRIGATION AND DEBRIDEMENT RIGHT THUMB AND FIRST FINGER AND PINNING ;  Surgeon: Roseanne Kaufman, MD;  Location: [facility];  Service: Orthopedics;  Laterality: Right;  . IRRIGATION AND DEBRIDEMENT KNEE Right 06/16/2014   Procedure: IRRIGATION AND DEBRIDEMENT POSTERIOR KNEE;  Surgeon: Roseanne Kaufman, MD;  Location: [facility];  Service: Orthopedics;  Laterality: Right;  . LACERATION REPAIR Right 06/16/2014   Procedure: REPAIR ODF THREE LACERATIONS POSTERIOR KNEE;  Surgeon: Roseanne Kaufman, MD;  Location: [facility];  Service: Orthopedics;  Laterality: Right;    Family History  Family history unknown: Yes    Social History Reviewed with no changes to be made today.   Outpatient Medications Prior to Visit  Medication Sig Dispense Refill  . ipratropium-albuterol (DUONEB) 0.5-2.5 (3) MG/3ML SOLN Take 3 mLs by nebulization every 6 (six) hours as needed. (Patient not taking: Reported on 07/05/2017) 360 mL 0  . Multiple Vitamin (MULTIVITAMIN WITH MINERALS) TABS tablet Take 1 tablet by mouth daily.    Marland Kitchen albuterol (PROVENTIL HFA;VENTOLIN HFA) 108 (90 Base) MCG/ACT inhaler Inhale 2 puffs every 6 (six) hours as needed into the lungs for wheezing or shortness of breath. (Patient not taking: Reported on 07/05/2017) 1 Inhaler 2  . fluticasone furoate-vilanterol (BREO ELLIPTA) 100-25 MCG/INH AEPB Inhale 1 puff into the lungs every morning. (Patient not taking: Reported on 07/05/2017) 180 each 0  . hydrocortisone 2.5 % lotion Apply topically 2 (two) times daily. (Patient not taking: Reported on 07/05/2017) 59 mL 0    . hydrOXYzine (ATARAX/VISTARIL) 25 MG tablet Take 1 tablet (25 mg total) by mouth every 6 (six) hours. (Patient not taking: Reported on 07/05/2017) 12 tablet 0  . pantoprazole (PROTONIX) 40 MG tablet Take 1 tablet (40 mg total) daily by mouth. (Patient not taking: Reported on 07/05/2017) 90 tablet 0   No facility-administered medications prior to visit.     Allergies  Allergen Reactions  . Aspirin Anaphylaxis  . Peanut-Containing Drug Products Anaphylaxis  . Shellfish Allergy Anaphylaxis and Shortness Of Breath  . Amoxicillin Hives    Has patient had a PCN reaction causing immediate rash, facial/tongue/throat swelling, SOB or lightheadedness with hypotension:  Has patient had a PCN reaction causing severe rash involving mucus membranes or skin necrosis:  Has patient had a PCN reaction that required hospitalization Yes Has patient had a PCN reaction occurring within the last 10 years: If all of the above answers are "NO", then may proceed with Cephalosporin use.   . Iodine Nausea And Vomiting and Swelling  . Kiwi Extract Swelling  . Naproxen Hives  . Latex Itching and Rash       Objective:    BP 135/82 (BP Location: Left Arm, Cuff  Size: Normal)   Pulse 64   Temp 98.7 F (37.1 C) (Oral)   Resp 18   Ht 5' 9.69" (1.77 m)   Wt 159 lb 12.8 oz (72.5 kg)   SpO2 100%   BMI 23.14 kg/m  Wt Readings from Last 3 Encounters:  07/05/17 159 lb 12.8 oz (72.5 kg)  06/15/17 160 lb (72.6 kg)  04/14/17 176 lb (79.8 kg)    Physical Exam  Constitutional: He is oriented to person, place, and time. He appears well-developed and well-nourished. He is cooperative.  HENT:  Head: Normocephalic and atraumatic.  Eyes: EOM are normal.  Neck: Normal range of motion.  Cardiovascular: Normal rate, regular rhythm, normal heart sounds and intact distal pulses. Exam reveals no gallop and no friction rub.  No murmur heard. Pulmonary/Chest: Effort normal. No accessory muscle usage. Tachypnea noted. No  respiratory distress. He has no decreased breath sounds. He has wheezes in the right upper field, the right middle field, the right lower field, the left upper field, the left middle field and the left lower field. He has no rhonchi. He has no rales. He exhibits no tenderness.  Abdominal: Soft. Bowel sounds are normal. There is no hepatosplenomegaly. There is no tenderness. There is no rebound and no CVA tenderness. A hernia is present. Hernia confirmed positive in the ventral area.    Musculoskeletal: Normal range of motion. He exhibits no edema.  Neurological: He is alert and oriented to person, place, and time. Coordination normal.  Skin: Skin is warm and dry.  Psychiatric: He has a normal mood and affect. His behavior is normal. Judgment and thought content normal.  Nursing note and vitals reviewed.        Patient has been counseled extensively about nutrition and exercise as well as the importance of adherence with medications and regular follow-up. The patient was given clear instructions to go to ER or return to medical center if symptoms don't improve, worsen or new problems develop. The patient verbalized understanding.   Follow-up: Return in about 3 months (around 10/05/2017) for Needs appointment with financial representative.Gildardo Pounds, FNP-BC [facility] [place], [place]   07/06/2017, 2:15 PM

## 2017-07-12 ENCOUNTER — Other Ambulatory Visit: Payer: Self-pay

## 2017-07-14 ENCOUNTER — Other Ambulatory Visit: Payer: Self-pay

## 2017-07-14 ENCOUNTER — Ambulatory Visit: Payer: Self-pay

## 2017-07-25 ENCOUNTER — Encounter (HOSPITAL_COMMUNITY): Payer: Self-pay

## 2017-07-25 ENCOUNTER — Emergency Department (HOSPITAL_COMMUNITY): Payer: Self-pay

## 2017-07-25 ENCOUNTER — Emergency Department (HOSPITAL_COMMUNITY)
Admission: EM | Admit: 2017-07-25 | Discharge: 2017-07-25 | Disposition: A | Discharge status: Home / Self Care | Payer: Self-pay | Attending: Emergency Medicine | Admitting: Emergency Medicine

## 2017-07-25 DIAGNOSIS — J181 Lobar pneumonia, unspecified organism: Secondary | ICD-10-CM | POA: Insufficient documentation

## 2017-07-25 DIAGNOSIS — N183 Chronic kidney disease, stage 3 (moderate): Secondary | ICD-10-CM | POA: Insufficient documentation

## 2017-07-25 DIAGNOSIS — J449 Chronic obstructive pulmonary disease, unspecified: Secondary | ICD-10-CM | POA: Insufficient documentation

## 2017-07-25 DIAGNOSIS — J189 Pneumonia, unspecified organism: Secondary | ICD-10-CM

## 2017-07-25 LAB — BASIC METABOLIC PANEL
ANION GAP: 9 (ref 5–15)
BUN: 19 mg/dL (ref 6–20)
CALCIUM: 8.7 mg/dL — AB (ref 8.9–10.3)
CO2: 23 mmol/L (ref 22–32)
Chloride: 104 mmol/L (ref 101–111)
Creatinine, Ser: 1.13 mg/dL (ref 0.61–1.24)
Glucose, Bld: 111 mg/dL — ABNORMAL HIGH (ref 65–99)
Potassium: 3.5 mmol/L (ref 3.5–5.1)
SODIUM: 136 mmol/L (ref 135–145)

## 2017-07-25 LAB — CBC
HCT: 38.7 % — ABNORMAL LOW (ref 39.0–52.0)
Hemoglobin: 13.5 g/dL (ref 13.0–17.0)
MCH: 33.5 pg (ref 26.0–34.0)
MCHC: 34.9 g/dL (ref 30.0–36.0)
MCV: 96 fL (ref 78.0–100.0)
PLATELETS: 296 10*3/uL (ref 150–400)
RBC: 4.03 MIL/uL — ABNORMAL LOW (ref 4.22–5.81)
RDW: 13.4 % (ref 11.5–15.5)
WBC: 7.3 10*3/uL (ref 4.0–10.5)

## 2017-07-25 MED ORDER — ALBUTEROL SULFATE (2.5 MG/3ML) 0.083% IN NEBU
5.0000 mg | INHALATION_SOLUTION | Freq: Once | RESPIRATORY_TRACT | Status: AC
  Administered 2017-07-25: 5 mg via RESPIRATORY_TRACT

## 2017-07-25 MED ORDER — ALBUTEROL SULFATE HFA 108 (90 BASE) MCG/ACT IN AERS
2.0000 | INHALATION_SPRAY | RESPIRATORY_TRACT | Status: DC
  Filled 2017-07-25: qty 6.7

## 2017-07-25 MED ORDER — IPRATROPIUM-ALBUTEROL 0.5-2.5 (3) MG/3ML IN SOLN: 3.0000 mL | RESPIRATORY_TRACT | 1 refills | Status: DC | PRN

## 2017-07-25 MED ORDER — IPRATROPIUM BROMIDE 0.02 % IN SOLN
0.5000 mg | Freq: Once | RESPIRATORY_TRACT | Status: AC
  Administered 2017-07-25: 0.5 mg via RESPIRATORY_TRACT
  Filled 2017-07-25: qty 2.5

## 2017-07-25 MED ORDER — ALBUTEROL SULFATE HFA 108 (90 BASE) MCG/ACT IN AERS
2.0000 | INHALATION_SPRAY | RESPIRATORY_TRACT | Status: DC
  Administered 2017-07-25: 2 via RESPIRATORY_TRACT
  Filled 2017-07-25: qty 6.7

## 2017-07-25 MED ORDER — PREDNISONE 20 MG PO TABS
60.0000 mg | ORAL_TABLET | Freq: Once | ORAL | Status: AC
  Administered 2017-07-25: 60 mg via ORAL
  Filled 2017-07-25: qty 3

## 2017-07-25 MED ORDER — ALBUTEROL SULFATE (2.5 MG/3ML) 0.083% IN NEBU
5.0000 mg | INHALATION_SOLUTION | Freq: Once | RESPIRATORY_TRACT | Status: AC
  Administered 2017-07-25: 5 mg via RESPIRATORY_TRACT
  Filled 2017-07-25: qty 6

## 2017-07-25 MED ORDER — LEVOFLOXACIN 500 MG PO TABS
500.0000 mg | ORAL_TABLET | Freq: Once | ORAL | Status: AC
  Administered 2017-07-25: 500 mg via ORAL
  Filled 2017-07-25: qty 1

## 2017-07-25 MED ORDER — PREDNISONE 20 MG PO TABS: 40.0000 mg | ORAL_TABLET | Freq: Every day | ORAL | 0 refills | Status: AC

## 2017-07-25 MED ORDER — VARENICLINE TARTRATE 0.5 MG PO TABS: 0.5000 mg | ORAL_TABLET | Freq: Two times a day (BID) | ORAL | 0 refills | Status: AC

## 2017-07-25 MED ORDER — LEVOFLOXACIN 500 MG PO TABS: 500.0000 mg | ORAL_TABLET | Freq: Every day | ORAL | 0 refills | Status: DC

## 2017-07-25 NOTE — ED Notes (Signed)
Two weeks he has had a cough and more difficulty breathing and his inhalers are not helping  Easily fatigued  Alert no distress

## 2017-07-25 NOTE — ED Triage Notes (Signed)
Pt states that for the past two weeks has been feeling sick with productive yellow sputum. Ran out of his inhaler today. Hx of asthma, EMS gave 5 mg albuterol, wheezing better now

## 2017-07-25 NOTE — ED Provider Notes (Signed)
[facility] Note   CSN: [id number] Arrival date & time: 07/25/17  0114     History   Chief Complaint Chief Complaint  Patient presents with  . Asthma    HPI Keith Weber is a 46 y.o. male.  HPI 46 year old male with a history of asthma who continues to abuse tobacco products presents the emergency department with shortness of breath worse over the past several days.  He tried his bronchodilators at home without improvement in symptoms.  He is currently not on steroids.  Reports chills without documented fever.  Reports productive cough.  Symptoms are mild to moderate in severity.  No other complaints.  No unilateral leg swelling.  No history of DVT or pulmonary embolism   Past Medical History:  Diagnosis Date  . Asthma   . COPD (chronic obstructive pulmonary disease) (HCC)   . GERD (gastroesophageal reflux disease)   . Sciatic nerve pain     Patient Active Problem List   Diagnosis Date Noted  . CAP (community acquired pneumonia) 01/21/2016  . Right middle lobe pneumonia (HCC) 01/21/2016  . Glucosuria with normal serum glucose 01/21/2016  . Lactic acidosis 01/21/2016  . CKD (chronic kidney disease) stage 3, GFR 30-59 ml/min (HCC) 01/21/2016  . Cigarette smoker 06/12/2015  . COPD pfts pending/ still smoking  06/11/2015  . Asthma, chronic 10/16/2014  . Closed fracture of right thumb 06/16/2014    Past Surgical History:  Procedure Laterality Date  . I&D EXTREMITY Right 06/16/2014   Procedure: IRRIGATION AND DEBRIDEMENT RIGHT THUMB AND FIRST FINGER AND PINNING ;  Surgeon: Dominica SeverinWilliam Gramig, MD;  [facility];  Service: Orthopedics;  Laterality: Right;  . IRRIGATION AND DEBRIDEMENT KNEE Right 06/16/2014   Procedure: IRRIGATION AND DEBRIDEMENT POSTERIOR KNEE;  Surgeon: Dominica SeverinWilliam Gramig, MD;  [facility];  Service: Orthopedics;  Laterality: Right;  . LACERATION REPAIR Right 06/16/2014   Procedure: REPAIR ODF THREE  LACERATIONS POSTERIOR KNEE;  Surgeon: Dominica SeverinWilliam Gramig, MD;  [facility];  Service: Orthopedics;  Laterality: Right;       Home Medications    Prior to Admission medications   Medication Sig Start Date End Date Taking? Authorizing Provider  albuterol (PROVENTIL HFA;VENTOLIN HFA) 108 (90 Base) MCG/ACT inhaler Inhale 2 puffs into the lungs every 6 (six) hours as needed for wheezing or shortness of breath. 07/05/17   Claiborne RiggFleming, Zelda W, NP  fluticasone furoate-vilanterol (BREO ELLIPTA) 100-25 MCG/INH AEPB Inhale 1 puff into the lungs every morning. 07/05/17   Claiborne RiggFleming, Zelda W, NP  ipratropium-albuterol (DUONEB) 0.5-2.5 (3) MG/3ML SOLN Take 3 mLs by nebulization every 6 (six) hours as needed. Patient not taking: Reported on 07/05/2017 04/14/17   Mardella LaymanHagler, Brian, MD  ipratropium-albuterol (DUONEB) 0.5-2.5 (3) MG/3ML SOLN Take 3 mLs by nebulization every 4 (four) hours as needed. 07/25/17   Azalia Bilisampos, Girl Schissler, MD  levofloxacin (LEVAQUIN) 500 MG tablet Take 1 tablet (500 mg total) by mouth daily. 07/26/17   Azalia Bilisampos, Rosana Farnell, MD  Multiple Vitamin (MULTIVITAMIN WITH MINERALS) TABS tablet Take 1 tablet by mouth daily.    [provider]  pantoprazole (PROTONIX) 40 MG tablet Take 1 tablet (40 mg total) by mouth daily. 07/05/17 10/03/17  Claiborne RiggFleming, Zelda W, NP  predniSONE (DELTASONE) 20 MG tablet Take 2 tablets (40 mg total) by mouth daily for 5 days. 07/25/17 07/30/17  Azalia Bilisampos, Kailen Name, MD  varenicline (CHANTIX) 0.5 MG tablet Take 1 tablet (0.5 mg total) by mouth 2 (two) times daily for 28 days. 0.5 mg day  1-3, then 0.5 mg BID on days 4-7, then 1mg  BID x 3 weeks 07/25/17 08/22/17  Azalia Bilisampos, Idolina Mantell, MD    Family History Family History  Family history unknown: Yes    Social History Social History   Tobacco Use  . Smoking status: Current Some Day Smoker    Packs/day: 0.00    Years: 31.00    Pack years: 0.00    Types: Cigars  . Smokeless tobacco: Never Used  Substance Use Topics  . Alcohol use: Yes     Alcohol/week: 0.6 oz    Types: 1 Cans of beer per week    Comment: occ  . Drug use: Yes    Types: Marijuana     Allergies   Aspirin; Peanut-containing drug products; Shellfish allergy; Amoxicillin; Iodine; Kiwi extract; Naproxen; and Latex   Review of Systems Review of Systems  All other systems reviewed and are negative.    Physical Exam Updated Vital Signs BP 135/78 (BP Location: Right Arm)   Pulse 77   Temp 98.6 F (37 C) (Oral)   Resp 15   SpO2 98%   Physical Exam  Constitutional: He is oriented to person, place, and time. He appears well-developed and well-nourished.  HENT:  Head: Normocephalic and atraumatic.  Eyes: EOM are normal.  Neck: Normal range of motion.  Cardiovascular: Normal rate, regular rhythm, normal heart sounds and intact distal pulses.  Pulmonary/Chest: Effort normal. No respiratory distress. He has wheezes.  Abdominal: Soft. He exhibits no distension. There is no tenderness.  Musculoskeletal: Normal range of motion.  Neurological: He is alert and oriented to person, place, and time.  Skin: Skin is warm and dry.  Psychiatric: He has a normal mood and affect. Judgment normal.  Nursing note and vitals reviewed.    ED Treatments / Results  Labs (all labs ordered are listed, but only abnormal results are displayed) Labs Reviewed  CBC - Abnormal; Notable for the following components:      Result Value   RBC 4.03 (*)    HCT 38.7 (*)    All other components within normal limits  BASIC METABOLIC PANEL - Abnormal; Notable for the following components:   Glucose, Bld 111 (*)    Calcium 8.7 (*)    All other components within normal limits    EKG  EKG Interpretation None       Radiology Dg Chest 2 View  Result Date: 07/25/2017 CLINICAL DATA:  Acute onset of cough. EXAM: CHEST  2 VIEW COMPARISON:  Chest radiograph performed 01/21/2016 FINDINGS: The lungs are well-aerated. Left basilar airspace opacity is compatible with pneumonia. There  is no evidence of pleural effusion or pneumothorax. The heart is normal in size; the mediastinal contour is within normal limits. No acute osseous abnormalities are seen. IMPRESSION: Left basilar pneumonia noted. Electronically Signed   By: Roanna RaiderJeffery  Chang M.D.   On: 07/25/2017 02:15    Procedures Procedures (including critical care time)  Medications Ordered in ED Medications  predniSONE (DELTASONE) tablet 60 mg (60 mg Oral Given 07/25/17 0247)  albuterol (PROVENTIL) (2.5 MG/3ML) 0.083% nebulizer solution 5 mg (5 mg Nebulization Given 07/25/17 0246)  ipratropium (ATROVENT) nebulizer solution 0.5 mg (0.5 mg Nebulization Given 07/25/17 0246)  levofloxacin (LEVAQUIN) tablet 500 mg (500 mg Oral Given 07/25/17 0400)  albuterol (PROVENTIL) (2.5 MG/3ML) 0.083% nebulizer solution 5 mg (5 mg Nebulization Given 07/25/17 0453)     Initial Impression / Assessment and Plan / ED Course  I have reviewed the triage vital signs and  the nursing notes.  Pertinent labs & imaging results that were available during my care of the patient were reviewed by me and considered in my medical decision making (see chart for details).     Evidence of left lower lobe pneumonia with associated asthma exacerbation.  Feels better after steroids and bronchodilators.  Home with Levaquin.  Patient understands return to the ER for new or worsening symptoms.  Final Clinical Impressions(s) / ED Diagnoses   Final diagnoses:  Community acquired pneumonia of left lower lobe of lung [facility])    ED Discharge Orders        Ordered    levofloxacin (LEVAQUIN) 500 MG tablet  Daily     07/25/17 0435    predniSONE (DELTASONE) 20 MG tablet  Daily     07/25/17 0435    varenicline (CHANTIX) 0.5 MG tablet  2 times daily     07/25/17 0438    ipratropium-albuterol (DUONEB) 0.5-2.5 (3) MG/3ML SOLN  Every 4 hours PRN     07/25/17 0449       Azalia Bilis, MD 07/25/17 [phone]

## 2017-10-05 ENCOUNTER — Ambulatory Visit: Payer: Self-pay | Admitting: Internal Medicine

## 2017-10-06 ENCOUNTER — Telehealth: Payer: Self-pay | Admitting: Nurse Practitioner

## 2017-10-06 NOTE — Telephone Encounter (Signed)
He has not been seen by me since November. He should have his inhalers which do not require the use of electricity. Unless he was on oxygen or CPAP he does not meet the criteria for me to have his electricity turned back on.

## 2017-10-06 NOTE — Telephone Encounter (Signed)
Pt called to say that he is currently at a facility that is requesting a letter to turn his electricity back on. 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 Please follow up. If approved please fax in over to [phone] attention- Tyra Please follow up

## 2017-10-06 NOTE — Telephone Encounter (Signed)
I called patient back per Dr.Jegede and told him that he was in full support of Keith Weber and he sated that he didn't want to speak with me, he wanted to speak with the Medical Directors dum ass. He said he had already got what he needed from the office.

## 2017-10-12 ENCOUNTER — Ambulatory Visit: Payer: Self-pay | Admitting: Internal Medicine

## 2017-10-21 ENCOUNTER — Ambulatory Visit: Payer: Self-pay | Admitting: Nurse Practitioner

## 2018-01-31 ENCOUNTER — Encounter (HOSPITAL_COMMUNITY): Payer: Self-pay | Admitting: Emergency Medicine

## 2018-01-31 ENCOUNTER — Ambulatory Visit (HOSPITAL_COMMUNITY)
Admission: EM | Admit: 2018-01-31 | Discharge: 2018-01-31 | Disposition: A | Discharge status: Home / Self Care | Payer: Self-pay | Attending: Family Medicine | Admitting: Family Medicine

## 2018-01-31 DIAGNOSIS — K219 Gastro-esophageal reflux disease without esophagitis: Secondary | ICD-10-CM

## 2018-01-31 DIAGNOSIS — Z760 Encounter for issue of repeat prescription: Secondary | ICD-10-CM

## 2018-01-31 DIAGNOSIS — R21 Rash and other nonspecific skin eruption: Secondary | ICD-10-CM

## 2018-01-31 DIAGNOSIS — B86 Scabies: Secondary | ICD-10-CM

## 2018-01-31 DIAGNOSIS — L237 Allergic contact dermatitis due to plants, except food: Secondary | ICD-10-CM

## 2018-01-31 DIAGNOSIS — J45909 Unspecified asthma, uncomplicated: Secondary | ICD-10-CM

## 2018-01-31 DIAGNOSIS — J441 Chronic obstructive pulmonary disease with (acute) exacerbation: Secondary | ICD-10-CM

## 2018-01-31 MED ORDER — PREDNISONE 20 MG PO TABS: ORAL_TABLET | ORAL | 0 refills | Status: DC

## 2018-01-31 MED ORDER — PANTOPRAZOLE SODIUM 40 MG PO TBEC: 40.0000 mg | DELAYED_RELEASE_TABLET | Freq: Every day | ORAL | 3 refills | Status: DC

## 2018-01-31 MED ORDER — ALBUTEROL SULFATE HFA 108 (90 BASE) MCG/ACT IN AERS: 2.0000 | INHALATION_SPRAY | Freq: Four times a day (QID) | RESPIRATORY_TRACT | 11 refills | Status: DC | PRN

## 2018-01-31 MED ORDER — IVERMECTIN 3 MG PO TABS: 12.0000 mg | ORAL_TABLET | Freq: Once | ORAL | 1 refills | Status: AC

## 2018-01-31 MED ORDER — VARENICLINE TARTRATE 0.5 MG PO TABS: 0.5000 mg | ORAL_TABLET | Freq: Two times a day (BID) | ORAL | 6 refills | Status: DC

## 2018-01-31 MED ORDER — IPRATROPIUM-ALBUTEROL 0.5-2.5 (3) MG/3ML IN SOLN: 3.0000 mL | Freq: Four times a day (QID) | RESPIRATORY_TRACT | 11 refills | Status: DC | PRN

## 2018-01-31 NOTE — ED Triage Notes (Signed)
Pt sts itchy rash to hands and needs refills on meds

## 2018-01-31 NOTE — ED Provider Notes (Signed)
[facility]   [id number] 01/31/18 Arrival Time: 1445   SUBJECTIVE:  Keith Weber is a 47 y.o. male who presents to the urgent care with complaint of itchy rash to hands and needs refills on meds (smoker with COPD)  Poison ivy on hands a little 12 days.  New lesions daily.  Interdigital and groin involvement.    Cough and wheezing without fever is constant but usually controlled by inhalers.  Needs heartburn medicine refilled.  Still smoking.  Wants chantix, too    Past Medical History:  Diagnosis Date  . Asthma   . COPD (chronic obstructive pulmonary disease) (HCC)   . GERD (gastroesophageal reflux disease)   . Sciatic nerve pain    Family History  Family history unknown: Yes   Social History   Socioeconomic History  . Marital status: Single    Spouse name: Not on file  . Number of children: Not on file  . Years of education: Not on file  . Highest education level: Not on file  Occupational History  . Not on file  Social Needs  . Financial resource strain: Not on file  . Food insecurity:    Worry: Not on file    Inability: Not on file  . Transportation needs:    Medical: Not on file    Non-medical: Not on file  Tobacco Use  . Smoking status: Current Some Day Smoker    Packs/day: 0.00    Years: 31.00    Pack years: 0.00    Types: Cigars  . Smokeless tobacco: Never Used  Substance and Sexual Activity  . Alcohol use: Yes    Alcohol/week: 0.6 oz    Types: 1 Cans of beer per week    Comment: occ  . Drug use: Yes    Types: Marijuana  . Sexual activity: Not on file  Lifestyle  . Physical activity:    Days per week: Not on file    Minutes per session: Not on file  . Stress: Not on file  Relationships  . Social connections:    Talks on phone: Not on file    Gets together: Not on file    Attends religious service: Not on file    Active member of club or organization: Not on file    Attends meetings of clubs or organizations: Not on file   Relationship status: Not on file  . Intimate partner violence:    Fear of current or ex partner: Not on file    Emotionally abused: Not on file    Physically abused: Not on file    Forced sexual activity: Not on file  Other Topics Concern  . Not on file  Social History Narrative  . Not on file   No outpatient medications have been marked as taking for the 01/31/18 encounter [facility] Encounter).   Allergies  Allergen Reactions  . Aspirin Anaphylaxis  . Peanut-Containing Drug Products Anaphylaxis  . Shellfish Allergy Anaphylaxis and Shortness Of Breath  . Amoxicillin Hives    Has patient had a PCN reaction causing immediate rash, facial/tongue/throat swelling, SOB or lightheadedness with hypotension:  Has patient had a PCN reaction causing severe rash involving mucus membranes or skin necrosis:  Has patient had a PCN reaction that required hospitalization Yes Has patient had a PCN reaction occurring within the last 10 years: If all of the above answers are "NO", then may proceed with Cephalosporin use.   . Iodine Nausea And Vomiting and Swelling  . Kiwi Extract  Swelling  . Naproxen Hives  . Latex Itching and Rash      ROS: As per HPI, remainder of ROS negative.   OBJECTIVE:   Vitals:   01/31/18 1516  BP: 133/82  Pulse: 71  Resp: 18  Temp: 98.4 F (36.9 C)  TempSrc: Oral  SpO2: 100%     General appearance: alert; no distress Eyes: PERRL; EOMI; conjunctiva normal HENT: normocephalic; atraumatic; TMs normal, canal normal, external ears normal without trauma; nasal mucosa normal; oral mucosa early leukoplakia Neck: supple Lungs: wheezes to auscultation bilaterally Heart: regular rate and rhythm Abdomen: soft, non-tender; bowel sounds normal; no masses or organomegaly; no guarding or rebound tenderness Back: no CVA tenderness Extremities: no cyanosis or edema; symmetrical with no gross deformities Skin: warm and dry    Neurologic: normal gait; grossly  normal Psychological: alert and cooperative; normal mood and affect      Labs:  Results for orders placed or performed during the hospital encounter of 07/25/17  CBC  Result Value Ref Range   WBC 7.3 4.0 - 10.5 K/uL   RBC 4.03 (L) 4.22 - 5.81 MIL/uL   Hemoglobin 13.5 13.0 - 17.0 g/dL   HCT 28.4 (L) 13.2 - 44.0 %   MCV 96.0 78.0 - 100.0 fL   MCH 33.5 26.0 - 34.0 pg   MCHC 34.9 30.0 - 36.0 g/dL   RDW 10.2 72.5 - 36.6 %   Platelets 296 150 - 400 K/uL  Basic metabolic panel  Result Value Ref Range   Sodium 136 135 - 145 mmol/L   Potassium 3.5 3.5 - 5.1 mmol/L   Chloride 104 101 - 111 mmol/L   CO2 23 22 - 32 mmol/L   Glucose, Bld 111 (H) 65 - 99 mg/dL   BUN 19 6 - 20 mg/dL   Creatinine, Ser 4.40 0.61 - 1.24 mg/dL   Calcium 8.7 (L) 8.9 - 10.3 mg/dL   GFR calc non Af Amer >60 >60 mL/min   GFR calc Af Amer >60 >60 mL/min   Anion gap 9 5 - 15    Labs Reviewed - No data to display  No results found.     ASSESSMENT & PLAN:  1. Scabies   2. Asthma, unspecified asthma severity, unspecified whether complicated, unspecified whether persistent   3. COPD exacerbation (HCC)   4. Gastroesophageal reflux disease, esophagitis presence not specified   5. Poison ivy     Meds ordered this encounter  Medications  . albuterol (PROVENTIL HFA;VENTOLIN HFA) 108 (90 Base) MCG/ACT inhaler    Sig: Inhale 2 puffs into the lungs every 6 (six) hours as needed for wheezing or shortness of breath.    Dispense:  1 Inhaler    Refill:  11  . ipratropium-albuterol (DUONEB) 0.5-2.5 (3) MG/3ML SOLN    Sig: Take 3 mLs by nebulization every 6 (six) hours as needed.    Dispense:  360 mL    Refill:  11  . predniSONE (DELTASONE) 20 MG tablet    Sig: Two daily with food    Dispense:  10 tablet    Refill:  0  . varenicline (CHANTIX) 0.5 MG tablet    Sig: Take 1 tablet (0.5 mg total) by mouth 2 (two) times daily.    Dispense:  60 tablet    Refill:  6  . pantoprazole (PROTONIX) 40 MG tablet     Sig: Take 1 tablet (40 mg total) by mouth daily.    Dispense:  90 tablet    Refill:  3  . ivermectin (STROMECTOL) 3 MG TABS tablet    Sig: Take 4 tablets (12 mg total) by mouth once for 1 dose.    Dispense:  4 tablet    Refill:  1    Reviewed expectations re: course of current medical issues. Questions answered. Outlined signs and symptoms indicating need for more acute intervention. Patient verbalized understanding. After Visit Summary given.    Procedures:      Elvina SidleLauenstein, Muriel Wilber, MD 01/31/18 [id number]

## 2018-02-01 MED FILL — predniSONE 20 MG TABS: 20 | 5 days supply | Qty: 10 | Fill #0

## 2018-02-01 MED FILL — IVERMECTIN 3 MG TABLET: 3 | 1 days supply | Qty: 4 | Fill #0

## 2018-02-01 MED FILL — PANTOPRAZOLE SOD DR 40 MG T: 40 | 30 days supply | Qty: 30 | Fill #0

## 2018-02-01 MED FILL — !VENTOLIN HFA INHALER: 108 (90 BAS | 25 days supply | Qty: 18 | Fill #0

## 2018-02-01 MED FILL — !CHANTIX CONT MONTH BOX: 1 | 28 days supply | Qty: 56 | Fill #0

## 2018-03-03 MED FILL — IPRAT-ALBUT 0.5-3(2.5) MG/3: 0.5-2.5 (3) | 30 days supply | Qty: 360 | Fill #0

## 2018-03-03 MED FILL — !VENTOLIN HFA INHALER: 108 (90 BAS | 25 days supply | Qty: 18 | Fill #1

## 2018-03-03 MED FILL — IVERMECTIN 3 MG TABLET: 3 | 1 days supply | Qty: 4 | Fill #1

## 2018-03-21 ENCOUNTER — Ambulatory Visit: Payer: Self-pay

## 2018-04-06 ENCOUNTER — Ambulatory Visit: Payer: Self-pay

## 2018-04-17 ENCOUNTER — Encounter (HOSPITAL_COMMUNITY): Payer: Self-pay | Admitting: Emergency Medicine

## 2018-04-17 ENCOUNTER — Emergency Department (HOSPITAL_COMMUNITY)
Admission: EM | Admit: 2018-04-17 | Discharge: 2018-04-17 | Disposition: A | Discharge status: Home / Self Care | Payer: Self-pay | Attending: Emergency Medicine | Admitting: Emergency Medicine

## 2018-04-17 DIAGNOSIS — J449 Chronic obstructive pulmonary disease, unspecified: Secondary | ICD-10-CM | POA: Insufficient documentation

## 2018-04-17 DIAGNOSIS — K047 Periapical abscess without sinus: Secondary | ICD-10-CM | POA: Insufficient documentation

## 2018-04-17 DIAGNOSIS — K0889 Other specified disorders of teeth and supporting structures: Secondary | ICD-10-CM

## 2018-04-17 DIAGNOSIS — N183 Chronic kidney disease, stage 3 (moderate): Secondary | ICD-10-CM | POA: Insufficient documentation

## 2018-04-17 DIAGNOSIS — Z79899 Other long term (current) drug therapy: Secondary | ICD-10-CM | POA: Insufficient documentation

## 2018-04-17 DIAGNOSIS — Z91010 Allergy to peanuts: Secondary | ICD-10-CM | POA: Insufficient documentation

## 2018-04-17 DIAGNOSIS — F17210 Nicotine dependence, cigarettes, uncomplicated: Secondary | ICD-10-CM | POA: Insufficient documentation

## 2018-04-17 MED ORDER — DOXYCYCLINE HYCLATE 100 MG PO CAPS: 100.0000 mg | ORAL_CAPSULE | Freq: Two times a day (BID) | ORAL | 0 refills | Status: AC

## 2018-04-17 MED ORDER — OXYCODONE-ACETAMINOPHEN 5-325 MG PO TABS
1.0000 | ORAL_TABLET | Freq: Once | ORAL | Status: AC
  Administered 2018-04-17: 1 via ORAL
  Filled 2018-04-17: qty 1

## 2018-04-17 MED ORDER — DOXYCYCLINE HYCLATE 100 MG PO CAPS: 100.0000 mg | ORAL_CAPSULE | Freq: Two times a day (BID) | ORAL | 0 refills | Status: DC

## 2018-04-17 NOTE — Discharge Instructions (Signed)
I have provided antibiotics for your infection.  Please continue to take ibuprofen or Tylenol for your pain.If you experience any fever, pain with swallowing please return to the ED for reevaluation.

## 2018-04-17 NOTE — ED Notes (Signed)
Bed: WTR5 Expected date:  Expected time:  Means of arrival:  Comments: 

## 2018-04-17 NOTE — ED Triage Notes (Signed)
Pt c/o upper right dental pain. Pain is constant and no relief with OTC meds.

## 2018-04-17 NOTE — ED Provider Notes (Signed)
[facility]-EMERGENCY DEPT Provider Note   CSN: [id number] Arrival date & time: 04/17/18  0802     History   Chief Complaint Chief Complaint  Patient presents with  . Dental Pain    HPI Keith Weber is a 47 y.o. male.  47 y.o male smoker with no PMH presents to the ED with a chief complaint of dental pain x 1 week. He reports pain to the right side of his mouth worse with mastication. He has tried [facility] powder, oral gel and motrin but states no relieve in symptoms.Patient reports he last saw a dentist a few years ago. He denies any fever, shortness of breath or odynophagia.      Past Medical History:  Diagnosis Date  . Asthma   . COPD (chronic obstructive pulmonary disease) (HCC)   . GERD (gastroesophageal reflux disease)   . Sciatic nerve pain     Patient Active Problem List   Diagnosis Date Noted  . CAP (community acquired pneumonia) 01/21/2016  . Right middle lobe pneumonia (HCC) 01/21/2016  . Glucosuria with normal serum glucose 01/21/2016  . Lactic acidosis 01/21/2016  . CKD (chronic kidney disease) stage 3, GFR 30-59 ml/min (HCC) 01/21/2016  . Cigarette smoker 06/12/2015  . COPD pfts pending/ still smoking  06/11/2015  . Asthma, chronic 10/16/2014  . Closed fracture of right thumb 06/16/2014    Past Surgical History:  Procedure Laterality Date  . I&D EXTREMITY Right 06/16/2014   Procedure: IRRIGATION AND DEBRIDEMENT RIGHT THUMB AND FIRST FINGER AND PINNING ;  Surgeon: Dominica Severin, MD;  Location: [facility] OR;  Service: Orthopedics;  Laterality: Right;  . IRRIGATION AND DEBRIDEMENT KNEE Right 06/16/2014   Procedure: IRRIGATION AND DEBRIDEMENT POSTERIOR KNEE;  Surgeon: Dominica Severin, MD;  Location: [facility] OR;  Service: Orthopedics;  Laterality: Right;  . LACERATION REPAIR Right 06/16/2014   Procedure: REPAIR ODF THREE LACERATIONS POSTERIOR KNEE;  Surgeon: Dominica Severin, MD;  Location: [facility] OR;  Service: Orthopedics;  Laterality: Right;        Home  Medications    Prior to Admission medications   Medication Sig Start Date End Date Taking? Authorizing Provider  albuterol (PROVENTIL HFA;VENTOLIN HFA) 108 (90 Base) MCG/ACT inhaler Inhale 2 puffs into the lungs every 6 (six) hours as needed for wheezing or shortness of breath. 01/31/18   Elvina Sidle, MD  doxycycline (VIBRAMYCIN) 100 MG capsule Take 1 capsule (100 mg total) by mouth 2 (two) times daily for 7 days. 04/17/18 04/24/18  Claude Manges, PA-C  fluticasone furoate-vilanterol (BREO ELLIPTA) 100-25 MCG/INH AEPB Inhale 1 puff into the lungs every morning. 07/05/17   Claiborne Rigg, NP  ipratropium-albuterol (DUONEB) 0.5-2.5 (3) MG/3ML SOLN Take 3 mLs by nebulization every 6 (six) hours as needed. 01/31/18   Elvina Sidle, MD  Multiple Vitamin (MULTIVITAMIN WITH MINERALS) TABS tablet Take 1 tablet by mouth daily.    [provider]  pantoprazole (PROTONIX) 40 MG tablet Take 1 tablet (40 mg total) by mouth daily. 01/31/18 05/01/18  Elvina Sidle, MD  predniSONE (DELTASONE) 20 MG tablet Two daily with food 01/31/18   Elvina Sidle, MD  varenicline (CHANTIX) 0.5 MG tablet Take 1 tablet (0.5 mg total) by mouth 2 (two) times daily. 01/31/18   Elvina Sidle, MD    Family History Family History  Family history unknown: Yes    Social History Social History   Tobacco Use  . Smoking status: Current Some Day Smoker    Packs/day: 0.00    Years: 31.00  Pack years: 0.00    Types: Cigars  . Smokeless tobacco: Never Used  Substance Use Topics  . Alcohol use: Yes    Alcohol/week: 1.0 standard drinks    Types: 1 Cans of beer per week    Comment: occ  . Drug use: Yes    Types: Marijuana     Allergies   Aspirin; Peanut-containing drug products; Shellfish allergy; Amoxicillin; Iodine; Kiwi extract; Naproxen; and Latex   Review of Systems Review of Systems  Constitutional: Negative for fever.  HENT: Positive for dental problem. Negative for trouble swallowing.   All  other systems reviewed and are negative.    Physical Exam Updated Vital Signs BP (!) 159/104   Pulse 84   Temp 98.8 F (37.1 C) (Oral)   Resp 18   Ht 5\' 11"  (1.803 m)   Wt 79.4 kg   SpO2 94%   BMI 24.41 kg/m   Physical Exam  Constitutional: He is oriented to person, place, and time. He appears well-developed and well-nourished.  HENT:  Head: Normocephalic and atraumatic.  Mouth/Throat: Uvula is midline and oropharynx is clear and moist. Abnormal dentition. Dental abscesses and dental caries present. No posterior oropharyngeal edema or posterior oropharyngeal erythema.  There is missing teeth throughout whole mouth. Patient reports pain where a tooth is cracked in half   Eyes: Pupils are equal, round, and reactive to light.  Neck: Normal range of motion. Neck supple.  Cardiovascular: Normal heart sounds.  Pulmonary/Chest: Breath sounds normal.  Abdominal: Soft.  Musculoskeletal: He exhibits no tenderness.  Neurological: He is alert and oriented to person, place, and time.  Skin: Skin is warm and dry.  Nursing note and vitals reviewed.    ED Treatments / Results  Labs (all labs ordered are listed, but only abnormal results are displayed) Labs Reviewed - No data to display  EKG None  Radiology No results found.  Procedures Procedures (including critical care time)  Medications Ordered in ED Medications  oxyCODONE-acetaminophen (PERCOCET/ROXICET) 5-325 MG per tablet 1 tablet (1 tablet Oral Given 04/17/18 0853)     Initial Impression / Assessment and Plan / ED Course  I have reviewed the triage vital signs and the nursing notes.  Pertinent labs & imaging results that were available during my care of the patient were reviewed by me and considered in my medical decision making (see chart for details).     He presents with dental pain x couple days ago.  Patient has tried Orajel, Motrin, BC powder but states no relieving symptoms.  He denies any fever  Upon  examination there are multiple teeth missing in the front of his mouth where he is expressing pain there is a small periapical abscess where a tooth has chipped off, I do not see any bone exposed at this time.  I have given patient Percocet for pain while in the ED and will discharge him home with antibiotic therapy for his infection.  I will also provide him a referral to the [facility] dental school in order to seek help for his dental complaints.  Understands with plan, patient stable for discharge.  Final Clinical Impressions(s) / ED Diagnoses   Final diagnoses:  Pain, dental  Dental abscess    ED Discharge Orders         Ordered    doxycycline (VIBRAMYCIN) 100 MG capsule  2 times daily     04/17/18 0927           Claude Manges, PA-C 04/17/18 [phone]  Alvira Monday, MD 04/18/18 [phone]

## 2018-09-16 MED FILL — IPRAT-ALBUT 0.5-3(2.5) MG/3: 0.5-2.5 (3) | 30 days supply | Qty: 360 | Fill #1

## 2018-09-16 MED FILL — !VENTOLIN HFA INHALER: 108 (90 BAS | 25 days supply | Qty: 18 | Fill #2

## 2018-10-26 MED FILL — ALBUTEROL SULFATE HFA 108 (: 108 (90 BAS | 24 days supply | Qty: 18 | Fill #3

## 2018-12-12 ENCOUNTER — Emergency Department (HOSPITAL_BASED_OUTPATIENT_CLINIC_OR_DEPARTMENT_OTHER)
Admission: EM | Admit: 2018-12-12 | Discharge: 2018-12-12 | Disposition: A | Discharge status: Home / Self Care | Payer: HRSA Program | Attending: Emergency Medicine | Admitting: Emergency Medicine

## 2018-12-12 ENCOUNTER — Other Ambulatory Visit: Payer: Self-pay

## 2018-12-12 ENCOUNTER — Encounter (HOSPITAL_BASED_OUTPATIENT_CLINIC_OR_DEPARTMENT_OTHER): Payer: Self-pay | Admitting: *Deleted

## 2018-12-12 ENCOUNTER — Emergency Department (HOSPITAL_BASED_OUTPATIENT_CLINIC_OR_DEPARTMENT_OTHER): Payer: HRSA Program

## 2018-12-12 DIAGNOSIS — F17210 Nicotine dependence, cigarettes, uncomplicated: Secondary | ICD-10-CM | POA: Insufficient documentation

## 2018-12-12 DIAGNOSIS — J45901 Unspecified asthma with (acute) exacerbation: Secondary | ICD-10-CM | POA: Insufficient documentation

## 2018-12-12 DIAGNOSIS — N183 Chronic kidney disease, stage 3 (moderate): Secondary | ICD-10-CM | POA: Diagnosis not present

## 2018-12-12 DIAGNOSIS — Z79899 Other long term (current) drug therapy: Secondary | ICD-10-CM | POA: Insufficient documentation

## 2018-12-12 DIAGNOSIS — Z20822 Contact with and (suspected) exposure to covid-19: Secondary | ICD-10-CM

## 2018-12-12 DIAGNOSIS — Z20828 Contact with and (suspected) exposure to other viral communicable diseases: Secondary | ICD-10-CM | POA: Diagnosis not present

## 2018-12-12 DIAGNOSIS — R1084 Generalized abdominal pain: Secondary | ICD-10-CM | POA: Diagnosis not present

## 2018-12-12 DIAGNOSIS — R079 Chest pain, unspecified: Secondary | ICD-10-CM | POA: Diagnosis present

## 2018-12-12 DIAGNOSIS — R05 Cough: Secondary | ICD-10-CM | POA: Insufficient documentation

## 2018-12-12 DIAGNOSIS — R6889 Other general symptoms and signs: Secondary | ICD-10-CM

## 2018-12-12 DIAGNOSIS — R197 Diarrhea, unspecified: Secondary | ICD-10-CM | POA: Insufficient documentation

## 2018-12-12 LAB — CBC WITH DIFFERENTIAL/PLATELET
Abs Immature Granulocytes: 0.02 10*3/uL (ref 0.00–0.07)
Basophils Absolute: 0.1 10*3/uL (ref 0.0–0.1)
Basophils Relative: 1 %
Eosinophils Absolute: 0.4 10*3/uL (ref 0.0–0.5)
Eosinophils Relative: 4 %
HCT: 45.4 % (ref 39.0–52.0)
Hemoglobin: 15.4 g/dL (ref 13.0–17.0)
Immature Granulocytes: 0 %
Lymphocytes Relative: 18 %
Lymphs Abs: 1.7 10*3/uL (ref 0.7–4.0)
MCH: 33 pg (ref 26.0–34.0)
MCHC: 33.9 g/dL (ref 30.0–36.0)
MCV: 97.4 fL (ref 80.0–100.0)
Monocytes Absolute: 1.1 10*3/uL — ABNORMAL HIGH (ref 0.1–1.0)
Monocytes Relative: 11 %
Neutro Abs: 6.4 10*3/uL (ref 1.7–7.7)
Neutrophils Relative %: 66 %
Platelets: 253 10*3/uL (ref 150–400)
RBC: 4.66 MIL/uL (ref 4.22–5.81)
RDW: 13.9 % (ref 11.5–15.5)
WBC: 9.7 10*3/uL (ref 4.0–10.5)
nRBC: 0 % (ref 0.0–0.2)

## 2018-12-12 LAB — COMPREHENSIVE METABOLIC PANEL
ALT: 15 U/L (ref 0–44)
AST: 24 U/L (ref 15–41)
Albumin: 3.8 g/dL (ref 3.5–5.0)
Alkaline Phosphatase: 95 U/L (ref 38–126)
Anion gap: 6 (ref 5–15)
BUN: 19 mg/dL (ref 6–20)
CO2: 27 mmol/L (ref 22–32)
Calcium: 9 mg/dL (ref 8.9–10.3)
Chloride: 105 mmol/L (ref 98–111)
Creatinine, Ser: 1.62 mg/dL — ABNORMAL HIGH (ref 0.61–1.24)
GFR calc Af Amer: 57 mL/min — ABNORMAL LOW (ref >60)
GFR calc non Af Amer: 49 mL/min — ABNORMAL LOW (ref >60)
Glucose, Bld: 121 mg/dL — ABNORMAL HIGH (ref 70–99)
Potassium: 4.1 mmol/L (ref 3.5–5.1)
Sodium: 138 mmol/L (ref 135–145)
Total Bilirubin: 0.6 mg/dL (ref 0.3–1.2)
Total Protein: 7.6 g/dL (ref 6.5–8.1)

## 2018-12-12 LAB — SARS CORONAVIRUS 2 AG (30 MIN TAT): SARS Coronavirus 2 Ag: NEGATIVE

## 2018-12-12 LAB — LACTIC ACID, PLASMA: Lactic Acid, Venous: 1.1 mmol/L (ref 0.5–1.9)

## 2018-12-12 LAB — TROPONIN I: Troponin I: <0.03 ng/mL (ref <0.03)

## 2018-12-12 MED ORDER — METHYLPREDNISOLONE SODIUM SUCC 125 MG IJ SOLR
125.0000 mg | Freq: Once | INTRAMUSCULAR | Status: AC
  Administered 2018-12-12: 15:00:00 125 mg via INTRAVENOUS
  Filled 2018-12-12: qty 2

## 2018-12-12 MED ORDER — PREDNISONE 20 MG PO TABS: 60.0000 mg | ORAL_TABLET | Freq: Every day | ORAL | 0 refills | Status: AC

## 2018-12-12 MED ORDER — IPRATROPIUM BROMIDE HFA 17 MCG/ACT IN AERS
2.0000 | INHALATION_SPRAY | Freq: Once | RESPIRATORY_TRACT | Status: AC
  Administered 2018-12-12: 17:00:00 2 via RESPIRATORY_TRACT
  Filled 2018-12-12: qty 12.9

## 2018-12-12 MED ORDER — ALBUTEROL SULFATE HFA 108 (90 BASE) MCG/ACT IN AERS
4.0000 | INHALATION_SPRAY | Freq: Once | RESPIRATORY_TRACT | Status: AC
  Administered 2018-12-12: 17:00:00 4 via RESPIRATORY_TRACT

## 2018-12-12 MED ORDER — ALBUTEROL SULFATE HFA 108 (90 BASE) MCG/ACT IN AERS
4.0000 | INHALATION_SPRAY | Freq: Once | RESPIRATORY_TRACT | Status: AC
Start: 2018-12-12 — End: 2018-12-12
  Administered 2018-12-12: 4 via RESPIRATORY_TRACT
  Filled 2018-12-12: qty 6.7

## 2018-12-12 MED ORDER — AEROCHAMBER PLUS FLO-VU MISC
1.0000 | Freq: Once | Status: AC
  Administered 2018-12-12: 1
  Filled 2018-12-12: qty 1

## 2018-12-12 NOTE — ED Provider Notes (Signed)
[facility] EMERGENCY DEPARTMENT Provider Note   CSN: [id number] Arrival date & time: 12/12/18  1348    History   Chief Complaint Chief Complaint  Patient presents with  . Chest Pain    HPI Keith Weber is a 48 y.o. male.     Keith Weber is a 48 y.o. male with a history of COPD, asthma, GERD and sciatic nerve pain, who presents to the emergency department for evaluation of chest pain and shortness of breath.  He reports that for the past 3 to 4 days he is felt fatigued and rundown he has had chills and night sweats.  He reports cough with associated shortness of breath, wheezing and chest tightness.  He reports it feels like his asthma is acting up but it is not responding to his home albuterol as it typically does.  He reports some intermittent chest pains that are worse with cough and deep breathing.  He also reports some abdominal cramping with multiple loose bowel movements, no blood.  No nausea or vomiting.  He has not taken his temperature at home but has had night sweats frequently with chills.  He is unsure of any specific sick contacts but has been in and out of stores to run errands and pick up essentials recently.  He reports history of previous pneumonia.     Past Medical History:  Diagnosis Date  . Asthma   . COPD (chronic obstructive pulmonary disease) (HCC)   . GERD (gastroesophageal reflux disease)   . Sciatic nerve pain     Patient Active Problem List   Diagnosis Date Noted  . CAP (community acquired pneumonia) 01/21/2016  . Right middle lobe pneumonia (HCC) 01/21/2016  . Glucosuria with normal serum glucose 01/21/2016  . Lactic acidosis 01/21/2016  . CKD (chronic kidney disease) stage 3, GFR 30-59 ml/min (HCC) 01/21/2016  . Cigarette smoker 06/12/2015  . COPD pfts pending/ still smoking  06/11/2015  . Asthma, chronic 10/16/2014  . Closed fracture of right thumb 06/16/2014    Past Surgical History:  Procedure Laterality Date  . I&D  EXTREMITY Right 06/16/2014   Procedure: IRRIGATION AND DEBRIDEMENT RIGHT THUMB AND FIRST FINGER AND PINNING ;  Surgeon: Dominica Severin, MD;  Location: [facility] OR;  Service: Orthopedics;  Laterality: Right;  . IRRIGATION AND DEBRIDEMENT KNEE Right 06/16/2014   Procedure: IRRIGATION AND DEBRIDEMENT POSTERIOR KNEE;  Surgeon: Dominica Severin, MD;  Location: [facility] OR;  Service: Orthopedics;  Laterality: Right;  . LACERATION REPAIR Right 06/16/2014   Procedure: REPAIR ODF THREE LACERATIONS POSTERIOR KNEE;  Surgeon: Dominica Severin, MD;  Location: [facility] OR;  Service: Orthopedics;  Laterality: Right;        Home Medications    Prior to Admission medications   Medication Sig Start Date End Date Taking? Authorizing Provider  albuterol (PROVENTIL HFA;VENTOLIN HFA) 108 (90 Base) MCG/ACT inhaler Inhale 2 puffs into the lungs every 6 (six) hours as needed for wheezing or shortness of breath. 01/31/18   Elvina Sidle, MD  fluticasone furoate-vilanterol (BREO ELLIPTA) 100-25 MCG/INH AEPB Inhale 1 puff into the lungs every morning. 07/05/17   Claiborne Rigg, NP  ipratropium-albuterol (DUONEB) 0.5-2.5 (3) MG/3ML SOLN Take 3 mLs by nebulization every 6 (six) hours as needed. 01/31/18   Elvina Sidle, MD  Multiple Vitamin (MULTIVITAMIN WITH MINERALS) TABS tablet Take 1 tablet by mouth daily.    [provider]  pantoprazole (PROTONIX) 40 MG tablet Take 1 tablet (40 mg total) by mouth daily. 01/31/18 05/01/18  Elvina Sidle, MD  predniSONE (DELTASONE) 20 MG tablet Take 3 tablets (60 mg total) by mouth daily for 5 days. 12/12/18 12/17/18  Dartha LodgeFord, Tavian Callander N, PA-C  varenicline (CHANTIX) 0.5 MG tablet Take 1 tablet (0.5 mg total) by mouth 2 (two) times daily. 01/31/18   Elvina SidleLauenstein, Kurt, MD    Family History Family History  Family history unknown: Yes    Social History Social History   Tobacco Use  . Smoking status: Current Some Day Smoker    Packs/day: 0.00    Years: 31.00    Pack years: 0.00    Types: Cigars   . Smokeless tobacco: Never Used  Substance Use Topics  . Alcohol use: Yes    Alcohol/week: 1.0 standard drinks    Types: 1 Cans of beer per week    Comment: occ  . Drug use: Yes    Types: Marijuana     Allergies   Aspirin; Peanut-containing drug products; Shellfish allergy; Amoxicillin; Iodine; Kiwi extract; Naproxen; and Latex   Review of Systems Review of Systems  Constitutional: Positive for chills. Negative for fever.  HENT: Negative for congestion, rhinorrhea and sore throat.   Eyes: Negative for visual disturbance.  Respiratory: Positive for cough, chest tightness, shortness of breath and wheezing.   Gastrointestinal: Positive for abdominal pain and diarrhea. Negative for constipation, nausea and vomiting.  Genitourinary: Negative for dysuria and frequency.  Musculoskeletal: Positive for myalgias. Negative for arthralgias, back pain and neck pain.  Skin: Negative for color change and rash.  Neurological: Negative for dizziness, syncope and light-headedness.     Physical Exam Updated Vital Signs BP (!) 153/96 (BP Location: Right Arm)   Pulse 100   Temp 99.5 F (37.5 C) (Oral)   Resp 18   Ht 5\' 11"  (1.803 m)   Wt 77.1 kg   SpO2 95%   BMI 23.71 kg/m   Physical Exam Vitals signs and nursing note reviewed.  Constitutional:      General: He is not in acute distress.    Appearance: He is well-developed and normal weight. He is not diaphoretic.  HENT:     Head: Normocephalic and atraumatic.     Mouth/Throat:     Mouth: Mucous membranes are moist.     Comments: Posterior oropharynx clear and mucous membranes moist, there is mild erythema but no edema or tonsillar exudates, uvula midline, normal phonation, no trismus, tolerating secretions without difficulty. Eyes:     General:        Right eye: No discharge.        Left eye: No discharge.     Pupils: Pupils are equal, round, and reactive to light.  Neck:     Musculoskeletal: Neck supple.  Cardiovascular:      Rate and Rhythm: Normal rate and regular rhythm.     Pulses:          Radial pulses are 2+ on the right side and 2+ on the left side.     Heart sounds: Normal heart sounds. No murmur. No friction rub. No gallop.   Pulmonary:     Effort: Pulmonary effort is normal. No respiratory distress.     Breath sounds: Wheezing present. No rales.     Comments: Patient is not tachypneic and is satting at 95% on room air on arrival but does have coarse wheezing throughout bilateral lung fields. Chest:     Chest wall: No tenderness.  Abdominal:     General: Bowel sounds are normal. There is no distension.  Palpations: Abdomen is soft. There is no mass.     Tenderness: There is no abdominal tenderness. There is no guarding.     Comments: Abdomen soft, nondistended, nontender to palpation in all quadrants without guarding or peritoneal signs  Musculoskeletal:        General: No deformity.     Right lower leg: No edema.     Left lower leg: No edema.     Comments: No lower extremity edema bilaterally.  Skin:    General: Skin is warm and dry.     Capillary Refill: Capillary refill takes less than 2 seconds.  Neurological:     Mental Status: He is alert and oriented to person, place, and time.     Coordination: Coordination normal.     Comments: Speech is clear, able to follow commands Moves extremities without ataxia, coordination intact   Psychiatric:        Mood and Affect: Mood normal.        Behavior: Behavior normal.      ED Treatments / Results  Labs (all labs ordered are listed, but only abnormal results are displayed) Labs Reviewed  COMPREHENSIVE METABOLIC PANEL - Abnormal; Notable for the following components:      Result Value   Glucose, Bld 121 (*)    Creatinine, Ser 1.62 (*)    GFR calc non Af Amer 49 (*)    GFR calc Af Amer 57 (*)    All other components within normal limits  CBC WITH DIFFERENTIAL/PLATELET - Abnormal; Notable for the following components:   Monocytes  Absolute 1.1 (*)    All other components within normal limits  SARS CORONAVIRUS 2 (HOSP ORDER, PERFORMED IN [facility] VIA ABBOTT ID)  TROPONIN I  LACTIC ACID, PLASMA    EKG EKG Interpretation  Date/Time:  Monday Dec 12 2018 14:10:25 EDT Ventricular Rate:  94 PR Interval:  132 QRS Duration: 82 QT Interval:  330 QTC Calculation: 412 R Axis:   92 Text Interpretation:  Normal sinus rhythm Rightward axis Borderline ECG rate slower but otherwise similar to previous Confirmed by Frederick Peers [phone]) on 12/12/2018 2:32:55 PM   Radiology Dg Chest Port 1 View  Result Date: 12/12/2018 CLINICAL DATA:  48 year old male with diarrhea, night sweats, fatigue and chest pain EXAM: PORTABLE CHEST 1 VIEW COMPARISON:  Prior chest x-ray 07/25/2017 FINDINGS: The lungs are clear and negative for focal airspace consolidation, pulmonary edema or suspicious pulmonary nodule. No pleural effusion or pneumothorax. Cardiac and mediastinal contours are within normal limits. No acute fracture or lytic or blastic osseous lesions. The visualized upper abdominal bowel gas pattern is unremarkable. IMPRESSION: Negative chest x-ray. Electronically Signed   By: Malachy Moan M.D.   On: 12/12/2018 15:57    Procedures Procedures (including critical care time)  Medications Ordered in ED Medications  albuterol (VENTOLIN HFA) 108 (90 Base) MCG/ACT inhaler 4 puff (4 puffs Inhalation Given 12/12/18 1525)  aerochamber plus with mask device 1 each (1 each Other Given 12/12/18 1525)  methylPREDNISolone sodium succinate (SOLU-MEDROL) 125 mg/2 mL injection 125 mg (125 mg Intravenous Given 12/12/18 1519)  albuterol (VENTOLIN HFA) 108 (90 Base) MCG/ACT inhaler 4 puff (4 puffs Inhalation Given 12/12/18 1706)  ipratropium (ATROVENT HFA) inhaler 2 puff (2 puffs Inhalation Given 12/12/18 1727)     Initial Impression / Assessment and Plan / ED Course  I have reviewed the triage vital signs and the nursing notes.  Pertinent labs &  imaging results that were available during my  care of the patient were reviewed by me and considered in my medical decision making (see chart for details).  Patient presents with several days of cough, congestion, wheezing, chest tightness and shortness of breath.  He reports chills and sweats at home and has a low-grade fever on arrival, vitals otherwise stable.  Patient satting at 95% on room air with no increased work of breathing but does have coarse wheezing throughout bilateral lungs with some decreased air movement.  Presentation concerning for possible COVID-19 infection patient is also had some decreased sense of smell and taste and has had some cramping abdominal pain with diarrhea.  Unsure of any sick contacts.  Will check basic labs, troponin given persistent shortness of breath and patient's previous cardiac history, patient does not appear fluid overloaded, and I have low suspicion for PE, we will not check d-dimer.  Given patient's history of pneumonia we will check lactic acid and chest x-ray.  COVID-19 test sent.  Patient's EKG without concerning changes.  Will treat symptoms with steroids, albuterol, Atrovent.  Patient's labs are overall reassuring, no leukocytosis, normal hemoglobin, differential is unremarkable.  Slight increase in creatinine of 1.62 but no other significant electrolyte derangements, normal renal and liver function.  Negative troponin.  Lactic acid is not elevated.  COVID 19 test is negative and chest x-ray is clear with no evidence of atypical pneumonia.  Patient was ambulated in the room and maintained oxygen saturations of 96-98% while walking at a brisk pace with no significant tachycardia or increased work of breathing which is very reassuring.  After multiple breathing treatments patient's wheezing has significantly improved.   Despite negative COVID-19 test I still have some concern that patient may be exhibiting symptoms of coronavirus, although he does not meet  criteria for admission given reassuring O2 saturations and improving lung sounds.  Will treat patient with steroids and continued bronchodilators at home.  I have encouraged him to purchase a home pulse oximeter to measure his oxygen saturation at home and discussed strict return precautions.  PCP follow-up recommended via telemedicine.  Patient expresses understanding and agreement with plan.  Discharged home in good condition.  Final Clinical Impressions(s) / ED Diagnoses   Final diagnoses:  Exacerbation of asthma, unspecified asthma severity, unspecified whether persistent  Suspected Covid-19 Virus Infection    ED Discharge Orders         Ordered    predniSONE (DELTASONE) 20 MG tablet  Daily     12/12/18 1808           Dartha Lodge, [place] 12/18/18 1634    Little, Ambrose Finland, MD 12/19/18 [phone]

## 2018-12-12 NOTE — ED Notes (Addendum)
Cordie Grice RN collected Nasal Swab for Coronavirus

## 2018-12-12 NOTE — ED Notes (Signed)
Pt. Ambulates in room with sats staying at 94-96% while ambulating in room.  No shortness of breath noted. Pt. Reports he has no dizziness and no cough noted.

## 2018-12-12 NOTE — ED Notes (Signed)
Pt. Was given a snack and drink earlier by RN Zola Runion

## 2018-12-12 NOTE — Discharge Instructions (Signed)
Please take prednisone daily as directed starting tomorrow morning, use your albuterol inhaler every 4-6 hours as needed and your Atrovent inhaler twice daily.  While your coronavirus testing was negative today I am still concerned about this given the symptoms that you have been having, and the test does have some false negatives.  Please continue to isolate at home, I recommend purchasing a pulse ox to monitor your oxygen levels at home if you begin having increasing shortness of breath, difficulty breathing or your oxygen levels drop below 90% it is very important that you return immediately to the emergency department.

## 2018-12-12 NOTE — ED Triage Notes (Signed)
Pt. Reports he feel lazy  And weak and his head is just not in it.  Pt. Is alert and oriented with reports of feeling like he has chest pain from his throat down. Pt. Is reporting his asthma is acting up but the inhaler in not working for him. Pt. Reports he has diarrhea.  Pt. Reports he has had night sweats.    Pt. Has cough in triage and reports history of pneumonia

## 2018-12-12 NOTE — ED Notes (Signed)
ED Provider at bedside. 

## 2019-04-29 ENCOUNTER — Ambulatory Visit (HOSPITAL_COMMUNITY)
Admission: EM | Admit: 2019-04-29 | Discharge: 2019-04-29 | Disposition: A | Discharge status: Home / Self Care | Payer: Self-pay | Attending: Emergency Medicine | Admitting: Emergency Medicine

## 2019-04-29 ENCOUNTER — Ambulatory Visit (INDEPENDENT_AMBULATORY_CARE_PROVIDER_SITE_OTHER): Payer: Self-pay

## 2019-04-29 ENCOUNTER — Other Ambulatory Visit: Payer: Self-pay

## 2019-04-29 ENCOUNTER — Encounter (HOSPITAL_COMMUNITY): Payer: Self-pay

## 2019-04-29 DIAGNOSIS — M272 Inflammatory conditions of jaws: Secondary | ICD-10-CM

## 2019-04-29 DIAGNOSIS — S62336B Displaced fracture of neck of fifth metacarpal bone, right hand, initial encounter for open fracture: Secondary | ICD-10-CM

## 2019-04-29 DIAGNOSIS — S62325A Displaced fracture of shaft of fourth metacarpal bone, left hand, initial encounter for closed fracture: Secondary | ICD-10-CM

## 2019-04-29 MED ORDER — CLINDAMYCIN HCL 300 MG PO CAPS: 300.0000 mg | ORAL_CAPSULE | Freq: Four times a day (QID) | ORAL | 0 refills | Status: AC

## 2019-04-29 MED ORDER — CHLORHEXIDINE GLUCONATE 0.12 % MT SOLN: OROMUCOSAL | 0 refills | Status: DC

## 2019-04-29 NOTE — ED Triage Notes (Signed)
Pt present right hand injury and mouth swelling. Pt accidentally hit the tv and his hand has swollen up. Pt top of the mouth is swollen.

## 2019-04-29 NOTE — Progress Notes (Signed)
Orthopedic Tech Progress Note Patient Details:  Keith Weber 1970/09/28 [id number]  Ortho Devices Type of Ortho Device: Ace wrap, Ulna gutter splint Ortho Device/Splint Location: right Ortho Device/Splint Interventions: Application   Post Interventions Patient Tolerated: Well Instructions Provided: Care of device   [place] Pink 04/29/2019, 3:10 PM

## 2019-04-29 NOTE — Discharge Instructions (Signed)
I suspect that you will need surgery on your hand.  It is not wise to let it heal on its own.  Follow-up with Dr. Lucretia Field as soon as you possibly can.  In the meantime, wear the splint at all times.  You may take Tylenol 1 g up to 3 or 4 times a day as needed for pain.  Finish clindamycin, even if you feel better.  Warm salt water rinses, chlorhexidine or Listerine rinses several times a day, particularly after smoking.  Follow-up with a dentist of your choice, see list that we gave you.

## 2019-04-29 NOTE — ED Notes (Signed)
Ortho paged. 

## 2019-04-29 NOTE — ED Provider Notes (Signed)
HPI  SUBJECTIVE:  Keith Weber is a right-handed 49 y.o. male who presents with 2 issues: She reports pain, swelling along the ulnar aspect of his right hand after punching a TV a week and a half ago.  He describes the pain as stabbing, sore. he reports numbness in his little finger.  He states that the pain seems to be getting better.  Symptoms are worse with holding his hand in a dependent position, no alleviating factors.  He has been wearing an elastic support.  Second, he reports 6 months of a painful mass on the roof of his mouth.  States that it started hurting again recently and has increased in size.  He thinks that it is draining.  This started several days ago.  He reports that all of his right upper teeth hurt.  He tried applying baking soda without improvement in symptoms.  No aggravating factors.  No fevers.  He has attempted to lance it with a razor blade in the past and was able to drain it with improvement in his symptoms.  He has a past medical history of chronic kidney disease stage III, asthma/COPD, continues to smoke, to right hand fractures that he "never got fixed".  No history of diabetes.    Past Medical History:  Diagnosis Date  . Asthma   . COPD (chronic obstructive pulmonary disease) ([place])   . GERD (gastroesophageal reflux disease)   . Sciatic nerve pain     Past Surgical History:  Procedure Laterality Date  . I&D EXTREMITY Right 06/16/2014   Procedure: IRRIGATION AND DEBRIDEMENT RIGHT THUMB AND FIRST FINGER AND PINNING ;  Surgeon: Roseanne Kaufman, MD;  Location: [facility];  Service: Orthopedics;  Laterality: Right;  . IRRIGATION AND DEBRIDEMENT KNEE Right 06/16/2014   Procedure: IRRIGATION AND DEBRIDEMENT POSTERIOR KNEE;  Surgeon: Roseanne Kaufman, MD;  Location: [facility];  Service: Orthopedics;  Laterality: Right;  . LACERATION REPAIR Right 06/16/2014   Procedure: REPAIR ODF THREE LACERATIONS POSTERIOR KNEE;  Surgeon: Roseanne Kaufman, MD;  Location: [facility];  Service:  Orthopedics;  Laterality: Right;    Family History  Family history unknown: Yes    Social History   Tobacco Use  . Smoking status: Current Some Day Smoker    Packs/day: 0.00    Years: 31.00    Pack years: 0.00    Types: Cigars  . Smokeless tobacco: Never Used  Substance Use Topics  . Alcohol use: Yes    Alcohol/week: 1.0 standard drinks    Types: 1 Cans of beer per week    Comment: occ  . Drug use: Yes    Types: Marijuana    No current facility-administered medications for this encounter.   Current Outpatient Medications:  .  albuterol (PROVENTIL HFA;VENTOLIN HFA) 108 (90 Base) MCG/ACT inhaler, Inhale 2 puffs into the lungs every 6 (six) hours as needed for wheezing or shortness of breath., Disp: 1 Inhaler, Rfl: 11 .  chlorhexidine (PERIDEX) 0.12 % solution, 15 mL swish and spit bid, Disp: 480 mL, Rfl: 0 .  clindamycin (CLEOCIN) 300 MG capsule, Take 1 capsule (300 mg total) by mouth 4 (four) times daily for 10 days., Disp: 40 capsule, Rfl: 0 .  fluticasone furoate-vilanterol (BREO ELLIPTA) 100-25 MCG/INH AEPB, Inhale 1 puff into the lungs every morning., Disp: 180 each, Rfl: 0 .  ipratropium-albuterol (DUONEB) 0.5-2.5 (3) MG/3ML SOLN, Take 3 mLs by nebulization every 6 (six) hours as needed., Disp: 360 mL, Rfl: 11 .  Multiple Vitamin (MULTIVITAMIN WITH MINERALS) TABS  tablet, Take 1 tablet by mouth daily., Disp: , Rfl:  .  pantoprazole (PROTONIX) 40 MG tablet, Take 1 tablet (40 mg total) by mouth daily., Disp: 90 tablet, Rfl: 3 .  varenicline (CHANTIX) 0.5 MG tablet, Take 1 tablet (0.5 mg total) by mouth 2 (two) times daily., Disp: 60 tablet, Rfl: 6  Allergies  Allergen Reactions  . Aspirin Anaphylaxis  . Peanut-Containing Drug Products Anaphylaxis  . Shellfish Allergy Anaphylaxis and Shortness Of Breath  . Amoxicillin Hives    Has patient had a PCN reaction causing immediate rash, facial/tongue/throat swelling, SOB or lightheadedness with hypotension:  Has patient had a  PCN reaction causing severe rash involving mucus membranes or skin necrosis:  Has patient had a PCN reaction that required hospitalization Yes Has patient had a PCN reaction occurring within the last 10 years: If all of the above answers are "NO", then may proceed with Cephalosporin use.   . Iodine Nausea And Vomiting and Swelling  . Kiwi Extract Swelling  . Naproxen Hives  . Latex Itching and Rash     ROS  As noted in HPI.   Physical Exam  BP 129/90 (BP Location: Left Arm)   Pulse 66   Temp 98.2 F (36.8 C) (Oral)   Resp 18   SpO2 100%   Constitutional: Well developed, well nourished, no acute distress Eyes:  EOMI, conjunctiva normal bilaterally HENT: Normocephalic, atraumatic,mucus membranes moist.  Positive diffuse tenderness over the right upper teeth.  Positive several areas of dental decay.  Positive tender swelling right hard palate with expressible purulent drainage.  No fluctuance.  No gingival swelling.  No trismus. Respiratory: Normal inspiratory effort Cardiovascular: Normal rate GI: nondistended skin: No rash, skin intact Musculoskeletal: Large swelling lateral right hand.  Positive tenderness over the fourth and fifth metacarpals.  Malrotation of the little finger.  The rest of the fingers, hand, wrist nontender.  Patient has FROM wrist and hand.  Sensation grossly intact in all fingers.  Patient is neurovascularly intact in the median/radial/ulnar distribution. Neurologic: Alert & oriented x 3, no focal neuro deficits Psychiatric: Speech and behavior appropriate   ED Course   Medications - No data to display  Orders Placed This Encounter  Procedures  . DG Hand Complete Right    Standing Status:   Standing    Number of Occurrences:   1    Order Specific Question:   Reason for Exam (SYMPTOM  OR DIAGNOSIS REQUIRED)    Answer:   punched tv 1.5 weeks ago swell tender 4th 5th [facility] eval fx  . Apply splint Ulnar Gutter    Standing Status:   Standing    Number  of Occurrences:   1    Order Specific Question:   Laterality    Answer:   Right    No results found for this or any previous visit (from the past 24 hour(s)). Dg Hand Complete Right  Result Date: 04/29/2019 CLINICAL DATA:  Pain post blunt trauma EXAM: RIGHT HAND - COMPLETE 3+ VIEW COMPARISON:  None FINDINGS: Transverse fracture of the mid fourth metacarpal with ulnar and dorsal displacement and mild palmar angulation of distal fracture fragment. Oblique comminuted fracture of the distal fifth metacarpal shaft with minimal displacement; mild impaction and palmar angulation of distal fracture fragment. No significant osseous degenerative change. Normal mineralization. IMPRESSION: Displaced and angulated fractures of the fourth and fifth metacarpals. Electronically Signed   By: Corlis Leak  Hassell M.D.   On: 04/29/2019 14:44    ED Clinical Impression  1. Displaced fracture of neck of fifth metacarpal bone, right hand, initial encounter for open fracture   2. Closed displaced fracture of shaft of fourth metacarpal bone of left hand, initial encounter   3. Hard palate abscess      ED Assessment/Plan  1.  Fourth, fifth metacarpal fracture.  Checking x-ray.  Reviewed imaging independently.  Oblique comminuted fracture of the distal fifth metacarpal shaft, transverse fracture of the mid fourth metacarpal with ulnar and dorsal displacement.  See radiology report for details.  Will place in an ulnar gutter splint.  Since it has been a week and a half, it appears to be stable however I suspect that he will need surgery. will have him follow-up with Dr. Hyacinth Meeker for further evaluation.  Home with Tylenol.  2.  Hard palate abscess.  It is already spontaneously draining.  Do not see the point of creating another incision.  Will send home with clindamycin 300 4 times daily for 10 days as he reports hives with amox, salt water rinses, Peridex/Listerine.  Follow-up with dentist of choice.  Will provide  list.  Calculated creatinine clearance from labs done earlier this year 60.15.  Discussed MDM, treatment plan, and plan for follow-up with patient. patient agrees with plan.   Meds ordered this encounter  Medications  . clindamycin (CLEOCIN) 300 MG capsule    Sig: Take 1 capsule (300 mg total) by mouth 4 (four) times daily for 10 days.    Dispense:  40 capsule    Refill:  0  . chlorhexidine (PERIDEX) 0.12 % solution    Sig: 15 mL swish and spit bid    Dispense:  480 mL    Refill:  0    *This clinic note was created using Scientist, clinical (histocompatibility and immunogenetics). Therefore, there may be occasional mistakes despite careful proofreading.   ?    Domenick Gong, MD 04/29/19 1511

## 2019-05-01 MED FILL — CLINDAMYCIN HCL 300 MG CAPS: 300 | 10 days supply | Qty: 40 | Fill #0

## 2019-06-30 MED FILL — CLINDAMYCIN HCL 300 MG CAPS: 300 | 10 days supply | Qty: 40 | Fill #0

## 2019-07-05 ENCOUNTER — Encounter (HOSPITAL_COMMUNITY): Payer: Self-pay | Admitting: Emergency Medicine

## 2019-07-05 ENCOUNTER — Emergency Department (HOSPITAL_COMMUNITY): Payer: Self-pay

## 2019-07-05 ENCOUNTER — Emergency Department (HOSPITAL_COMMUNITY)
Admission: EM | Admit: 2019-07-05 | Discharge: 2019-07-06 | Disposition: A | Discharge status: Home / Self Care | Payer: Self-pay | Attending: Emergency Medicine | Admitting: Emergency Medicine

## 2019-07-05 ENCOUNTER — Other Ambulatory Visit: Payer: Self-pay

## 2019-07-05 DIAGNOSIS — F1210 Cannabis abuse, uncomplicated: Secondary | ICD-10-CM | POA: Insufficient documentation

## 2019-07-05 DIAGNOSIS — K002 Abnormalities of size and form of teeth: Secondary | ICD-10-CM

## 2019-07-05 DIAGNOSIS — Z79899 Other long term (current) drug therapy: Secondary | ICD-10-CM | POA: Insufficient documentation

## 2019-07-05 DIAGNOSIS — Z91040 Latex allergy status: Secondary | ICD-10-CM | POA: Insufficient documentation

## 2019-07-05 DIAGNOSIS — K047 Periapical abscess without sinus: Secondary | ICD-10-CM | POA: Insufficient documentation

## 2019-07-05 DIAGNOSIS — F17290 Nicotine dependence, other tobacco product, uncomplicated: Secondary | ICD-10-CM | POA: Insufficient documentation

## 2019-07-05 DIAGNOSIS — J449 Chronic obstructive pulmonary disease, unspecified: Secondary | ICD-10-CM | POA: Insufficient documentation

## 2019-07-05 DIAGNOSIS — Z91010 Allergy to peanuts: Secondary | ICD-10-CM | POA: Insufficient documentation

## 2019-07-05 MED ORDER — LIDOCAINE VISCOUS HCL 2 % MT SOLN
15.0000 mL | Freq: Once | OROMUCOSAL | Status: AC
  Administered 2019-07-05: 15 mL via OROMUCOSAL
  Filled 2019-07-05: qty 15

## 2019-07-05 MED ORDER — HYDROCODONE-ACETAMINOPHEN 5-325 MG PO TABS: 1.0000 | ORAL_TABLET | Freq: Four times a day (QID) | ORAL | 0 refills | Status: DC | PRN

## 2019-07-05 MED ORDER — CLINDAMYCIN HCL 150 MG PO CAPS: 450.0000 mg | ORAL_CAPSULE | Freq: Three times a day (TID) | ORAL | 0 refills | Status: AC

## 2019-07-05 NOTE — ED Provider Notes (Addendum)
[facility] EMERGENCY DEPARTMENT Provider Note   CSN: [id number] Arrival date & time: 07/05/19  1711     History   Chief Complaint Chief Complaint  Patient presents with  . Dental Pain    HPI Keith Weber is a 48 y.o. male with a past medical history significant for asthma, COPD, and GERD who presents to the ED due to something stuck to the roof of his mouth.  Patient notes he has had swelling to the roof of his mouth for the past 8 months to a year. Patient has a history of dental pain and dental infections. He notes that the swelling decreased roughly 3 days ago and he noticed something sticking out of the roof of his mouth.  Patient was seen in urgent care a couple months ago for a dental infection and was given antibiotics.  Patient denies fever and chills. Patient denies difficulty swallowing and breathing. Patient is currently homeless and does not have dental follow-up. Patient denies difficulties tolerating oral secretion and difficulties breathing.  Past Medical History:  Diagnosis Date  . Asthma   . COPD (chronic obstructive pulmonary disease) (HCC)   . GERD (gastroesophageal reflux disease)   . Sciatic nerve pain     Patient Active Problem List   Diagnosis Date Noted  . CAP (community acquired pneumonia) 01/21/2016  . Right middle lobe pneumonia 01/21/2016  . Glucosuria with normal serum glucose 01/21/2016  . Lactic acidosis 01/21/2016  . CKD (chronic kidney disease) stage 3, GFR 30-59 ml/min 01/21/2016  . Cigarette smoker 06/12/2015  . COPD pfts pending/ still smoking  06/11/2015  . Asthma, chronic 10/16/2014  . Closed fracture of right thumb 06/16/2014    Past Surgical History:  Procedure Laterality Date  . I&D EXTREMITY Right 06/16/2014   Procedure: IRRIGATION AND DEBRIDEMENT RIGHT THUMB AND FIRST FINGER AND PINNING ;  Surgeon: Dominica Severin, MD;  Location: [facility] OR;  Service: Orthopedics;  Laterality: Right;  . IRRIGATION AND DEBRIDEMENT  KNEE Right 06/16/2014   Procedure: IRRIGATION AND DEBRIDEMENT POSTERIOR KNEE;  Surgeon: Dominica Severin, MD;  Location: [facility] OR;  Service: Orthopedics;  Laterality: Right;  . LACERATION REPAIR Right 06/16/2014   Procedure: REPAIR ODF THREE LACERATIONS POSTERIOR KNEE;  Surgeon: Dominica Severin, MD;  Location: [facility] OR;  Service: Orthopedics;  Laterality: Right;        Home Medications    Prior to Admission medications   Medication Sig Start Date End Date Taking? Authorizing Provider  albuterol (PROVENTIL HFA;VENTOLIN HFA) 108 (90 Base) MCG/ACT inhaler Inhale 2 puffs into the lungs every 6 (six) hours as needed for wheezing or shortness of breath. 01/31/18   Elvina Sidle, MD  chlorhexidine (PERIDEX) 0.12 % solution 15 mL swish and spit bid 04/29/19   Domenick Gong, MD  clindamycin (CLEOCIN) 150 MG capsule Take 3 capsules (450 mg total) by mouth 3 (three) times daily for 10 days. 07/05/19 07/15/19  Cheek, Rayfield Citizen B, PA-C  fluticasone furoate-vilanterol (BREO ELLIPTA) 100-25 MCG/INH AEPB Inhale 1 puff into the lungs every morning. 07/05/17   Claiborne Rigg, NP  HYDROcodone-acetaminophen (NORCO/VICODIN) 5-325 MG tablet Take 1 tablet by mouth every 6 (six) hours as needed. 07/05/19   Cheek, Vesta Mixer, PA-C  ipratropium-albuterol (DUONEB) 0.5-2.5 (3) MG/3ML SOLN Take 3 mLs by nebulization every 6 (six) hours as needed. 01/31/18   Elvina Sidle, MD  Multiple Vitamin (MULTIVITAMIN WITH MINERALS) TABS tablet Take 1 tablet by mouth daily.    [provider]  pantoprazole (PROTONIX) 40 MG tablet  Take 1 tablet (40 mg total) by mouth daily. 01/31/18 05/01/18  Elvina Sidle, MD  varenicline (CHANTIX) 0.5 MG tablet Take 1 tablet (0.5 mg total) by mouth 2 (two) times daily. 01/31/18   Elvina Sidle, MD    Family History Family History  Family history unknown: Yes    Social History Social History   Tobacco Use  . Smoking status: Current Some Day Smoker    Packs/day: 0.00    Years:  31.00    Pack years: 0.00    Types: Cigars  . Smokeless tobacco: Never Used  Substance Use Topics  . Alcohol use: Yes    Alcohol/week: 1.0 standard drinks    Types: 1 Cans of beer per week    Comment: occ  . Drug use: Yes    Types: Marijuana     Allergies   Aspirin, Peanut-containing drug products, Shellfish allergy, Amoxicillin, Iodine, Kiwi extract, Naproxen, and Latex   Review of Systems Review of Systems  Constitutional: Negative for chills and fever.  HENT: Positive for dental problem. Negative for facial swelling, trouble swallowing and voice change.   Respiratory: Negative for shortness of breath.   Cardiovascular: Negative for chest pain.  Skin: Positive for wound.     Physical Exam Updated Vital Signs BP (!) 150/95   Pulse 89   Temp 98.4 F (36.9 C) (Oral)   Resp 18   SpO2 100%   Physical Exam Vitals signs and nursing note reviewed.  Constitutional:      General: He is in acute distress.     Appearance: He is not ill-appearing.  HENT:     Head: Normocephalic.     Mouth/Throat:     Comments: Poor dentition throughout. 2cmx2cm area of edema and induration on right-side of palate with a calcified object sticking out. No fluctuance. No tenderness under tongue. Tongue in normal position and not protruded. Tolerating oral secretions well. Normal phonation. Eyes:     Pupils: Pupils are equal, round, and reactive to light.  Neck:     Musculoskeletal: Neck supple.  Cardiovascular:     Rate and Rhythm: Normal rate and regular rhythm.     Pulses: Normal pulses.     Heart sounds: Normal heart sounds. No murmur. No friction rub. No gallop.   Pulmonary:     Effort: Pulmonary effort is normal.     Breath sounds: Normal breath sounds.  Abdominal:     General: Abdomen is flat. There is no distension.     Palpations: Abdomen is soft.     Tenderness: There is no abdominal tenderness. There is no guarding or rebound.  Musculoskeletal:     Comments: Able to move all  4 extremities without difficulty  Skin:    General: Skin is warm and dry.  Neurological:     General: No focal deficit present.     Mental Status: He is alert.      ED Treatments / Results  Labs (all labs ordered are listed, but only abnormal results are displayed) Labs Reviewed - No data to display  EKG None  Radiology Dg Orthopantogram  Result Date: 07/05/2019 CLINICAL DATA:  Dense growth from the roof of the mouth is perceived by the patient. EXAM: ORTHOPANTOGRAM/PANORAMIC COMPARISON:  None. FINDINGS: Dental cavities of teeth # 1, 16, 17, and 21. Multiple teeth are missing, especially along the mandible. The right mandibular condyle is excluded. The left mandibular condyle appears normal. No fracture is observed. Typical midline artifact is present. IMPRESSION: 1. There is  midline artifact which is typical of a Panorex view. This could well obscure a midline structure along the roof of the mouth. I do not see a definite structure as described by the patient, but facial CT would be much more accurate especially around the midline. 2. Multiple dental cavities. Electronically Signed   By: Gaylyn RongWalter  Liebkemann M.D.   On: 07/05/2019 20:14   Ct Maxillofacial Wo Contrast  Result Date: 07/05/2019 CLINICAL DATA:  Swelling in the roof of the mouth with dental pain, initial encounter EXAM: CT MAXILLOFACIAL WITHOUT CONTRAST TECHNIQUE: Multidetector CT imaging of the maxillofacial structures was performed. Multiplanar CT image reconstructions were also generated. COMPARISON:  None. FINDINGS: Osseous: Breakdown of the maxilla is noted just to the right of the midline with a bony fragment in the soft tissues of the roof of the mouth to the right of the midline. Associated bony destruction is noted suggestive of large periapical abscess involving the central and lateral incisor in the right maxilla. A similar periapical lucency is noted surrounding the root of the first maxillary bicuspid on the right is  noted as well. This appears to communicate with the periapical lucency seen surrounding the incisors on the right. Multiple dental caries are identified. Dental hardware is noted involving second bicuspid on the left in the mandible as well as within the second bicuspid on the left in the maxilla. The mandibular root canal extends into the defect created by the dental caries and may correspond with the sensation of a wire in the gums. Correlation with the physical exam and clinical history is recommended. Orbits: Within normal limits. Sinuses: Mild mucosal thickening is noted within the paranasal sinuses. Soft tissues: No other soft tissue abnormality is seen. Limited intracranial: No significant or unexpected finding. IMPRESSION: Periapical lucencies as described within the right maxilla with a focal breakdown of the maxilla and fracture with a bony fragment beneath the mucosa of the roof of the mouth to the right of the midline. These changes are likely related to longstanding dental disease and may represent periapical lucencies. They do not communicate with the paranasal sinus on the right. Multiple dental caries are seen. The left mandibular second bicuspid demonstrates evidence of dental hardware likely related to prior root canal which appears to extend to the surface of the decayed tooth. This may correspond to the physical abnormality described by the patient. Correlation with the physical exam and history is recommended. Electronically Signed   By: Alcide CleverMark  Lukens M.D.   On: 07/05/2019 21:17    Procedures Procedures (including critical care time)  Medications Ordered in ED Medications  lidocaine (XYLOCAINE) 2 % viscous mouth solution 15 mL (15 mLs Mouth/Throat Given 07/05/19 1828)     Initial Impression / Assessment and Plan / ED Course  I have reviewed the triage vital signs and the nursing notes.  Pertinent labs & imaging results that were available during my care of the patient were reviewed  by me and considered in my medical decision making (see chart for details).       48 year old male presents to the ED for evaluation of dental pain and calcified object sticking out of roof of mouth. Stable vitals. Patient in no acute distress and non-ill appearing. 2cmx2cm area of induration on right side of palate with calcified objecting sticking out. No fluctulance. Poor dentition throughout. No trismus. Normal phonation. Patient is tolerating oral secretions without difficulty. No concern for ludwigs or deep tissue infection. Area of induration not amendable to I&D.  Attempted to remove object from roof of mouth, but unsuccessful. I did not feel comfortable to continue to attempt to pull it out without knowing what it was. Orthopantogram was unable to identify object, so CT maxillofacial was performed. CT personally reviewed which demonstrates:  Periapical lucencies as described within the right maxilla with a  focal breakdown of the maxilla and fracture with a bony fragment  beneath the mucosa of the roof of the mouth to the right of the  midline. These changes are likely related to longstanding dental  disease and may represent periapical lucencies. They do not  communicate with the paranasal sinus on the right.    Multiple dental caries are seen. The left mandibular second bicuspid  demonstrates evidence of dental hardware likely related to prior  root canal which appears to extend to the surface of the decayed  tooth. This may correspond to the physical abnormality described by  the patient. Correlation with the physical exam and history is  recommended.   Unfortunately, there is no further work-up or treatment able to be given here in the ED. Case management consulted given patient is homeless to help with medication prescriptions. Will send patient home with Clindamycin given he is allergic to PCN and some pain medications. Patient has been given the dentist on call for further  treatment. I have stressed the importance of dental follow-up. Strict ED precautions discussed with patient. Patient states understanding and agrees to plan. Patient discharged home in no acute distress and stable vitals   Discussed case with Dr. Langston Masker who evaluated patient and agrees with assessment and plan. Final Clinical Impressions(s) / ED Diagnoses   Final diagnoses:  Tooth abnormal shape  Dental abscess    ED Discharge Orders         Ordered    clindamycin (CLEOCIN) 150 MG capsule  3 times daily     07/05/19 2248    HYDROcodone-acetaminophen (NORCO/VICODIN) 5-325 MG tablet  Every 6 hours PRN     07/05/19 2248          Edited note on 07/06/2019: word clarification and added attending evaluation   Jonette Eva, PA-C 07/05/19 2308    Wyvonnia Dusky, MD 07/06/19 [address] [place], [place] 07/06/19 1325    Wyvonnia Dusky, MD 07/06/19 [phone]

## 2019-07-05 NOTE — Discharge Instructions (Signed)
I have given you the number of the dentist on call. I would try and call them on Monday to see if they can do anything for you. Tell them your CT scan in the ER showed a lot of damage and infection. I am also prescribing you an antibiotic and pain medications. Take as prescribed and finish all of your antibiotics. Save the pain medications for severe pain. Return to the ER for new or worsening symptoms.

## 2019-07-05 NOTE — Care Management (Signed)
  [place] Medication Assistance Card Name: Keith Weber ID (MRN): [id number] [place]: [id number]  RX Group: [id number] Discharge Date: 07/05/2019 Expiration Date: 07/18/2019                                           (must be filled within 7 days of discharge)   ED CM consulted by .....Marland Kitchen concerning recommendations for [facility] services. Pt presented to ED with s/p fall and weakness. PMH .Marland Kitchen... Reviewed record,noted ...  Met with patient and family at bedside, verbal consent given to discuss care in the presence of family.  Verified information with patient and family. Pt lives at home at home with wife primary care giver.Patient ambulates at home with walker. Discussed the recommendation for Home Health services, patient and family agreeable with this disposition plan. Discussed HH services RN/PT/OT/HHA . Offered choice, provided [facility] agency list, Patient selected [place] services.. No DME needs identified/ DME needs identified: rolling walker, w/c and 3:1 chair at home. Verified current address and phone number with patient and family, [facility] Referral with start of care (date) faxed to [phone] Fax confirmation received. Updated Dr... and   RN on Pod E they are agreeable with discharge plan. Informed patient and family that  A nurse from [facility] will contact them at the number provided within 24 -48hr to set up a the initial assessment visit, they verbalized understanding teach back done.  [facility] brochure given with contact information should any questions or concerns  arise regarding  [place] services. Pt and family appreicative and agrees with discharge plan. ED CM will follow up with patient and family towards the end of the week.     Dear  Keith Weber  You have been approved to have the prescriptions written by your discharging physician filled through our [facility] (Medication Assistance Through [facility]) program. This program allows for a one-time (no refills) 34-day supply of selected medications for a low copay amount.  The  copay is $3.00 per prescription. For instance, if you have one prescription, you will pay $3.00; for two prescriptions, you pay $6.00; for three prescriptions, you pay $9.00; and so on.  Only certain pharmacies are participating in this program with [facility]. You will need to select one of the pharmacies from the attached list and take your prescriptions, this letter, and your photo ID to one of the participating pharmacies.   We are excited that you are able to use the [facility] program to get your medications. These prescriptions must be filled within 7 days of hospital discharge or they will no longer be valid for the [facility] program. Should you have any problems with your prescriptions please contact your case management team member at [phone] for [place]/ [place] you, [facility]

## 2019-07-05 NOTE — ED Triage Notes (Signed)
Pt reports some swelling to roof and feels like a wire is sticking out of his gums. Also endorses some dental pain.

## 2019-07-10 ENCOUNTER — Telehealth: Payer: Self-pay

## 2019-07-10 NOTE — Telephone Encounter (Signed)
Call received from [facility]. She requested that a referral be made to [facility]. She asked that the patient contact her to review the referral and determine if she is able to assist with the disability application.  The patient has no phone and he said he has no way he can be contacted,.  This instruction for the patient to call her will need to be provided to the patient when he contacts this office.

## 2019-07-10 NOTE — Telephone Encounter (Signed)
Met with the patient when he was in the clinic today. He explained that he is trying to obtain an orange card and has the application.  He is currently homeless iwith no income.  He said that he is living on the street. He explained that he went to the [facility] this morning but thinks he was there too early he plans to go back and check bed availability.  The patient said that he receives behavioral health services at [facility] and does not want to stay to far from [place] .  He does not want to go to [employer]. Informed him that [facility] may be able to assist with housing but he stated that he first needs to obtain medicaid.    Call placed to [facility] she explained that they have no shelter beds available at this time and the patient should go back to the [facility] as they are managing their own COVID testing and quarantine. That is the only option at this time     Message left for [facility] to inquire if the patient would be eligible for assistance with disability application. Call back requested to this CM the patient said that he had applied and worked with an attorney for about 4 years and has not received an status update on his application.  Shared the information from Epimenio Sarin with the patient.  He said that he would go back to [facility] this morning.  Also instructed him to go to [facility] to use the phone to obtain the IRS non -filing letter that is needed for the [facility] Card/ [employer]. Instructed him to contact this CM if he has any questions. He has no phone and no contact humber so he will need to call this CM with update regarding the [facility]

## 2019-07-11 ENCOUNTER — Telehealth: Payer: Self-pay

## 2019-07-11 NOTE — Telephone Encounter (Signed)
Referral for assistance with disability application/SOAR program  sent via [place] email to the [facility].

## 2019-08-08 ENCOUNTER — Other Ambulatory Visit: Payer: Self-pay

## 2019-08-08 ENCOUNTER — Ambulatory Visit: Payer: Self-pay | Attending: Nurse Practitioner | Admitting: Nurse Practitioner

## 2019-08-29 ENCOUNTER — Emergency Department (HOSPITAL_COMMUNITY)
Admission: EM | Admit: 2019-08-29 | Discharge: 2019-08-29 | Disposition: A | Discharge status: Home / Self Care | Payer: Self-pay | Attending: Emergency Medicine | Admitting: Emergency Medicine

## 2019-08-29 ENCOUNTER — Other Ambulatory Visit: Payer: Self-pay

## 2019-08-29 ENCOUNTER — Emergency Department (HOSPITAL_COMMUNITY): Payer: Self-pay

## 2019-08-29 ENCOUNTER — Encounter (HOSPITAL_COMMUNITY): Payer: Self-pay | Admitting: Emergency Medicine

## 2019-08-29 DIAGNOSIS — F17210 Nicotine dependence, cigarettes, uncomplicated: Secondary | ICD-10-CM | POA: Insufficient documentation

## 2019-08-29 DIAGNOSIS — Z91040 Latex allergy status: Secondary | ICD-10-CM | POA: Insufficient documentation

## 2019-08-29 DIAGNOSIS — J449 Chronic obstructive pulmonary disease, unspecified: Secondary | ICD-10-CM | POA: Insufficient documentation

## 2019-08-29 DIAGNOSIS — Z79899 Other long term (current) drug therapy: Secondary | ICD-10-CM | POA: Insufficient documentation

## 2019-08-29 DIAGNOSIS — F1210 Cannabis abuse, uncomplicated: Secondary | ICD-10-CM | POA: Insufficient documentation

## 2019-08-29 DIAGNOSIS — N451 Epididymitis: Secondary | ICD-10-CM | POA: Insufficient documentation

## 2019-08-29 DIAGNOSIS — J45909 Unspecified asthma, uncomplicated: Secondary | ICD-10-CM | POA: Insufficient documentation

## 2019-08-29 DIAGNOSIS — N50811 Right testicular pain: Secondary | ICD-10-CM | POA: Insufficient documentation

## 2019-08-29 DIAGNOSIS — N183 Chronic kidney disease, stage 3 unspecified: Secondary | ICD-10-CM | POA: Insufficient documentation

## 2019-08-29 LAB — URINALYSIS, ROUTINE W REFLEX MICROSCOPIC
Bacteria, UA: NONE SEEN
Bilirubin Urine: NEGATIVE
Glucose, UA: NEGATIVE mg/dL
Hgb urine dipstick: NEGATIVE
Ketones, ur: NEGATIVE mg/dL
Leukocytes,Ua: NEGATIVE
Nitrite: NEGATIVE
Protein, ur: 30 mg/dL — AB
Specific Gravity, Urine: 1.014 (ref 1.005–1.030)
pH: 9 — ABNORMAL HIGH (ref 5.0–8.0)

## 2019-08-29 MED ORDER — DOXYCYCLINE HYCLATE 100 MG PO CAPS: 100.0000 mg | ORAL_CAPSULE | Freq: Two times a day (BID) | ORAL | 0 refills | Status: DC

## 2019-08-29 MED ORDER — ACETAMINOPHEN 325 MG PO TABS
650.0000 mg | ORAL_TABLET | Freq: Once | ORAL | Status: AC
  Administered 2019-08-29: 650 mg via ORAL
  Filled 2019-08-29: qty 2

## 2019-08-29 MED ORDER — AEROCHAMBER PLUS FLO-VU LARGE MISC: 1.0000 | Freq: Once | Status: AC

## 2019-08-29 MED ORDER — ALBUTEROL SULFATE HFA 108 (90 BASE) MCG/ACT IN AERS
2.0000 | INHALATION_SPRAY | Freq: Once | RESPIRATORY_TRACT | Status: AC
  Administered 2019-08-29: 2 via RESPIRATORY_TRACT
  Filled 2019-08-29: qty 6.7

## 2019-08-29 MED ORDER — AEROCHAMBER PLUS FLO-VU LARGE MISC
Status: AC
  Administered 2019-08-29: 17:00:00 1
  Filled 2019-08-29: qty 1

## 2019-08-29 MED ORDER — AZITHROMYCIN 250 MG PO TABS
1000.0000 mg | ORAL_TABLET | Freq: Once | ORAL | Status: AC
  Administered 2019-08-29: 1000 mg via ORAL
  Filled 2019-08-29: qty 4

## 2019-08-29 NOTE — ED Notes (Signed)
Patient verbalizes understanding of discharge instructions. Opportunity for questioning and answers were provided. Armband removed by staff, pt discharged from ED.  

## 2019-08-29 NOTE — ED Provider Notes (Signed)
[facility] EMERGENCY DEPARTMENT Provider Note   CSN: [id number] Arrival date & time: 08/29/19  1319     History Chief Complaint  Patient presents with   Flank Pain    Keith Weber is a 49 y.o. male with a past medical history significant for asthma, COPD, and GERD who presents to the ED due to gradual onset of worsening right testicular pain x 2 days. Patient denies injury. Patient notes testicular pain is associated with edema. He notes that his testicular pain radiates up to the right groin region. He also admits to 7/10 right low back pain. Patient denies penile discharge. Patient admits to dysuria a few days ago that has completely resolved. He notes he has not been sexually activity for 3-4 weeks and has no concerns for STDs at this time. Patient denies fever, chills, abdominal pain, nausea, vomiting, and diarrhea. Patient denies painful bowel movements.     Past Medical History:  Diagnosis Date   Asthma    COPD (chronic obstructive pulmonary disease) (HCC)    GERD (gastroesophageal reflux disease)    Sciatic nerve pain     Patient Active Problem List   Diagnosis Date Noted   CAP (community acquired pneumonia) 01/21/2016   Right middle lobe pneumonia 01/21/2016   Glucosuria with normal serum glucose 01/21/2016   Lactic acidosis 01/21/2016   CKD (chronic kidney disease) stage 3, GFR 30-59 ml/min 01/21/2016   Cigarette smoker 06/12/2015   COPD pfts pending/ still smoking  06/11/2015   Asthma, chronic 10/16/2014   Closed fracture of right thumb 06/16/2014    Past Surgical History:  Procedure Laterality Date   I & D EXTREMITY Right 06/16/2014   Procedure: IRRIGATION AND DEBRIDEMENT RIGHT THUMB AND FIRST FINGER AND PINNING ;  Surgeon: Dominica Severin, MD;  Location: [facility] OR;  Service: Orthopedics;  Laterality: Right;   IRRIGATION AND DEBRIDEMENT KNEE Right 06/16/2014   Procedure: IRRIGATION AND DEBRIDEMENT POSTERIOR KNEE;  Surgeon: Dominica Severin, MD;  Location: [facility] OR;  Service: Orthopedics;  Laterality: Right;   LACERATION REPAIR Right 06/16/2014   Procedure: REPAIR ODF THREE LACERATIONS POSTERIOR KNEE;  Surgeon: Dominica Severin, MD;  Location: [facility] OR;  Service: Orthopedics;  Laterality: Right;       Family History  Family history unknown: Yes    Social History   Tobacco Use   Smoking status: Current Some Day Smoker    Packs/day: 0.00    Years: 31.00    Pack years: 0.00    Types: Cigars   Smokeless tobacco: Never Used  Substance Use Topics   Alcohol use: Yes    Alcohol/week: 1.0 standard drinks    Types: 1 Cans of beer per week    Comment: occ   Drug use: Yes    Types: Marijuana    Home Medications Prior to Admission medications   Medication Sig Start Date End Date Taking? Authorizing Provider  albuterol (PROVENTIL HFA;VENTOLIN HFA) 108 (90 Base) MCG/ACT inhaler Inhale 2 puffs into the lungs every 6 (six) hours as needed for wheezing or shortness of breath. 01/31/18   Elvina Sidle, MD  chlorhexidine (PERIDEX) 0.12 % solution 15 mL swish and spit bid 04/29/19   Domenick Gong, MD  doxycycline (VIBRAMYCIN) 100 MG capsule Take 1 capsule (100 mg total) by mouth 2 (two) times daily. 08/29/19   Mannie Stabile, PA-C  fluticasone furoate-vilanterol (BREO ELLIPTA) 100-25 MCG/INH AEPB Inhale 1 puff into the lungs every morning. 07/05/17   Claiborne Rigg, NP  HYDROcodone-acetaminophen (NORCO/VICODIN)  5-325 MG tablet Take 1 tablet by mouth every 6 (six) hours as needed. 07/05/19   Suzy Bouchard, PA-C  ipratropium-albuterol (DUONEB) 0.5-2.5 (3) MG/3ML SOLN Take 3 mLs by nebulization every 6 (six) hours as needed. 01/31/18   Robyn Haber, MD  Multiple Vitamin (MULTIVITAMIN WITH MINERALS) TABS tablet Take 1 tablet by mouth daily.    [provider]  pantoprazole (PROTONIX) 40 MG tablet Take 1 tablet (40 mg total) by mouth daily. 01/31/18 05/01/18  Robyn Haber, MD  varenicline (CHANTIX) 0.5 MG  tablet Take 1 tablet (0.5 mg total) by mouth 2 (two) times daily. 01/31/18   Robyn Haber, MD    Allergies    Aspirin, Peanut-containing drug products, Shellfish allergy, Amoxicillin, Iodine, Kiwi extract, Naproxen, and Latex  Review of Systems   Review of Systems  Constitutional: Negative for chills and fever.  Respiratory: Negative for shortness of breath.   Cardiovascular: Negative for chest pain.  Gastrointestinal: Negative for abdominal pain, diarrhea, nausea and vomiting.  Genitourinary: Positive for dysuria (resolved), frequency, scrotal swelling and testicular pain. Negative for difficulty urinating, discharge, flank pain, hematuria, penile pain and penile swelling.  Musculoskeletal: Positive for back pain (right lower back pain).  Skin: Negative for rash.  All other systems reviewed and are negative.   Physical Exam Updated Vital Signs BP 117/82 (BP Location: Left Arm)    Pulse 72    Temp 98.1 F (36.7 C) (Oral)    Resp 17    Ht 5\' 11"  (1.803 m)    Wt 74.8 kg    SpO2 100%    BMI 23.01 kg/m   Physical Exam Vitals and nursing note reviewed. Exam conducted with a chaperone present.  Constitutional:      General: He is not in acute distress.    Appearance: He is not ill-appearing.  HENT:     Head: Normocephalic.  Eyes:     Pupils: Pupils are equal, round, and reactive to light.  Cardiovascular:     Rate and Rhythm: Normal rate and regular rhythm.     Pulses: Normal pulses.     Heart sounds: Normal heart sounds. No murmur. No friction rub. No gallop.   Pulmonary:     Effort: Pulmonary effort is normal.     Breath sounds: Normal breath sounds.     Comments: Expiratory wheeze heard throughout.  Abdominal:     General: Abdomen is flat. Bowel sounds are normal. There is no distension.     Palpations: Abdomen is soft.     Tenderness: There is no abdominal tenderness. There is no right CVA tenderness, left CVA tenderness, guarding or rebound.  Genitourinary:    Pubic  Area: No rash or pubic lice.      Penis: Normal and circumcised. No tenderness, discharge, swelling or lesions.      Testes:        Right: Tenderness and swelling present.        Left: Tenderness or swelling not present.     Epididymis:     Right: Enlarged.     Left: Not enlarged.  Musculoskeletal:     Cervical back: Neck supple.     Right lower leg: No edema.     Left lower leg: No edema.     Comments: Mild tenderness to palpation in right lumbar paraspinal region.   Skin:    General: Skin is warm and dry.  Neurological:     General: No focal deficit present.     Mental Status: He  is alert.     ED Results / Procedures / Treatments   Labs (all labs ordered are listed, but only abnormal results are displayed) Labs Reviewed  URINALYSIS, ROUTINE W REFLEX MICROSCOPIC - Abnormal; Notable for the following components:      Result Value   Color, Urine STRAW (*)    pH 9.0 (*)    Protein, ur 30 (*)    All other components within normal limits  URINE CULTURE  GC/CHLAMYDIA PROBE AMP ([place]) NOT AT [facility]    EKG None  Radiology US SCROTUM W/DOPPLER  Result Date: 08/29/2019 CLINICAL DATA:  RIGHT testicular pain. EXAM: SCROTAL ULTRASOUND DOPPLER ULTRASOUND OF THE TESTICLES TECHNIQUE: Complete ultrasound examination of the testicles, epididymis, and other scrotal structures was performed. Color and spectral Doppler ultrasound were also utilized to evaluate blood flow to the testicles. COMPARISON:  None. FINDINGS: Right testicle Measurements: Normal in size at 4.1 x 2.7 x 3.0 cm. No mass or microlithiasis visualized. Left testicle Measurements: Normal in size at 4.4 x 2.4 x 3.2 cm. No mass or microlithiasis visualized. Right epididymis: RIGHT epididymis is slightly enlarged and hyperemic. Left epididymis:  Normal in size and appearance. Hydrocele:  Small RIGHT hydrocele Varicocele:  None visualized. Pulsed Doppler interrogation of both testes demonstrates normal low resistance arterial  and venous waveforms bilaterally. IMPRESSION: 1. Mild RIGHT epididymitis and small RIGHT hydrocele. 2. Normal testicles.  No evidence of orchitis. Electronically Signed   By: Genevive Bi M.D.   On: 08/29/2019 15:34    Procedures Procedures (including critical care time)  Medications Ordered in ED Medications  acetaminophen (TYLENOL) tablet 650 mg (650 mg Oral Given 08/29/19 1544)  albuterol (VENTOLIN HFA) 108 (90 Base) MCG/ACT inhaler 2 puff (2 puffs Inhalation Given 08/29/19 1705)  AeroChamber Plus Flo-Vu Large MISC 1 each (1 each Other Given 08/29/19 1707)  azithromycin (ZITHROMAX) tablet 1,000 mg (1,000 mg Oral Given 08/29/19 1703)    ED Course  I have reviewed the triage vital signs and the nursing notes.  Pertinent labs & imaging results that were available during my care of the patient were reviewed by me and considered in my medical decision making (see chart for details).    MDM Rules/Calculators/A&P                     49 year old male presents to the ED due to right testicular pain that radiates up in the groin region.  Patient also admits to right-sided lumbar paraspinal tenderness.  Patient denies testicular injury.  He was last sexually active 3 to 4 weeks ago.  Patient is currently homeless.  Vitals all within normal limits.  Patient is afebrile, not tachycardic or hypoxic.  Abdomen soft, nondistended, nontender.  Negative CVA tenderness bilaterally.  Mild lumbar paraspinal tenderness. No midline tenderness. No radicular symptoms. No concern for cauda equina or central cord syndrome. Right testicle with tenderness to palpation with moderate amount of edema. Penis normal with no discharge or lesions. Lungs with wheeze heard throughout. Will give a few puffs of albuterol. Will obtain UA to rule out UTI. Will also obtain ultrasound to rule out testicular torsion, testicular mass, epididymitis, and orchitis.   UA personally reviewed which is significant for proteinuria, but no  signs of infection. Doubt UTI/pyelonephritis. GC/Chlamydia test pending. Urine culture pending. Ultrasound personally reviewed which demonstrates mild right epididymitis with small right hydrocele. No signs of orchitis. Given there may be some concern for STDs will treat with Doxycycline BID x 10 days and  1g of Azithromycin since patient is severely allergic to PCN. Discussed case with Burna Mortimer from social work who has helped patient get medication for free. Pt denies a hx of renal issues or rectal pain. Patient advised to take over the counter tylenol or ibuprofen as needed for pain. Patient has been advised to rest, ice and scrotal support to help he is pain. Recommended purchasing jockstrap. Presentation non-concerning for testicular torsion or prostatitis. Patient is hemodynamically stable and in no acute distress prior to discharge. Strict ED precautions discussed with patient. Patient states understanding and agrees to plan. Patient discharged home in no acute distress and stable vitals  Final Clinical Impression(s) / ED Diagnoses Final diagnoses:  Epididymitis    Rx / DC Orders ED Discharge Orders         Ordered    doxycycline (VIBRAMYCIN) 100 MG capsule  2 times daily     08/29/19 1649           Jesusita Oka 08/29/19 2054    Terald Sleeper, MD 08/30/19 [phone]

## 2019-08-29 NOTE — Care Management (Addendum)
  MATCH Medication Assistance Card Name: Ralston Venus ID (MRN): [id number] [facility]: [id number] RX Group: [id number] Discharge Date: 08/29/2019 Expiration Date: 09/12/2019                                           (must be filled within 7 days of discharge)       Dear   : Keith Weber  You have been approved to have the prescriptions written by your discharging physician filled through our [facility] (Medication Assistance Through [facility]) program. This program allows for a one-time (no refills) 34-day supply of selected medications for a low copay amount.  The copay is $3.00 per prescription. For instance, if you have one prescription, you will pay $3.00; for two prescriptions, you pay $6.00; for three prescriptions, you pay $9.00; and so on.  Only certain pharmacies are participating in this program with [facility]. You will need to select one of the pharmacies from the attached list and take your prescriptions, this letter, and your photo ID to one of the participating pharmacies.   We are excited that you are able to use the [facility] program to get your medications. These prescriptions must be filled within 7 days of hospital discharge or they will no longer be valid for the [facility] program. Should you have any problems with your prescriptions please contact your case management team member at [phone] for [place]/ [facility].  Thank you, [facility] Care Management

## 2019-08-29 NOTE — Discharge Instructions (Addendum)
As discussed, your ultrasound showed epididymitis which is an infection. You were given some antibiotics here, but I have also sent an antibiotic to [facility] on [address] which should be free. Take twice a day for 10 days. You may take over the counter ibuprofen or tylenol as needed for pain. Your infection should clear up with the antibiotic treatment. I have included the number for [facility]. I recommend calling tomorrow to schedule an appointment to establish care. They accept patients without health insurance. Return to the ER for new or worsening symptoms.

## 2019-08-29 NOTE — ED Triage Notes (Signed)
Pt reports 1 day hx of left flank pain/dysuria. Rating pain 7/10. Denies recent fever. Pt ambulatory, NAD at present.

## 2019-08-30 LAB — GC/CHLAMYDIA PROBE AMP (~~LOC~~) NOT AT ARMC
Chlamydia: NEGATIVE
Neisseria Gonorrhea: NEGATIVE

## 2019-08-30 LAB — URINE CULTURE: Culture: NO GROWTH

## 2019-09-09 ENCOUNTER — Emergency Department (HOSPITAL_COMMUNITY): Payer: Self-pay

## 2019-09-09 ENCOUNTER — Inpatient Hospital Stay (HOSPITAL_COMMUNITY)
Admission: EM | Admit: 2019-09-09 | Discharge: 2019-09-14 | DRG: 200 | Disposition: A | Discharge status: Home / Self Care | Payer: Self-pay | Attending: General Surgery | Admitting: General Surgery

## 2019-09-09 ENCOUNTER — Other Ambulatory Visit: Payer: Self-pay

## 2019-09-09 ENCOUNTER — Encounter (HOSPITAL_COMMUNITY): Payer: Self-pay | Admitting: Pharmacy Technician

## 2019-09-09 DIAGNOSIS — J45909 Unspecified asthma, uncomplicated: Secondary | ICD-10-CM

## 2019-09-09 DIAGNOSIS — Z7951 Long term (current) use of inhaled steroids: Secondary | ICD-10-CM

## 2019-09-09 DIAGNOSIS — J939 Pneumothorax, unspecified: Secondary | ICD-10-CM | POA: Diagnosis present

## 2019-09-09 DIAGNOSIS — N183 Chronic kidney disease, stage 3 unspecified: Secondary | ICD-10-CM | POA: Diagnosis present

## 2019-09-09 DIAGNOSIS — S42102A Fracture of unspecified part of scapula, left shoulder, initial encounter for closed fracture: Secondary | ICD-10-CM | POA: Diagnosis present

## 2019-09-09 DIAGNOSIS — J9811 Atelectasis: Secondary | ICD-10-CM | POA: Diagnosis present

## 2019-09-09 DIAGNOSIS — W11XXXA Fall on and from ladder, initial encounter: Secondary | ICD-10-CM | POA: Diagnosis present

## 2019-09-09 DIAGNOSIS — Z20822 Contact with and (suspected) exposure to covid-19: Secondary | ICD-10-CM | POA: Diagnosis present

## 2019-09-09 DIAGNOSIS — S2241XA Multiple fractures of ribs, right side, initial encounter for closed fracture: Secondary | ICD-10-CM | POA: Diagnosis present

## 2019-09-09 DIAGNOSIS — S27321A Contusion of lung, unilateral, initial encounter: Secondary | ICD-10-CM | POA: Diagnosis present

## 2019-09-09 DIAGNOSIS — Z886 Allergy status to analgesic agent status: Secondary | ICD-10-CM

## 2019-09-09 DIAGNOSIS — F17290 Nicotine dependence, other tobacco product, uncomplicated: Secondary | ICD-10-CM | POA: Diagnosis present

## 2019-09-09 DIAGNOSIS — Z91018 Allergy to other foods: Secondary | ICD-10-CM

## 2019-09-09 DIAGNOSIS — Z4682 Encounter for fitting and adjustment of non-vascular catheter: Secondary | ICD-10-CM

## 2019-09-09 DIAGNOSIS — J449 Chronic obstructive pulmonary disease, unspecified: Secondary | ICD-10-CM | POA: Diagnosis present

## 2019-09-09 DIAGNOSIS — S43102A Unspecified dislocation of left acromioclavicular joint, initial encounter: Secondary | ICD-10-CM | POA: Diagnosis present

## 2019-09-09 DIAGNOSIS — Z91040 Latex allergy status: Secondary | ICD-10-CM

## 2019-09-09 DIAGNOSIS — K219 Gastro-esophageal reflux disease without esophagitis: Secondary | ICD-10-CM | POA: Diagnosis present

## 2019-09-09 DIAGNOSIS — Z91010 Allergy to peanuts: Secondary | ICD-10-CM

## 2019-09-09 DIAGNOSIS — Z880 Allergy status to penicillin: Secondary | ICD-10-CM

## 2019-09-09 DIAGNOSIS — Z888 Allergy status to other drugs, medicaments and biological substances status: Secondary | ICD-10-CM

## 2019-09-09 DIAGNOSIS — Z79899 Other long term (current) drug therapy: Secondary | ICD-10-CM

## 2019-09-09 DIAGNOSIS — Z79891 Long term (current) use of opiate analgesic: Secondary | ICD-10-CM

## 2019-09-09 DIAGNOSIS — S270XXA Traumatic pneumothorax, initial encounter: Principal | ICD-10-CM | POA: Diagnosis present

## 2019-09-09 LAB — RESPIRATORY PANEL BY RT PCR (FLU A&B, COVID)
Influenza A by PCR: NEGATIVE
Influenza B by PCR: NEGATIVE
SARS Coronavirus 2 by RT PCR: NEGATIVE

## 2019-09-09 LAB — COMPREHENSIVE METABOLIC PANEL
ALT: 185 U/L — ABNORMAL HIGH (ref 0–44)
AST: 310 U/L — ABNORMAL HIGH (ref 15–41)
Albumin: 3.6 g/dL (ref 3.5–5.0)
Alkaline Phosphatase: 87 U/L (ref 38–126)
Anion gap: 13 (ref 5–15)
BUN: 17 mg/dL (ref 6–20)
CO2: 24 mmol/L (ref 22–32)
Calcium: 9.3 mg/dL (ref 8.9–10.3)
Chloride: 100 mmol/L (ref 98–111)
Creatinine, Ser: 1.61 mg/dL — ABNORMAL HIGH (ref 0.61–1.24)
GFR calc Af Amer: 57 mL/min — ABNORMAL LOW (ref >60)
GFR calc non Af Amer: 49 mL/min — ABNORMAL LOW (ref >60)
Glucose, Bld: 149 mg/dL — ABNORMAL HIGH (ref 70–99)
Potassium: 4.3 mmol/L (ref 3.5–5.1)
Sodium: 137 mmol/L (ref 135–145)
Total Bilirubin: 0.7 mg/dL (ref 0.3–1.2)
Total Protein: 7 g/dL (ref 6.5–8.1)

## 2019-09-09 LAB — CBC
HCT: 45 % (ref 39.0–52.0)
Hemoglobin: 15.2 g/dL (ref 13.0–17.0)
MCH: 33.6 pg (ref 26.0–34.0)
MCHC: 33.8 g/dL (ref 30.0–36.0)
MCV: 99.3 fL (ref 80.0–100.0)
Platelets: 287 10*3/uL (ref 150–400)
RBC: 4.53 MIL/uL (ref 4.22–5.81)
RDW: 14 % (ref 11.5–15.5)
WBC: 10.6 10*3/uL — ABNORMAL HIGH (ref 4.0–10.5)
nRBC: 0 % (ref 0.0–0.2)

## 2019-09-09 LAB — CDS SEROLOGY

## 2019-09-09 LAB — I-STAT CHEM 8, ED
BUN: 17 mg/dL (ref 6–20)
Calcium, Ion: 1.21 mmol/L (ref 1.15–1.40)
Chloride: 102 mmol/L (ref 98–111)
Creatinine, Ser: 1.5 mg/dL — ABNORMAL HIGH (ref 0.61–1.24)
Glucose, Bld: 137 mg/dL — ABNORMAL HIGH (ref 70–99)
HCT: 47 % (ref 39.0–52.0)
Hemoglobin: 16 g/dL (ref 13.0–17.0)
Potassium: 4.3 mmol/L (ref 3.5–5.1)
Sodium: 137 mmol/L (ref 135–145)
TCO2: 27 mmol/L (ref 22–32)

## 2019-09-09 LAB — SAMPLE TO BLOOD BANK

## 2019-09-09 LAB — ETHANOL: Alcohol, Ethyl (B): <10 mg/dL (ref <10)

## 2019-09-09 LAB — LACTIC ACID, PLASMA: Lactic Acid, Venous: 2.2 mmol/L (ref 0.5–1.9)

## 2019-09-09 LAB — PROTIME-INR
INR: 1 (ref 0.8–1.2)
Prothrombin Time: 13.3 seconds (ref 11.4–15.2)

## 2019-09-09 LAB — MRSA PCR SCREENING: MRSA by PCR: NEGATIVE

## 2019-09-09 LAB — HIV ANTIBODY (ROUTINE TESTING W REFLEX): HIV Screen 4th Generation wRfx: NONREACTIVE

## 2019-09-09 MED ORDER — GABAPENTIN 100 MG PO CAPS
100.0000 mg | ORAL_CAPSULE | Freq: Three times a day (TID) | ORAL | Status: DC
  Administered 2019-09-09 – 2019-09-14 (×16): 100 mg via ORAL
  Filled 2019-09-09 (×16): qty 1

## 2019-09-09 MED ORDER — METHOCARBAMOL 1000 MG/10ML IJ SOLN
1000.0000 mg | Freq: Three times a day (TID) | INTRAVENOUS | Status: DC | PRN
  Filled 2019-09-09 (×2): qty 10

## 2019-09-09 MED ORDER — METOPROLOL TARTRATE 5 MG/5ML IV SOLN: 5.0000 mg | Freq: Four times a day (QID) | INTRAVENOUS | Status: DC | PRN

## 2019-09-09 MED ORDER — ONDANSETRON HCL 4 MG/2ML IJ SOLN: 4.0000 mg | Freq: Four times a day (QID) | INTRAMUSCULAR | Status: DC | PRN

## 2019-09-09 MED ORDER — FENTANYL CITRATE (PF) 100 MCG/2ML IJ SOLN
INTRAMUSCULAR | Status: AC
  Filled 2019-09-09: qty 2

## 2019-09-09 MED ORDER — SODIUM CHLORIDE 0.9 % IV SOLN
INTRAVENOUS | Status: AC | PRN
  Administered 2019-09-09: 999 mL/h via INTRAVENOUS

## 2019-09-09 MED ORDER — OXYCODONE HCL 5 MG PO TABS
10.0000 mg | ORAL_TABLET | ORAL | Status: DC | PRN
  Administered 2019-09-09 – 2019-09-11 (×8): 10 mg via ORAL
  Filled 2019-09-09 (×8): qty 2

## 2019-09-09 MED ORDER — LIDOCAINE HCL (PF) 1 % IJ SOLN
10.0000 mL | Freq: Once | INTRAMUSCULAR | Status: AC
  Administered 2019-09-09: 13:00:00 10 mL
  Filled 2019-09-09: qty 10

## 2019-09-09 MED ORDER — SODIUM CHLORIDE 0.9 % IV SOLN: INTRAVENOUS | Status: DC

## 2019-09-09 MED ORDER — HYDROMORPHONE HCL 1 MG/ML IJ SOLN
1.0000 mg | INTRAMUSCULAR | Status: DC | PRN
  Administered 2019-09-09 – 2019-09-11 (×7): 1 mg via INTRAVENOUS
  Filled 2019-09-09 (×7): qty 1

## 2019-09-09 MED ORDER — FENTANYL CITRATE (PF) 100 MCG/2ML IJ SOLN
100.0000 ug | Freq: Once | INTRAMUSCULAR | Status: AC
  Administered 2019-09-09: 13:00:00 100 ug via INTRAVENOUS

## 2019-09-09 MED ORDER — ACETAMINOPHEN 325 MG PO TABS
650.0000 mg | ORAL_TABLET | Freq: Four times a day (QID) | ORAL | Status: DC
  Administered 2019-09-10 – 2019-09-11 (×6): 650 mg via ORAL
  Filled 2019-09-09 (×6): qty 2

## 2019-09-09 MED ORDER — ENOXAPARIN SODIUM 40 MG/0.4ML ~~LOC~~ SOLN
40.0000 mg | SUBCUTANEOUS | Status: DC
  Administered 2019-09-10 – 2019-09-14 (×5): 40 mg via SUBCUTANEOUS
  Filled 2019-09-09 (×5): qty 0.4

## 2019-09-09 MED ORDER — OXYCODONE HCL 5 MG PO TABS: 5.0000 mg | ORAL_TABLET | ORAL | Status: DC | PRN

## 2019-09-09 MED ORDER — DIPHENHYDRAMINE HCL 50 MG/ML IJ SOLN
25.0000 mg | Freq: Four times a day (QID) | INTRAMUSCULAR | Status: DC | PRN
  Administered 2019-09-09 – 2019-09-11 (×3): 25 mg via INTRAVENOUS
  Filled 2019-09-09 (×3): qty 1

## 2019-09-09 MED ORDER — ONDANSETRON 4 MG PO TBDP: 4.0000 mg | ORAL_TABLET | Freq: Four times a day (QID) | ORAL | Status: DC | PRN

## 2019-09-09 NOTE — ED Triage Notes (Signed)
Pt bib ems after sliding approx 20 ft down a ladder and then falling the remainder 71ft. Pt landed on his back. Initially pt in tripod and tachypnic. Given 5mg  albuterol for wheezing. Pt c/o lumbar pain. Cleared spinal clearance and pt refused ccollar with ems. Crepitus to L lower posterior ribs. Lungs clear bilaterally. NRB on arrival for comfort at 96% 15L. fentanyl given en route.  112/78 HR 80 30 RR improved to 16 RR on NRB GCS 15

## 2019-09-09 NOTE — ED Notes (Signed)
Pt denied needing this RN to call anyone in his family for an update.

## 2019-09-09 NOTE — ED Notes (Addendum)
TRN note:  Attempted to call report to 4NP, RN unavailable at this time, will try again.  [place], [place] [phone]

## 2019-09-09 NOTE — Progress Notes (Signed)
Chaplain responded to Level 2 then upgraded to Level One. Patient moved from Room 19 to Trauma A. Patient unavailable as procedure underway. Will continue to be available. Rev. Lynnell Chad Pager [phone]

## 2019-09-09 NOTE — H&P (Addendum)
Keith Weber is an 49 y.o. male.   Chief Complaint: R rib pain HPI: 49yo M was up on a ladder cutting tree limbs when he fell 10 feet. No LOC. He came in as a non-trauma but was upgraded to a level 2. A CXR was done showing 40% R PTX and he was upgraded to a level 1 by the EDP. His BP has been normal. He C/O pain R ribs and with deep breaths. Also C/O back pain.  Past Medical History:  Diagnosis Date  . Asthma   . COPD (chronic obstructive pulmonary disease) ([place])   . GERD (gastroesophageal reflux disease)   . Sciatic nerve pain     Past Surgical History:  Procedure Laterality Date  . I & D EXTREMITY Right 06/16/2014   Procedure: IRRIGATION AND DEBRIDEMENT RIGHT THUMB AND FIRST FINGER AND PINNING ;  Surgeon: Roseanne Kaufman, MD;  Location: [place];  Service: Orthopedics;  Laterality: Right;  . IRRIGATION AND DEBRIDEMENT KNEE Right 06/16/2014   Procedure: IRRIGATION AND DEBRIDEMENT POSTERIOR KNEE;  Surgeon: Roseanne Kaufman, MD;  Location: [place];  Service: Orthopedics;  Laterality: Right;  . LACERATION REPAIR Right 06/16/2014   Procedure: REPAIR ODF THREE LACERATIONS POSTERIOR KNEE;  Surgeon: Roseanne Kaufman, MD;  Location: [place];  Service: Orthopedics;  Laterality: Right;    Family History  Family history unknown: Yes   Social History:  reports that he has been smoking cigars. He has been smoking about 0.00 packs per day for the past 31.00 years. He has never used smokeless tobacco. He reports current alcohol use of about 1.0 standard drinks of alcohol per week. He reports current drug use. Drug: Marijuana.  Allergies:  Allergies  Allergen Reactions  . Aspirin Anaphylaxis  . Peanut-Containing Drug Products Anaphylaxis  . Shellfish Allergy Anaphylaxis and Shortness Of Breath  . Amoxicillin Hives    Has patient had a PCN reaction causing immediate rash, facial/tongue/throat swelling, SOB or lightheadedness with hypotension:  Has patient had a PCN reaction causing severe rash involving mucus  membranes or skin necrosis:  Has patient had a PCN reaction that required hospitalization Yes Has patient had a PCN reaction occurring within the last 10 years: If all of the above answers are "NO", then may proceed with Cephalosporin use.   . Iodine Nausea And Vomiting and Swelling  . Kiwi Extract Swelling  . Naproxen Hives  . Latex Itching and Rash    (Not in a hospital admission)   Results for orders placed or performed during the hospital encounter of 09/09/19 (from the past 48 hour(s))  CBC     Status: Abnormal   Collection Time: 09/09/19 12:17 PM  Result Value Ref Range   WBC 10.6 (H) 4.0 - 10.5 K/uL   RBC 4.53 4.22 - 5.81 MIL/uL   Hemoglobin 15.2 13.0 - 17.0 g/dL   HCT 45.0 39.0 - 52.0 %   MCV 99.3 80.0 - 100.0 fL   MCH 33.6 26.0 - 34.0 pg   MCHC 33.8 30.0 - 36.0 g/dL   RDW 14.0 11.5 - 15.5 %   Platelets 287 150 - 400 K/uL   nRBC 0.0 0.0 - 0.2 %    Comment: Performed at [facility], [place] [address] [place], [place] [address]  Lactic acid, plasma     Status: Abnormal   Collection Time: 09/09/19 12:17 PM  Result Value Ref Range   Lactic Acid, Venous 2.2 (HH) 0.5 - 1.9 mmol/L    Comment: CRITICAL RESULT CALLED TO, READ  BACK BY AND VERIFIED WITH: RN Vevelyn Francois AT 1306 09/09/19 BY L BENFIELD Performed at [facility], [address] [place], [place] [address]   Protime-INR     Status: None   Collection Time: 09/09/19 12:17 PM  Result Value Ref Range   Prothrombin Time 13.3 11.4 - 15.2 seconds   INR 1.0 0.8 - 1.2    Comment: (NOTE) INR goal varies based on device and disease states. Performed at [facility], [address], [place], [place] [address]   Sample to Blood Bank     Status: None   Collection Time: 09/09/19 12:17 PM  Result Value Ref Range   Blood Bank Specimen SAMPLE AVAILABLE FOR TESTING    Sample Expiration      09/10/2019,2359 Performed at [facility], [address] [place], [place] [address]   I-stat chem 8, ED      Status: Abnormal   Collection Time: 09/09/19 12:27 PM  Result Value Ref Range   Sodium 137 135 - 145 mmol/L   Potassium 4.3 3.5 - 5.1 mmol/L   Chloride 102 98 - 111 mmol/L   BUN 17 6 - 20 mg/dL   Creatinine, Ser 3.53 (H) 0.61 - 1.24 mg/dL   Glucose, Bld 299 (H) 70 - 99 mg/dL   Calcium, Ion 2.42 6.83 - 1.40 mmol/L   TCO2 27 22 - 32 mmol/L   Hemoglobin 16.0 13.0 - 17.0 g/dL   HCT 41.9 62.2 - 29.7 %   DG Pelvis Portable  Result Date: 09/09/2019 CLINICAL DATA:  Pain after fall EXAM: PORTABLE PELVIS 1-2 VIEWS COMPARISON:  None. FINDINGS: Evaluation is limited due to unusual positioning. No left hip fractures identified on a single frontal view. Evaluation of the right hip is limited due to positioning. However, within this limitation, no definite right hip fracture. The pubic rami are intact. The acetabula appear to be grossly intact. The iliac cleans are intact. The SI joints are symmetric. No obvious sacral fracture. The lower lumbar spine is unremarkable. IMPRESSION: The study is limited due to unusual positioning but no fracture is identified. Electronically Signed   By: Gerome Sam III M.D   On: 09/09/2019 12:55   DG Chest Portable 1 View  Result Date: 09/09/2019 CLINICAL DATA:  Pain after fall EXAM: PORTABLE CHEST 1 VIEW COMPARISON:  Dec 12, 2018 FINDINGS: At least 3 right-sided rib fractures are noted. There is a moderate right-sided pneumothorax measuring at least 3.5 cm at the apex and up to 3.3 cm laterally. Opacity in the right mid lung favored to represent contusion. No left-sided pneumothorax. The left lung is clear. The heart, hila, and mediastinum are unremarkable. IMPRESSION: 1. Moderate right-sided pneumothorax as above. At least 3 right-sided rib fractures are noted. Extensive chest wall soft tissue emphysema is identified. 2. Probable contusion in the right mid lung. Findings called to Dr. Lowella Bandy Electronically Signed   By: Gerome Sam III M.D   On: 09/09/2019 13:01     Review of Systems  Constitutional: Negative.   HENT: Negative.   Eyes: Negative.   Respiratory: Positive for shortness of breath.   Cardiovascular: Positive for chest pain.  Gastrointestinal: Negative for abdominal pain.  Endocrine: Negative.   Genitourinary: Negative.   Musculoskeletal: Positive for back pain.  Allergic/Immunologic: Negative.   Neurological: Negative.   Hematological: Negative.   Psychiatric/Behavioral: Negative.     Blood pressure 123/79, pulse 81, temperature 98.5 F (36.9 C), temperature source Oral, resp. rate 19, height  5\' 11"  (1.803 m), weight 74.8 kg, SpO2 99 %. Physical Exam  Constitutional: He appears well-developed and well-nourished. No distress.  HENT:  Head: Normocephalic.  Right Ear: External ear normal.  Left Ear: External ear normal.  Mouth/Throat: Oropharynx is clear and moist.  Nasal abrasion  Eyes: Pupils are equal, round, and reactive to light. EOM are normal. No scleral icterus.  Neck: No tracheal deviation present.  Cardiovascular: Normal rate, regular rhythm and normal heart sounds.  Respiratory: Effort normal and breath sounds normal. No respiratory distress. He has no wheezes. He exhibits tenderness.  Crepitance and tenderness R ribs  GI: Soft. He exhibits no distension. There is no abdominal tenderness. There is no rebound and no guarding.  Musculoskeletal:        General: No deformity or edema.  Neurological: He is alert. He displays no atrophy and no tremor. He exhibits normal muscle tone. He displays no seizure activity. GCS eye subscore is 4. GCS verbal subscore is 5. GCS motor subscore is 6.  F/C to MAE  Skin: Skin is warm.  Psychiatric: He has a normal mood and affect.     Assessment/Plan Fall from ladder R Rib FX 4-10 with PTX - chest tube placed in ED Nasal abrasion CKD - CRT stable at 1.6  Admit to inpatient for pain control and pulmonary toilet.  , MD 09/09/2019, 1:08 PM

## 2019-09-09 NOTE — Procedures (Signed)
Chest Tube Insertion Procedure Note  Indications:  Clinically significant R pneumothorax  Pre-operative Diagnosis: R pneumothorax  Post-operative Diagnosis: R pneumothorax  Procedure Details  Verbal consent was obtained for the procedure, including sedation.  Risks of lung perforation, hemorrhage, arrhythmia, and adverse drug reaction were discussed.   After sterile skin prep, using standard technique, a 14 French tube was placed in the R anterior axillary line above the nipple level using Seldinger technique. Connected to Pleurevac. Sutured. Sterile dressing applied.  Findings: air  Estimated Blood Loss: min         Specimens:  None              Complications:  None; patient tolerated the procedure well.         Disposition: trauma bay         Condition: stable  Violeta Gelinas, MD, MPH, FACS Please use [email or web address] to contact on call provider

## 2019-09-09 NOTE — ED Notes (Addendum)
TRN note: Pt returned from CT via stretcher with this RN on CCM, pt switched to [place] 3L 02, tolerating well.  Dr. Janee Morn at bedside. OK for pt eat/drink [place] Lighter TRN [phone]

## 2019-09-09 NOTE — Progress Notes (Signed)
Orthopedic Tech Progress Note Patient Details:  Keith Weber [birth year] [id number] Patient was a level 2 trauma in room 19 then upgraded to a level 1 and I attended both.  Patient ID: Keith Weber, male   DOB: [birth year], 49 y.o.   MRN: [id number]   Keith Weber 09/09/2019, 12:39 PM

## 2019-09-10 ENCOUNTER — Inpatient Hospital Stay (HOSPITAL_COMMUNITY): Payer: Self-pay

## 2019-09-10 LAB — CBC
HCT: 40.6 % (ref 39.0–52.0)
Hemoglobin: 13.7 g/dL (ref 13.0–17.0)
MCH: 33.7 pg (ref 26.0–34.0)
MCHC: 33.7 g/dL (ref 30.0–36.0)
MCV: 99.8 fL (ref 80.0–100.0)
Platelets: 224 10*3/uL (ref 150–400)
RBC: 4.07 MIL/uL — ABNORMAL LOW (ref 4.22–5.81)
RDW: 13.9 % (ref 11.5–15.5)
WBC: 9.7 10*3/uL (ref 4.0–10.5)
nRBC: 0 % (ref 0.0–0.2)

## 2019-09-10 LAB — BASIC METABOLIC PANEL
Anion gap: 8 (ref 5–15)
BUN: 16 mg/dL (ref 6–20)
CO2: 25 mmol/L (ref 22–32)
Calcium: 8.8 mg/dL — ABNORMAL LOW (ref 8.9–10.3)
Chloride: 100 mmol/L (ref 98–111)
Creatinine, Ser: 1.27 mg/dL — ABNORMAL HIGH (ref 0.61–1.24)
GFR calc Af Amer: >60 mL/min (ref >60)
GFR calc non Af Amer: >60 mL/min (ref >60)
Glucose, Bld: 130 mg/dL — ABNORMAL HIGH (ref 70–99)
Potassium: 4.3 mmol/L (ref 3.5–5.1)
Sodium: 133 mmol/L — ABNORMAL LOW (ref 135–145)

## 2019-09-10 MED ORDER — ALBUTEROL SULFATE (2.5 MG/3ML) 0.083% IN NEBU
2.5000 mg | INHALATION_SOLUTION | Freq: Four times a day (QID) | RESPIRATORY_TRACT | Status: DC | PRN
  Administered 2019-09-10 (×2): 2.5 mg via RESPIRATORY_TRACT
  Filled 2019-09-10 (×2): qty 3

## 2019-09-10 MED ORDER — ALBUTEROL SULFATE (2.5 MG/3ML) 0.083% IN NEBU
2.5000 mg | INHALATION_SOLUTION | Freq: Four times a day (QID) | RESPIRATORY_TRACT | Status: DC
  Filled 2019-09-10: qty 3

## 2019-09-10 NOTE — Progress Notes (Signed)
Rehab Admissions Coordinator Note:  Patient was screened by Clois Dupes for appropriateness for an Inpatient Acute Rehab Consult per therapy recs. Only bed mobility due to pain,. I will follow his progress.Clois Dupes RN MSN 09/10/2019, 4:22 PM  I can be reached at [phone].

## 2019-09-10 NOTE — Progress Notes (Signed)
Subjective/Chief Complaint: Pain right chest and left shoulder   Objective: Vital signs in last 24 hours: Temp:  [98.1 F (36.7 C)-99.7 F (37.6 C)] 99.7 F (37.6 C) (01/31 0045) Pulse Rate:  [77-90] 87 (01/31 0045) Resp:  [17-30] 17 (01/31 0045) BP: (116-139)/(68-95) 125/80 (01/31 0045) SpO2:  [93 %-100 %] 97 % (01/31 0045) Weight:  [74.8 kg] 74.8 kg (01/30 1210) Last BM Date: 09/08/19  Intake/Output from previous day: 01/30 0701 - 01/31 0700 In: 2141.6 [P.O.:440; I.V.:1701.6] Out: 30 [Chest Tube:30] Intake/Output this shift: No intake/output data recorded.  General nad cv rrr Lungs clear bilaterally, right chest tube with minimal output no air leak abd soft nontender  Lab Results:  Recent Labs    09/09/19 1533 09/10/19 0325  WBC 15.7* 9.7  HGB 15.7 13.7  HCT 45.6 40.6  PLT 269 224   BMET Recent Labs    09/09/19 1217 09/09/19 1217 09/09/19 1227 09/09/19 1227 09/09/19 1533 09/10/19 0325  NA 137   < > 137  --   --  133*  K 4.3   < > 4.3  --   --  4.3  CL 100   < > 102  --   --  100  CO2 24  --   --   --   --  25  GLUCOSE 149*   < > 137*  --   --  130*  BUN 17   < > 17  --   --  16  CREATININE 1.61*   < > 1.50*   < > 1.51* 1.27*  CALCIUM 9.3  --   --   --   --  8.8*   < > = values in this interval not displayed.   PT/INR Recent Labs    09/09/19 1217  LABPROT 13.3  INR 1.0   ABG No results for input(s): PHART, HCO3 in the last 72 hours.  Invalid input(s): PCO2, PO2  Studies/Results: CT ABDOMEN PELVIS WO CONTRAST  Result Date: 09/09/2019 CLINICAL DATA:  Larey Seat off ladder while cutting trees. Right-sided chest and abdominal pain. Right pneumothorax. EXAM: CT CHEST, ABDOMEN AND PELVIS WITHOUT CONTRAST TECHNIQUE: Multidetector CT imaging of the chest, abdomen and pelvis was performed following the standard protocol without IV contrast. COMPARISON:  None. FINDINGS: CT CHEST FINDINGS Cardiovascular: No acute findings. Mediastinum/Lymph Nodes: No  evidence of mediastinal hematoma or pneumomediastinum. No masses or pathologically enlarged lymph nodes identified on this unenhanced exam. Lungs/Pleura: A right-sided chest tube is seen in place, and a small approximately 15% residual right pneumothorax is seen. Extensive subcutaneous emphysema seen in the right chest wall. Airspace disease in the posterior right upper and lower lobes is suspicious for pulmonary contusion. Mild dependent atelectasis seen in the left lung base. Musculoskeletal: Multiple displaced fractures are seen involving the right lateral 2nd through 11th ribs. CT ABDOMEN AND PELVIS FINDINGS Hepatobiliary: No parenchymal injury visualized on this unenhanced exam. Gallbladder is unremarkable. No evidence of biliary ductal dilatation. Pancreas: Unremarkable. No parenchymal injury seen on this unenhanced exam. Spleen: Within normal limits in size. No abnormality identified. Adrenals/Urinary Tract: No evidence of perinephric hematoma. No evidence of urolithiasis or hydronephrosis. Unremarkable appearance of bladder. Stomach/Bowel: No evidence of obstruction, inflammatory process, or abnormal fluid collections. No evidence of hemoperitoneum. Vascular/Lymphatic: No pathologically enlarged lymph nodes identified. No abdominal aortic aneurysm. Reproductive:  No masses or other significant abnormality. Other:  None. Musculoskeletal: No suspicious bone lesions identified. Bilateral L5 pars defects seen, with severe degenerative disc disease and grade  3 anterolisthesis measuring 20 mm at L5-S1. IMPRESSION: 1. Small residual right pneumothorax, with right chest tube in place. 2. Posterior right upper and lower lobe pulmonary contusion. 3. Multiple displaced fractures involving the right lateral 2nd through 11th ribs. 4. No evidence of abdominal organ injury or hemoperitoneum on this unenhanced exam. 5. Bilateral L5 pars defects, with grade 3 anterolisthesis and severe degenerative disc disease at L5-S1.  Electronically Signed   By: Marlaine Hind M.D.   On: 09/09/2019 13:41   CT Head Wo Contrast  Result Date: 09/09/2019 CLINICAL DATA:  Fall from ladder EXAM: CT HEAD WITHOUT CONTRAST CT CERVICAL SPINE WITHOUT CONTRAST TECHNIQUE: Multidetector CT imaging of the head and cervical spine was performed following the standard protocol without intravenous contrast. Multiplanar CT image reconstructions of the cervical spine were also generated. COMPARISON:  07/05/2019 FINDINGS: CT HEAD FINDINGS Brain: No evidence of acute infarction, hemorrhage, hydrocephalus, extra-axial collection or mass lesion/mass effect. Vascular: No hyperdense vessel or unexpected calcification. Skull: Normal. Negative for fracture or focal lesion. Sinuses/Orbits: Chronic mucosal thickening within the bilateral maxillary sinuses and ethmoid air cells. Orbital structures intact. Other: Minimally displaced bilateral nasal bone fractures (series 4, image 71) with overlying soft tissue swelling. CT CERVICAL SPINE FINDINGS Alignment: Facet joints are normally aligned without traumatic listhesis. Dens and lateral masses aligned. Skull base and vertebrae: Cervical vertebral bodies are intact without acute fracture. Nondisplaced fracture of the posterior aspect of the right first rib at the costovertebral junction (series 15, image 73). Additional nondisplaced fracture of the posterior left first rib at the costovertebral junction (series 15, image 71; series 13, image 45). Soft tissues and spinal canal: No prevertebral fluid or swelling. No visible canal hematoma. Disc levels: Mild intervertebral disc height loss with degenerative endplate spurring most pronounced at C6-7. No advanced cervical facet arthropathy. Upper chest: Partially visualized right-sided chest tube positioned at the medial aspect of the right lung apex. Trace right apical pneumothorax (series 10, image 85). There is right chest wall subcutaneous emphysema extending into the base of the  right neck. Other: None. IMPRESSION: 1. No acute intracranial process or skull fracture identified. 2. Minimally displaced bilateral nasal bone fractures. 3. No acute fracture or traumatic listhesis of the cervical spine. 4. Nondisplaced fractures of the posterior aspects of the bilateral first ribs at the costovertebral junction. 5. Right-sided chest tube positioned at the medial aspect of the right lung apex. Trace residual right apical pneumothorax. 6. Right chest wall subcutaneous emphysema extending into the base of the right neck. Electronically Signed   By: Davina Poke D.O.   On: 09/09/2019 13:41   CT Chest Wo Contrast  Result Date: 09/09/2019 CLINICAL DATA:  [place] off ladder while cutting trees. Right-sided chest and abdominal pain. Right pneumothorax. EXAM: CT CHEST, ABDOMEN AND PELVIS WITHOUT CONTRAST TECHNIQUE: Multidetector CT imaging of the chest, abdomen and pelvis was performed following the standard protocol without IV contrast. COMPARISON:  None. FINDINGS: CT CHEST FINDINGS Cardiovascular: No acute findings. Mediastinum/Lymph Nodes: No evidence of mediastinal hematoma or pneumomediastinum. No masses or pathologically enlarged lymph nodes identified on this unenhanced exam. Lungs/Pleura: A right-sided chest tube is seen in place, and a small approximately 15% residual right pneumothorax is seen. Extensive subcutaneous emphysema seen in the right chest wall. Airspace disease in the posterior right upper and lower lobes is suspicious for pulmonary contusion. Mild dependent atelectasis seen in the left lung base. Musculoskeletal: Multiple displaced fractures are seen involving the right lateral 2nd through 11th ribs. CT ABDOMEN AND  PELVIS FINDINGS Hepatobiliary: No parenchymal injury visualized on this unenhanced exam. Gallbladder is unremarkable. No evidence of biliary ductal dilatation. Pancreas: Unremarkable. No parenchymal injury seen on this unenhanced exam. Spleen: Within normal limits in  size. No abnormality identified. Adrenals/Urinary Tract: No evidence of perinephric hematoma. No evidence of urolithiasis or hydronephrosis. Unremarkable appearance of bladder. Stomach/Bowel: No evidence of obstruction, inflammatory process, or abnormal fluid collections. No evidence of hemoperitoneum. Vascular/Lymphatic: No pathologically enlarged lymph nodes identified. No abdominal aortic aneurysm. Reproductive:  No masses or other significant abnormality. Other:  None. Musculoskeletal: No suspicious bone lesions identified. Bilateral L5 pars defects seen, with severe degenerative disc disease and grade 3 anterolisthesis measuring 20 mm at L5-S1. IMPRESSION: 1. Small residual right pneumothorax, with right chest tube in place. 2. Posterior right upper and lower lobe pulmonary contusion. 3. Multiple displaced fractures involving the right lateral 2nd through 11th ribs. 4. No evidence of abdominal organ injury or hemoperitoneum on this unenhanced exam. 5. Bilateral L5 pars defects, with grade 3 anterolisthesis and severe degenerative disc disease at L5-S1. Electronically Signed   By: Danae Orleans M.D.   On: 09/09/2019 13:41   CT Cervical Spine Wo Contrast  Result Date: 09/09/2019 CLINICAL DATA:  Fall from ladder EXAM: CT HEAD WITHOUT CONTRAST CT CERVICAL SPINE WITHOUT CONTRAST TECHNIQUE: Multidetector CT imaging of the head and cervical spine was performed following the standard protocol without intravenous contrast. Multiplanar CT image reconstructions of the cervical spine were also generated. COMPARISON:  07/05/2019 FINDINGS: CT HEAD FINDINGS Brain: No evidence of acute infarction, hemorrhage, hydrocephalus, extra-axial collection or mass lesion/mass effect. Vascular: No hyperdense vessel or unexpected calcification. Skull: Normal. Negative for fracture or focal lesion. Sinuses/Orbits: Chronic mucosal thickening within the bilateral maxillary sinuses and ethmoid air cells. Orbital structures intact. Other:  Minimally displaced bilateral nasal bone fractures (series 4, image 71) with overlying soft tissue swelling. CT CERVICAL SPINE FINDINGS Alignment: Facet joints are normally aligned without traumatic listhesis. Dens and lateral masses aligned. Skull base and vertebrae: Cervical vertebral bodies are intact without acute fracture. Nondisplaced fracture of the posterior aspect of the right first rib at the costovertebral junction (series 15, image 73). Additional nondisplaced fracture of the posterior left first rib at the costovertebral junction (series 15, image 71; series 13, image 45). Soft tissues and spinal canal: No prevertebral fluid or swelling. No visible canal hematoma. Disc levels: Mild intervertebral disc height loss with degenerative endplate spurring most pronounced at C6-7. No advanced cervical facet arthropathy. Upper chest: Partially visualized right-sided chest tube positioned at the medial aspect of the right lung apex. Trace right apical pneumothorax (series 10, image 85). There is right chest wall subcutaneous emphysema extending into the base of the right neck. Other: None. IMPRESSION: 1. No acute intracranial process or skull fracture identified. 2. Minimally displaced bilateral nasal bone fractures. 3. No acute fracture or traumatic listhesis of the cervical spine. 4. Nondisplaced fractures of the posterior aspects of the bilateral first ribs at the costovertebral junction. 5. Right-sided chest tube positioned at the medial aspect of the right lung apex. Trace residual right apical pneumothorax. 6. Right chest wall subcutaneous emphysema extending into the base of the right neck. Electronically Signed   By: Duanne Guess D.O.   On: 09/09/2019 13:41   DG Pelvis Portable  Result Date: 09/09/2019 CLINICAL DATA:  Pain after fall EXAM: PORTABLE PELVIS 1-2 VIEWS COMPARISON:  None. FINDINGS: Evaluation is limited due to unusual positioning. No left hip fractures identified on a single frontal  view.  Evaluation of the right hip is limited due to positioning. However, within this limitation, no definite right hip fracture. The pubic rami are intact. The acetabula appear to be grossly intact. The iliac cleans are intact. The SI joints are symmetric. No obvious sacral fracture. The lower lumbar spine is unremarkable. IMPRESSION: The study is limited due to unusual positioning but no fracture is identified. Electronically Signed   By: Gerome Sam III M.D   On: 09/09/2019 12:55   DG Chest Port 1 View  Result Date: 09/10/2019 CLINICAL DATA:  Follow-up pneumothorax EXAM: PORTABLE CHEST 1 VIEW COMPARISON:  09/09/2019 FINDINGS: Right chest tube remains in place at the medial apex. No visible pleural air. Small amount of pleural or extrapleural fluid. Mild mid and lower lung atelectasis on the right. New mild atelectasis the left lung base. Right lateral rib fractures as seen previously. IMPRESSION: No visible pleural air on the right. Small accumulation of pleural fluid. Similar appearance of right mid and lower lung atelectasis. Slight worsening of left base atelectasis. Electronically Signed   By: Paulina Fusi M.D.   On: 09/10/2019 07:24   DG Chest Portable 1 View  Result Date: 09/09/2019 CLINICAL DATA:  Chest tube placement. EXAM: PORTABLE CHEST 1 VIEW COMPARISON:  September 09, 2019 FINDINGS: A chest tube has been placed in the interval. The pigtail projects over the trachea, likely in the medial most aspect of the right apex. The patient may benefit from withdrawing the tube 5 or 6 cm. The right-sided pneumothorax is much smaller in the interval. A probable tiny right pneumothorax remains. Continued opacity in the right mid lung is likely contusion. Air in the right chest wall remains. Rib fractures are again noted. No other interval changes. IMPRESSION: 1. The distal pigtail the chest tube projects over the trachea, likely in the most medial aspect of the lung, just anterior or posterior to the  superior mediastinum. The patient may benefit from withdrawing 5 or 6 cm. 2. The pneumothorax has almost completely resolved. Continued air in the right chest wall. Continued right rib fractures. Opacity in the right mid lung is likely contusion. Electronically Signed   By: Gerome Sam III M.D   On: 09/09/2019 13:17   DG Chest Portable 1 View  Result Date: 09/09/2019 CLINICAL DATA:  Pain after fall EXAM: PORTABLE CHEST 1 VIEW COMPARISON:  Dec 12, 2018 FINDINGS: At least 3 right-sided rib fractures are noted. There is a moderate right-sided pneumothorax measuring at least 3.5 cm at the apex and up to 3.3 cm laterally. Opacity in the right mid lung favored to represent contusion. No left-sided pneumothorax. The left lung is clear. The heart, hila, and mediastinum are unremarkable. IMPRESSION: 1. Moderate right-sided pneumothorax as above. At least 3 right-sided rib fractures are noted. Extensive chest wall soft tissue emphysema is identified. 2. Probable contusion in the right mid lung. Findings called to Dr. Lowella Bandy Electronically Signed   By: Gerome Sam III M.D   On: 09/09/2019 13:01    Anti-infectives: Anti-infectives (From admission, onward)   None      Assessment/Plan: Fall from ladder R Rib FX 4-10 with PTX - chest tube placed in ED, cxr without ptx today, will leave to suction until am tomorrow then water seal Nasal abrasion CKD - CRT stable at 1.27 this am lovenox scds  Emelia Loron 09/10/2019

## 2019-09-10 NOTE — ED Provider Notes (Signed)
[facility] Provider Note   CSN: [id number] Arrival date & time: 09/09/19  1202     History Chief Complaint  Patient presents with  . Fall    Keith Weber is a 49 y.o. male.  HPI Patient was working on a ladder was approximately 20 feet tall.  The patient started to fall and slid down the ladder to about a 6 feet height then fell backwards and struck his back.  He has severe pain in the right side of his back.  Patient reports he felt very short of breath.  On EMS arrival patient was sitting in tripod position and tachypneic.  Patient does have a history of asthma and EMS noted crepitus and wheezing.  Patient was given an albuterol treatment and 150 mics of fentanyl.  The patient refused placement of the cervical collar.  Patient is in pain and distressed but GCS is 15 and he is answering questions appropriately.  He denies he struck his head.  He denies headache or neck pain.  Patient denies he had numbness or paresthesia of the upper extremities or lower extremities.  He reports most of his pain is all in his back and his chest on the right side.  He reports it is severe.  Denies being on any blood thinners.  Patient arrives on a nonrebreather mask.    Past Medical History:  Diagnosis Date  . Asthma   . COPD (chronic obstructive pulmonary disease) (HCC)   . GERD (gastroesophageal reflux disease)   . Sciatic nerve pain     Patient Active Problem List   Diagnosis Date Noted  . Pneumothorax on right 09/09/2019  . CAP (community acquired pneumonia) 01/21/2016  . Right middle lobe pneumonia 01/21/2016  . Glucosuria with normal serum glucose 01/21/2016  . Lactic acidosis 01/21/2016  . CKD (chronic kidney disease) stage 3, GFR 30-59 ml/min 01/21/2016  . Cigarette smoker 06/12/2015  . COPD pfts pending/ still smoking  06/11/2015  . Asthma, chronic 10/16/2014  . Closed fracture of right thumb 06/16/2014    Past Surgical History:  Procedure Laterality  Date  . I & D EXTREMITY Right 06/16/2014   Procedure: IRRIGATION AND DEBRIDEMENT RIGHT THUMB AND FIRST FINGER AND PINNING ;  Surgeon: Dominica Severin, MD;  Location: [facility] OR;  Service: Orthopedics;  Laterality: Right;  . IRRIGATION AND DEBRIDEMENT KNEE Right 06/16/2014   Procedure: IRRIGATION AND DEBRIDEMENT POSTERIOR KNEE;  Surgeon: Dominica Severin, MD;  Location: [facility] OR;  Service: Orthopedics;  Laterality: Right;  . LACERATION REPAIR Right 06/16/2014   Procedure: REPAIR ODF THREE LACERATIONS POSTERIOR KNEE;  Surgeon: Dominica Severin, MD;  Location: [facility] OR;  Service: Orthopedics;  Laterality: Right;       Family History  Family history unknown: Yes    Social History   Tobacco Use  . Smoking status: Current Some Day Smoker    Packs/day: 0.00    Years: 31.00    Pack years: 0.00    Types: Cigars  . Smokeless tobacco: Never Used  Substance Use Topics  . Alcohol use: Yes    Alcohol/week: 1.0 standard drinks    Types: 1 Cans of beer per week    Comment: occ  . Drug use: Yes    Types: Marijuana    Home Medications Prior to Admission medications   Medication Sig Start Date End Date Taking? Authorizing Provider  albuterol (PROVENTIL HFA;VENTOLIN HFA) 108 (90 Base) MCG/ACT inhaler Inhale 2 puffs into the lungs every 6 (six) hours as  needed for wheezing or shortness of breath. 01/31/18  Yes Elvina Sidle, MD  doxycycline (VIBRAMYCIN) 100 MG capsule Take 1 capsule (100 mg total) by mouth 2 (two) times daily. 08/29/19  Yes Aberman, Caroline C, PA-C  fluticasone furoate-vilanterol (BREO ELLIPTA) 100-25 MCG/INH AEPB Inhale 1 puff into the lungs every morning. Patient taking differently: Inhale 1 puff into the lungs daily.  07/05/17  Yes Claiborne Rigg, NP  HYDROcodone-acetaminophen (NORCO/VICODIN) 5-325 MG tablet Take 1 tablet by mouth every 6 (six) hours as needed. Patient taking differently: Take 1 tablet by mouth every 6 (six) hours as needed for moderate pain.  07/05/19  Yes Aberman, Caroline  C, PA-C  ipratropium-albuterol (DUONEB) 0.5-2.5 (3) MG/3ML SOLN Take 3 mLs by nebulization every 6 (six) hours as needed. Patient taking differently: Take 3 mLs by nebulization every 6 (six) hours as needed (For shortness of breath).  01/31/18  Yes Elvina Sidle, MD  Multiple Vitamin (MULTIVITAMIN WITH MINERALS) TABS tablet Take 1 tablet by mouth daily.   Yes [provider]  chlorhexidine (PERIDEX) 0.12 % solution 15 mL swish and spit bid Patient not taking: Reported on 09/09/2019 04/29/19   Domenick Gong, MD  pantoprazole (PROTONIX) 40 MG tablet Take 1 tablet (40 mg total) by mouth daily. Patient not taking: Reported on 09/09/2019 01/31/18 05/01/18  Elvina Sidle, MD  varenicline (CHANTIX) 0.5 MG tablet Take 1 tablet (0.5 mg total) by mouth 2 (two) times daily. Patient not taking: Reported on 09/09/2019 01/31/18   Elvina Sidle, MD    Allergies    Aspirin, Peanut-containing drug products, Shellfish allergy, Amoxicillin, Iodine, Kiwi extract, Naproxen, and Latex  Review of Systems   Review of Systems 10 Systems reviewed and are negative for acute change except as noted in the HPI.  Physical Exam Updated Vital Signs BP 127/73 (BP Location: Left Arm)   Pulse 79   Temp 98.4 F (36.9 C)   Resp 20   Ht 5\' 11"  (1.803 m)   Wt 74.8 kg   SpO2 99%   BMI 23.01 kg/m   Physical Exam Constitutional:      Comments: Patient is alert.  GCS 15.  He is in a semirecumbent position with a nonrebreather mask.  Patient has moderate respiratory distress and is uncomfortable in appearance.  Thin and well-nourished and developed.  HENT:     Head:     Comments: No significant apparent contusions or abrasions to the skull or face.  Patient does have some dried blood at the naris.  No significant facial swelling or deformities.  Upper airway is patent.  No secretions in the airway. Eyes:     Extraocular Movements: Extraocular movements intact.     Pupils: Pupils are equal, round, and  reactive to light.  Neck:     Comments: Patient is denying C-spine tenderness to palpation.  No stridor.  Patient will not allow placement of cervical collar. Cardiovascular:     Comments: Heart regular.  Heart sounds slightly distant.  No gross rub murmur gallop. Pulmonary:     Comments: Crepitus present on the right side of the chest.  Severe pain with any motion involving the right chest from axilla to bottom of cage.  No visible lacerations or abrasions. Abdominal:     Comments: Abdomen is flat and nondistended.  Patient is not endorsing specific pain to palpation of the abdomen however he is keeping all of the musculature engaged as he is splinting for the chest.  Musculoskeletal:     Comments: No  significant extremity deformities.  Patient does not endorse significant pain to the feet ankles knees or hips.  He has a lot of pain with movement of the right upper extremity however this seems to be associated with his chest wall pain.  Patient was logrolled which was very painful for him.  He had so much pain in the posterior thorax that it was difficult to ascertain localizing pain of the thoracic spine.  No gross soft tissue injuries of abrasions or lacerations to the back or buttocks.  Skin:    General: Skin is warm and dry.  Neurological:     Comments: GCS 15.  Follows commands appropriately.  No focal motor deficits.  Denies loss of sensation x4 extremities.  Psychiatric:     Comments: Patient is in pain.  Mood situationally appropriate.     ED Results / Procedures / Treatments   Labs (all labs ordered are listed, but only abnormal results are displayed) Labs Reviewed  COMPREHENSIVE METABOLIC PANEL - Abnormal; Notable for the following components:      Result Value   Glucose, Bld 149 (*)    Creatinine, Ser 1.61 (*)    AST 310 (*)    ALT 185 (*)    GFR calc non Af Amer 49 (*)    GFR calc Af Amer 57 (*)    All other components within normal limits  CBC - Abnormal; Notable for  the following components:   WBC 10.6 (*)    All other components within normal limits  LACTIC ACID, PLASMA - Abnormal; Notable for the following components:   Lactic Acid, Venous 2.2 (*)    All other components within normal limits  CBC - Abnormal; Notable for the following components:   WBC 15.7 (*)    All other components within normal limits  CREATININE, SERUM - Abnormal; Notable for the following components:   Creatinine, Ser 1.51 (*)    GFR calc non Af Amer 53 (*)    All other components within normal limits  CBC - Abnormal; Notable for the following components:   RBC 4.07 (*)    All other components within normal limits  BASIC METABOLIC PANEL - Abnormal; Notable for the following components:   Sodium 133 (*)    Glucose, Bld 130 (*)    Creatinine, Ser 1.27 (*)    Calcium 8.8 (*)    All other components within normal limits  I-STAT CHEM 8, ED - Abnormal; Notable for the following components:   Creatinine, Ser 1.50 (*)    Glucose, Bld 137 (*)    All other components within normal limits  RESPIRATORY PANEL BY RT PCR (FLU A&B, COVID)  MRSA PCR SCREENING  CDS SEROLOGY  ETHANOL  PROTIME-INR  HIV ANTIBODY (ROUTINE TESTING W REFLEX)  URINALYSIS, ROUTINE W REFLEX MICROSCOPIC  SAMPLE TO BLOOD BANK    EKG None  Radiology CT ABDOMEN PELVIS WO CONTRAST  Result Date: 09/09/2019 CLINICAL DATA:  Larey Seat off ladder while cutting trees. Right-sided chest and abdominal pain. Right pneumothorax. EXAM: CT CHEST, ABDOMEN AND PELVIS WITHOUT CONTRAST TECHNIQUE: Multidetector CT imaging of the chest, abdomen and pelvis was performed following the standard protocol without IV contrast. COMPARISON:  None. FINDINGS: CT CHEST FINDINGS Cardiovascular: No acute findings. Mediastinum/Lymph Nodes: No evidence of mediastinal hematoma or pneumomediastinum. No masses or pathologically enlarged lymph nodes identified on this unenhanced exam. Lungs/Pleura: A right-sided chest tube is seen in place, and a small  approximately 15% residual right pneumothorax is seen. Extensive subcutaneous emphysema  seen in the right chest wall. Airspace disease in the posterior right upper and lower lobes is suspicious for pulmonary contusion. Mild dependent atelectasis seen in the left lung base. Musculoskeletal: Multiple displaced fractures are seen involving the right lateral 2nd through 11th ribs. CT ABDOMEN AND PELVIS FINDINGS Hepatobiliary: No parenchymal injury visualized on this unenhanced exam. Gallbladder is unremarkable. No evidence of biliary ductal dilatation. Pancreas: Unremarkable. No parenchymal injury seen on this unenhanced exam. Spleen: Within normal limits in size. No abnormality identified. Adrenals/Urinary Tract: No evidence of perinephric hematoma. No evidence of urolithiasis or hydronephrosis. Unremarkable appearance of bladder. Stomach/Bowel: No evidence of obstruction, inflammatory process, or abnormal fluid collections. No evidence of hemoperitoneum. Vascular/Lymphatic: No pathologically enlarged lymph nodes identified. No abdominal aortic aneurysm. Reproductive:  No masses or other significant abnormality. Other:  None. Musculoskeletal: No suspicious bone lesions identified. Bilateral L5 pars defects seen, with severe degenerative disc disease and grade 3 anterolisthesis measuring 20 mm at L5-S1. IMPRESSION: 1. Small residual right pneumothorax, with right chest tube in place. 2. Posterior right upper and lower lobe pulmonary contusion. 3. Multiple displaced fractures involving the right lateral 2nd through 11th ribs. 4. No evidence of abdominal organ injury or hemoperitoneum on this unenhanced exam. 5. Bilateral L5 pars defects, with grade 3 anterolisthesis and severe degenerative disc disease at L5-S1. Electronically Signed   By: Danae Orleans M.D.   On: 09/09/2019 13:41   CT Head Wo Contrast  Result Date: 09/09/2019 CLINICAL DATA:  Fall from ladder EXAM: CT HEAD WITHOUT CONTRAST CT CERVICAL SPINE WITHOUT  CONTRAST TECHNIQUE: Multidetector CT imaging of the head and cervical spine was performed following the standard protocol without intravenous contrast. Multiplanar CT image reconstructions of the cervical spine were also generated. COMPARISON:  07/05/2019 FINDINGS: CT HEAD FINDINGS Brain: No evidence of acute infarction, hemorrhage, hydrocephalus, extra-axial collection or mass lesion/mass effect. Vascular: No hyperdense vessel or unexpected calcification. Skull: Normal. Negative for fracture or focal lesion. Sinuses/Orbits: Chronic mucosal thickening within the bilateral maxillary sinuses and ethmoid air cells. Orbital structures intact. Other: Minimally displaced bilateral nasal bone fractures (series 4, image 71) with overlying soft tissue swelling. CT CERVICAL SPINE FINDINGS Alignment: Facet joints are normally aligned without traumatic listhesis. Dens and lateral masses aligned. Skull base and vertebrae: Cervical vertebral bodies are intact without acute fracture. Nondisplaced fracture of the posterior aspect of the right first rib at the costovertebral junction (series 15, image 73). Additional nondisplaced fracture of the posterior left first rib at the costovertebral junction (series 15, image 71; series 13, image 45). Soft tissues and spinal canal: No prevertebral fluid or swelling. No visible canal hematoma. Disc levels: Mild intervertebral disc height loss with degenerative endplate spurring most pronounced at C6-7. No advanced cervical facet arthropathy. Upper chest: Partially visualized right-sided chest tube positioned at the medial aspect of the right lung apex. Trace right apical pneumothorax (series 10, image 85). There is right chest wall subcutaneous emphysema extending into the base of the right neck. Other: None. IMPRESSION: 1. No acute intracranial process or skull fracture identified. 2. Minimally displaced bilateral nasal bone fractures. 3. No acute fracture or traumatic listhesis of the  cervical spine. 4. Nondisplaced fractures of the posterior aspects of the bilateral first ribs at the costovertebral junction. 5. Right-sided chest tube positioned at the medial aspect of the right lung apex. Trace residual right apical pneumothorax. 6. Right chest wall subcutaneous emphysema extending into the base of the right neck. Electronically Signed   By: Duanne Guess D.O.  On: 09/09/2019 13:41   CT Chest Wo Contrast  Result Date: 09/09/2019 CLINICAL DATA:  Larey SeatFell off ladder while cutting trees. Right-sided chest and abdominal pain. Right pneumothorax. EXAM: CT CHEST, ABDOMEN AND PELVIS WITHOUT CONTRAST TECHNIQUE: Multidetector CT imaging of the chest, abdomen and pelvis was performed following the standard protocol without IV contrast. COMPARISON:  None. FINDINGS: CT CHEST FINDINGS Cardiovascular: No acute findings. Mediastinum/Lymph Nodes: No evidence of mediastinal hematoma or pneumomediastinum. No masses or pathologically enlarged lymph nodes identified on this unenhanced exam. Lungs/Pleura: A right-sided chest tube is seen in place, and a small approximately 15% residual right pneumothorax is seen. Extensive subcutaneous emphysema seen in the right chest wall. Airspace disease in the posterior right upper and lower lobes is suspicious for pulmonary contusion. Mild dependent atelectasis seen in the left lung base. Musculoskeletal: Multiple displaced fractures are seen involving the right lateral 2nd through 11th ribs. CT ABDOMEN AND PELVIS FINDINGS Hepatobiliary: No parenchymal injury visualized on this unenhanced exam. Gallbladder is unremarkable. No evidence of biliary ductal dilatation. Pancreas: Unremarkable. No parenchymal injury seen on this unenhanced exam. Spleen: Within normal limits in size. No abnormality identified. Adrenals/Urinary Tract: No evidence of perinephric hematoma. No evidence of urolithiasis or hydronephrosis. Unremarkable appearance of bladder. Stomach/Bowel: No evidence of  obstruction, inflammatory process, or abnormal fluid collections. No evidence of hemoperitoneum. Vascular/Lymphatic: No pathologically enlarged lymph nodes identified. No abdominal aortic aneurysm. Reproductive:  No masses or other significant abnormality. Other:  None. Musculoskeletal: No suspicious bone lesions identified. Bilateral L5 pars defects seen, with severe degenerative disc disease and grade 3 anterolisthesis measuring 20 mm at L5-S1. IMPRESSION: 1. Small residual right pneumothorax, with right chest tube in place. 2. Posterior right upper and lower lobe pulmonary contusion. 3. Multiple displaced fractures involving the right lateral 2nd through 11th ribs. 4. No evidence of abdominal organ injury or hemoperitoneum on this unenhanced exam. 5. Bilateral L5 pars defects, with grade 3 anterolisthesis and severe degenerative disc disease at L5-S1. Electronically Signed   By: Danae OrleansJohn A Stahl M.D.   On: 09/09/2019 13:41   CT Cervical Spine Wo Contrast  Result Date: 09/09/2019 CLINICAL DATA:  Fall from ladder EXAM: CT HEAD WITHOUT CONTRAST CT CERVICAL SPINE WITHOUT CONTRAST TECHNIQUE: Multidetector CT imaging of the head and cervical spine was performed following the standard protocol without intravenous contrast. Multiplanar CT image reconstructions of the cervical spine were also generated. COMPARISON:  07/05/2019 FINDINGS: CT HEAD FINDINGS Brain: No evidence of acute infarction, hemorrhage, hydrocephalus, extra-axial collection or mass lesion/mass effect. Vascular: No hyperdense vessel or unexpected calcification. Skull: Normal. Negative for fracture or focal lesion. Sinuses/Orbits: Chronic mucosal thickening within the bilateral maxillary sinuses and ethmoid air cells. Orbital structures intact. Other: Minimally displaced bilateral nasal bone fractures (series 4, image 71) with overlying soft tissue swelling. CT CERVICAL SPINE FINDINGS Alignment: Facet joints are normally aligned without traumatic  listhesis. Dens and lateral masses aligned. Skull base and vertebrae: Cervical vertebral bodies are intact without acute fracture. Nondisplaced fracture of the posterior aspect of the right first rib at the costovertebral junction (series 15, image 73). Additional nondisplaced fracture of the posterior left first rib at the costovertebral junction (series 15, image 71; series 13, image 45). Soft tissues and spinal canal: No prevertebral fluid or swelling. No visible canal hematoma. Disc levels: Mild intervertebral disc height loss with degenerative endplate spurring most pronounced at C6-7. No advanced cervical facet arthropathy. Upper chest: Partially visualized right-sided chest tube positioned at the medial aspect of the right lung apex. Trace  right apical pneumothorax (series 10, image 85). There is right chest wall subcutaneous emphysema extending into the base of the right neck. Other: None. IMPRESSION: 1. No acute intracranial process or skull fracture identified. 2. Minimally displaced bilateral nasal bone fractures. 3. No acute fracture or traumatic listhesis of the cervical spine. 4. Nondisplaced fractures of the posterior aspects of the bilateral first ribs at the costovertebral junction. 5. Right-sided chest tube positioned at the medial aspect of the right lung apex. Trace residual right apical pneumothorax. 6. Right chest wall subcutaneous emphysema extending into the base of the right neck. Electronically Signed   By: Duanne GuessNicholas  Plundo D.O.   On: 09/09/2019 13:41   DG Pelvis Portable  Result Date: 09/09/2019 CLINICAL DATA:  Pain after fall EXAM: PORTABLE PELVIS 1-2 VIEWS COMPARISON:  None. FINDINGS: Evaluation is limited due to unusual positioning. No left hip fractures identified on a single frontal view. Evaluation of the right hip is limited due to positioning. However, within this limitation, no definite right hip fracture. The pubic rami are intact. The acetabula appear to be grossly intact.  The iliac cleans are intact. The SI joints are symmetric. No obvious sacral fracture. The lower lumbar spine is unremarkable. IMPRESSION: The study is limited due to unusual positioning but no fracture is identified. Electronically Signed   By: Gerome Samavid  Williams III M.D   On: 09/09/2019 12:55   DG Chest Port 1 View  Result Date: 09/10/2019 CLINICAL DATA:  Follow-up pneumothorax EXAM: PORTABLE CHEST 1 VIEW COMPARISON:  09/09/2019 FINDINGS: Right chest tube remains in place at the medial apex. No visible pleural air. Small amount of pleural or extrapleural fluid. Mild mid and lower lung atelectasis on the right. New mild atelectasis the left lung base. Right lateral rib fractures as seen previously. IMPRESSION: No visible pleural air on the right. Small accumulation of pleural fluid. Similar appearance of right mid and lower lung atelectasis. Slight worsening of left base atelectasis. Electronically Signed   By: Paulina FusiMark  Shogry M.D.   On: 09/10/2019 07:24   DG Chest Portable 1 View  Result Date: 09/09/2019 CLINICAL DATA:  Chest tube placement. EXAM: PORTABLE CHEST 1 VIEW COMPARISON:  September 09, 2019 FINDINGS: A chest tube has been placed in the interval. The pigtail projects over the trachea, likely in the medial most aspect of the right apex. The patient may benefit from withdrawing the tube 5 or 6 cm. The right-sided pneumothorax is much smaller in the interval. A probable tiny right pneumothorax remains. Continued opacity in the right mid lung is likely contusion. Air in the right chest wall remains. Rib fractures are again noted. No other interval changes. IMPRESSION: 1. The distal pigtail the chest tube projects over the trachea, likely in the most medial aspect of the lung, just anterior or posterior to the superior mediastinum. The patient may benefit from withdrawing 5 or 6 cm. 2. The pneumothorax has almost completely resolved. Continued air in the right chest wall. Continued right rib fractures. Opacity  in the right mid lung is likely contusion. Electronically Signed   By: Gerome Samavid  Williams III M.D   On: 09/09/2019 13:17   DG Chest Portable 1 View  Result Date: 09/09/2019 CLINICAL DATA:  Pain after fall EXAM: PORTABLE CHEST 1 VIEW COMPARISON:  Dec 12, 2018 FINDINGS: At least 3 right-sided rib fractures are noted. There is a moderate right-sided pneumothorax measuring at least 3.5 cm at the apex and up to 3.3 cm laterally. Opacity in the right mid lung favored to  represent contusion. No left-sided pneumothorax. The left lung is clear. The heart, hila, and mediastinum are unremarkable. IMPRESSION: 1. Moderate right-sided pneumothorax as above. At least 3 right-sided rib fractures are noted. Extensive chest wall soft tissue emphysema is identified. 2. Probable contusion in the right mid lung. Findings called to Dr. Dorothyann Gibbs Electronically Signed   By: Dorise Bullion III M.D   On: 09/09/2019 13:01   DG Shoulder Left Port  Result Date: 09/10/2019 CLINICAL DATA:  Fall from ladder 09/09/2019.  Left shoulder pain. EXAM: LEFT SHOULDER COMPARISON:  Chest CT 09/09/2019 FINDINGS: Mild widening of the [facility] joint. Mild lucency over the superior aspect of the scapula/scapular spine compatible with known fracture in this location on recent CT. Acute lateral left eighth and ninth rib fractures. IMPRESSION: 1. Evidence of fracture along the superior left scapula/scapular spine. Displaced left lateral eighth and ninth rib fractures. 2.  Widened AC joint suggesting grade 2 separation. Electronically Signed   By: Marin Olp M.D.   On: 09/10/2019 10:13   DG Humerus Left  Result Date: 09/10/2019 CLINICAL DATA:  Lateral fall 09/09/2019 EXAM: LEFT HUMERUS - 2+ VIEW COMPARISON:  Left shoulder series same day. FINDINGS: No evidence of left humerus fracture. Other findings as described on patient's left shoulder films unchanged. IMPRESSION: No left humeral fracture. Electronically Signed   By: Marin Olp M.D.   On: 09/10/2019  10:14    Procedures Procedures (including critical care time) CRITICAL CARE Performed by: Charlesetta Shanks   Total critical care time:20 minutes  Critical care time was exclusive of separately billable procedures and treating other patients.  Critical care was necessary to treat or prevent imminent or life-threatening deterioration.  Critical care was time spent personally by me on the following activities: development of treatment plan with patient and/or surrogate as well as nursing, discussions with consultants, evaluation of patient's response to treatment, examination of patient, obtaining history from patient or surrogate, ordering and performing treatments and interventions, ordering and review of laboratory studies, ordering and review of radiographic studies, pulse oximetry and re-evaluation of patient's condition.  FAST BEDSIDE US Indication: Trauma  4 Views obtained: Splenorenal, Morrison's Pouch, Retrovesical, Pericardial No free fluid in abdomen No pericardial effusion: view limited  I personally performed and interrepreted the images Medications Ordered in ED Medications  enoxaparin (LOVENOX) injection 40 mg (40 mg Subcutaneous Given 09/10/19 1011)  oxyCODONE (Oxy IR/ROXICODONE) immediate release tablet 5 mg (has no administration in time range)  oxyCODONE (Oxy IR/ROXICODONE) immediate release tablet 10 mg (10 mg Oral Given 09/10/19 1020)  HYDROmorphone (DILAUDID) injection 1 mg (1 mg Intravenous Given 09/10/19 0355)  ondansetron (ZOFRAN-ODT) disintegrating tablet 4 mg (has no administration in time range)    Or  ondansetron (ZOFRAN) injection 4 mg (has no administration in time range)  metoprolol tartrate (LOPRESSOR) injection 5 mg (has no administration in time range)  gabapentin (NEURONTIN) capsule 100 mg (100 mg Oral Given 09/10/19 1011)  acetaminophen (TYLENOL) tablet 650 mg (650 mg Oral Given 09/10/19 0620)  methocarbamol (ROBAXIN) 1,000 mg in dextrose 5 % 100 mL  IVPB (has no administration in time range)  diphenhydrAMINE (BENADRYL) injection 25 mg (25 mg Intravenous Given 09/10/19 0620)  albuterol (PROVENTIL) (2.5 MG/3ML) 0.083% nebulizer solution 2.5 mg (2.5 mg Inhalation Given 09/10/19 1103)  lidocaine (PF) (XYLOCAINE) 1 % injection 10 mL (10 mLs Infiltration Given by Other 09/09/19 1244)  fentaNYL (SUBLIMAZE) injection 100 mcg (100 mcg Intravenous Given 09/09/19 1231)  0.9 %  sodium chloride infusion (999 mL/hr Intravenous  New Bag/Given 09/09/19 1233)    ED Course  I have reviewed the triage vital signs and the nursing notes.  Pertinent labs & imaging results that were available during my care of the patient were reviewed by me and considered in my medical decision making (see chart for details).    MDM Rules/Calculators/A&P                     Consult: Patient arrives via EMS.  I assessed the patient and after determining multiple rib fractures with approximately 40% pneumothorax requiring oxygen and mechanism of significant injury, upgraded patient from level 2 to level 1 trauma.  Assessed and treated by Dr. Violeta Gelinas trauma.  Patient presented as outlined with a fall from height and significant chest trauma with pneumothorax, moderate respiratory distress and splinting. My FAST exam did not identify any gross amount of intra-abdominal fluid.  Patient's neurologic status was stable.  GCS was 15 and no evident extremity neurologic deficits.  Patient would not allow for placement of cervical collar due to the amount of pain he was experiencing in his chest and feeling of shortness of breath.  Chest tube placed by Dr. Janee Morn.  Patient was treated for pain with fentanyl. Final Clinical Impression(s) / ED Diagnoses Final diagnoses:  Pneumothorax on right  Closed fracture of multiple ribs of right side, initial encounter    Rx / DC Orders ED Discharge Orders    None       Arby Barrette, MD 09/10/19 1132

## 2019-09-10 NOTE — Evaluation (Signed)
Physical Therapy Evaluation Patient Details Name: Keith Weber MRN: [id number] DOB: [birth year] Today's Date: 09/10/2019   History of Present Illness  Pt is a 49 y.o. male who presents to the ED via EMS after falling from a ladder while trimming trees. He sustained R pneumothorax, R rib fxs, L scapular fx, and L AC joint separation.    Clinical Impression  Pt admitted with above diagnosis. PTA pt active and independent. On eval, he required max assist bed mobility. Mobility significantly limited by pain. Pt currently with functional limitations due to the deficits listed below (see PT Problem List). Pt will benefit from skilled PT to increase their independence and safety with mobility to allow discharge to the venue listed below.       Follow Up Recommendations CIR    Equipment Recommendations  Other (comment)(TBD)    Recommendations for Other Services       Precautions / Restrictions Precautions Precautions: Fall;Other (comment) Precaution Comments: chest tube R Required Braces or Orthoses: Sling Restrictions Other Position/Activity Restrictions: LUE WB not specified. Maintained NWB in sling during eval.      Mobility  Bed Mobility Overal bed mobility: Needs Assistance Bed Mobility: Supine to Sit;Sit to Supine     Supine to sit: Max assist;HOB elevated Sit to supine: Max assist;HOB elevated   General bed mobility comments: Unable to fully come to sitting EOB due to pain. Completed approx 50% of transition before requiring return to supine.  Transfers                 General transfer comment: unable due to pain  Ambulation/Gait                Stairs            Wheelchair Mobility    Modified Rankin (Stroke Patients Only)       Balance                                             Pertinent Vitals/Pain Pain Assessment: Faces Faces Pain Scale: Hurts whole lot Pain Location: R flank, LUE Pain Descriptors / Indicators:  Grimacing;Guarding;Sharp;Discomfort Pain Intervention(s): Limited activity within patient's tolerance;Monitored during session;Premedicated before session;Repositioned    Home Living Family/patient expects to be discharged to:: Private residence Living Arrangements: Other relatives Available Help at Discharge: Family;Available PRN/intermittently(family unable to provide physical assist) Type of Home: House Home Access: Ramped entrance     Home Layout: One level Home Equipment: None      Prior Function                 Hand Dominance   Dominant Hand: Right    Extremity/Trunk Assessment   Upper Extremity Assessment Upper Extremity Assessment: LUE deficits/detail LUE Deficits / Details: sling    Lower Extremity Assessment Lower Extremity Assessment: Overall WFL for tasks assessed       Communication   Communication: No difficulties  Cognition Arousal/Alertness: Awake/alert Behavior During Therapy: WFL for tasks assessed/performed Overall Cognitive Status: Within Functional Limits for tasks assessed                                        General Comments General comments (skin integrity, edema, etc.): Pt on 2L O2 throughout session.    Exercises  Assessment/Plan    PT Assessment Patient needs continued PT services  PT Problem List Decreased mobility;Decreased knowledge of precautions;Decreased activity tolerance;Pain;Decreased knowledge of use of DME;Cardiopulmonary status limiting activity       PT Treatment Interventions Therapeutic activities;Gait training;Therapeutic exercise;Patient/family education;Balance training;Functional mobility training    PT Goals (Current goals can be found in the Care Plan section)  Acute Rehab PT Goals Patient Stated Goal: decrease pain PT Goal Formulation: With patient Time For Goal Achievement: 09/24/19 Potential to Achieve Goals: Good    Frequency Min 5X/week   Barriers to discharge         Co-evaluation               AM-PAC PT "6 Clicks" Mobility  Outcome Measure Help needed turning from your back to your side while in a flat bed without using bedrails?: A Lot Help needed moving from lying on your back to sitting on the side of a flat bed without using bedrails?: A Lot Help needed moving to and from a bed to a chair (including a wheelchair)?: A Lot Help needed standing up from a chair using your arms (e.g., wheelchair or bedside chair)?: A Lot Help needed to walk in hospital room?: Total Help needed climbing 3-5 steps with a railing? : Total 6 Click Score: 10    End of Session Equipment Utilized During Treatment: Oxygen;Other (comment)(left sling) Activity Tolerance: Patient limited by pain Patient left: in bed;with call bell/phone within reach;with bed alarm set Nurse Communication: Mobility status PT Visit Diagnosis: Pain;Other abnormalities of gait and mobility (R26.89)    Time: 0938-1829 PT Time Calculation (min) (ACUTE ONLY): 26 min   Charges:   PT Evaluation $PT Eval Moderate Complexity: 1 Mod PT Treatments $Therapeutic Activity: 8-22 mins        Aida Raider, PT  Office # [phone] Pager [phone]   Ilda Foil 09/10/2019, 1:19 PM

## 2019-09-10 NOTE — Progress Notes (Signed)
Orthopedic Tech Progress Note Patient Details:  Keith Weber May 14, 1971 [id number] Applied when patient was in room 14. With help from [facility] the RN Ortho Devices Type of Ortho Device: Shoulder immobilizer Ortho Device/Splint Location: LUE Ortho Device/Splint Interventions: Application, Ordered   Post Interventions Patient Tolerated: Well Instructions Provided: Care of device, Adjustment of device   Donald Pore 09/10/2019, 12:34 PM

## 2019-09-10 NOTE — Progress Notes (Signed)
Patient has been itching and wanted albuterol. Dr. Lenna Gilford benedryl and albuterol inhaler

## 2019-09-10 NOTE — Consult Note (Signed)
Reason for Consult:   Left AC joint separation and Scapular fracture Referring Physician: Trauma  Keith Weber is an 49 y.o. male.  HPI: 49yo M was up on a ladder cutting tree limbs when he fell 10 feet. No LOC. He came in as a non-trauma but was upgraded to a level 2. A CXR was done showing 40% R PTX and he was upgraded to a level 1 by the EDP. His BP has been normal. He C/O pain R ribs and with deep breaths. Also C/O back pain.  Past Medical History:  Diagnosis Date  . Asthma   . COPD (chronic obstructive pulmonary disease) (HCC)   . GERD (gastroesophageal reflux disease)   . Sciatic nerve pain     Past Surgical History:  Procedure Laterality Date  . I & D EXTREMITY Right 06/16/2014   Procedure: IRRIGATION AND DEBRIDEMENT RIGHT THUMB AND FIRST FINGER AND PINNING ;  Surgeon: Dominica SeverinWilliam Gramig, MD;  Location: [facility] OR;  Service: Orthopedics;  Laterality: Right;  . IRRIGATION AND DEBRIDEMENT KNEE Right 06/16/2014   Procedure: IRRIGATION AND DEBRIDEMENT POSTERIOR KNEE;  Surgeon: Dominica SeverinWilliam Gramig, MD;  Location: [facility] OR;  Service: Orthopedics;  Laterality: Right;  . LACERATION REPAIR Right 06/16/2014   Procedure: REPAIR ODF THREE LACERATIONS POSTERIOR KNEE;  Surgeon: Dominica SeverinWilliam Gramig, MD;  Location: [facility] OR;  Service: Orthopedics;  Laterality: Right;    Family History  Family history unknown: Yes    Social History:  reports that he has been smoking cigars. He has been smoking about 0.00 packs per day for the past 31.00 years. He has never used smokeless tobacco. He reports current alcohol use of about 1.0 standard drinks of alcohol per week. He reports current drug use. Drug: Marijuana.  Allergies:  Allergies  Allergen Reactions  . Aspirin Anaphylaxis  . Peanut-Containing Drug Products Anaphylaxis  . Shellfish Allergy Anaphylaxis and Shortness Of Breath  . Amoxicillin Hives    Has patient had a PCN reaction causing immediate rash, facial/tongue/throat swelling, SOB or lightheadedness with  hypotension:  Has patient had a PCN reaction causing severe rash involving mucus membranes or skin necrosis:  Has patient had a PCN reaction that required hospitalization Yes Has patient had a PCN reaction occurring within the last 10 years: If all of the above answers are "NO", then may proceed with Cephalosporin use.   . Iodine Nausea And Vomiting and Swelling  . Kiwi Extract Swelling  . Naproxen Hives  . Latex Itching and Rash     Results for orders placed or performed during the hospital encounter of 09/09/19 (from the past 48 hour(s))  CDS serology     Status: None   Collection Time: 09/09/19 12:17 PM  Result Value Ref Range   CDS serology specimen      SPECIMEN WILL BE HELD FOR 14 DAYS IF TESTING IS REQUIRED    Comment: SPECIMEN WILL BE HELD FOR 14 DAYS IF TESTING IS REQUIRED SPECIMEN WILL BE HELD FOR 14 DAYS IF TESTING IS REQUIRED Performed at [facility], [address] [place], [place] [address]   Comprehensive metabolic panel     Status: Abnormal   Collection Time: 09/09/19 12:17 PM  Result Value Ref Range   Sodium 137 135 - 145 mmol/L   Potassium 4.3 3.5 - 5.1 mmol/L   Chloride 100 98 - 111 mmol/L   CO2 24 22 - 32 mmol/L   Glucose, Bld 149 (H) 70 - 99 mg/dL   BUN 17 6 - 20 mg/dL  Creatinine, Ser 1.61 (H) 0.61 - 1.24 mg/dL   Calcium 9.3 8.9 - 86.7 mg/dL   Total Protein 7.0 6.5 - 8.1 g/dL   Albumin 3.6 3.5 - 5.0 g/dL   AST 672 (H) 15 - 41 U/L   ALT 185 (H) 0 - 44 U/L   Alkaline Phosphatase 87 38 - 126 U/L   Total Bilirubin 0.7 0.3 - 1.2 mg/dL   GFR calc non Af Amer 49 (L) >60 mL/min   GFR calc Af Amer 57 (L) >60 mL/min   Anion gap 13 5 - 15    Comment: Performed at [facility], [address] [place], [place] [address]  CBC     Status: Abnormal   Collection Time: 09/09/19 12:17 PM  Result Value Ref Range   WBC 10.6 (H) 4.0 - 10.5 K/uL   RBC 4.53 4.22 - 5.81 MIL/uL   Hemoglobin 15.2 13.0 - 17.0 g/dL   HCT 96.2 83.6 - 62.9 %   MCV 99.3 80.0 -  100.0 fL   MCH 33.6 26.0 - 34.0 pg   MCHC 33.8 30.0 - 36.0 g/dL   RDW 47.6 54.6 - 50.3 %   Platelets 287 150 - 400 K/uL   nRBC 0.0 0.0 - 0.2 %    Comment: Performed at [facility], [address] [place], [place] [address]  Ethanol     Status: None   Collection Time: 09/09/19 12:17 PM  Result Value Ref Range   Alcohol, Ethyl (B) <10 <10 mg/dL    Comment: (NOTE) Lowest detectable limit for serum alcohol is 10 mg/dL. For medical purposes only. Performed at [facility], [address] [place], [place] [address]   Lactic acid, plasma     Status: Abnormal   Collection Time: 09/09/19 12:17 PM  Result Value Ref Range   Lactic Acid, Venous 2.2 (HH) 0.5 - 1.9 mmol/L    Comment: CRITICAL RESULT CALLED TO, READ BACK BY AND VERIFIED WITH: RN Vevelyn Francois AT 1306 09/09/19 BY L BENFIELD Performed at [facility], [address] [place], [place] [address]   Protime-INR     Status: None   Collection Time: 09/09/19 12:17 PM  Result Value Ref Range   Prothrombin Time 13.3 11.4 - 15.2 seconds   INR 1.0 0.8 - 1.2    Comment: (NOTE) INR goal varies based on device and disease states. Performed at [facility], [address] [place], [place] [address]   Sample to Blood Bank     Status: None   Collection Time: 09/09/19 12:17 PM  Result Value Ref Range   Blood Bank Specimen SAMPLE AVAILABLE FOR TESTING    Sample Expiration      09/10/2019,2359 Performed at [facility], [address] [place], [place] [address]   I-stat chem 8, ED     Status: Abnormal   Collection Time: 09/09/19 12:27 PM  Result Value Ref Range   Sodium 137 135 - 145 mmol/L   Potassium 4.3 3.5 - 5.1 mmol/L   Chloride 102 98 - 111 mmol/L   BUN 17 6 - 20 mg/dL   Creatinine, Ser 1.63 (H) 0.61 - 1.24 mg/dL   Glucose, Bld 846 (H) 70 - 99 mg/dL   Calcium, Ion 6.59 9.35 - 1.40 mmol/L   TCO2 27 22 - 32 mmol/L   Hemoglobin 16.0 13.0 - 17.0 g/dL   HCT 70.1 77.9 - 39.0 %  Respiratory Panel by RT  PCR (  Flu A&B, Covid) - Nasopharyngeal Swab     Status: None   Collection Time: 09/09/19 12:30 PM   Specimen: Nasopharyngeal Swab  Result Value Ref Range   SARS Coronavirus 2 by RT PCR NEGATIVE NEGATIVE    Comment: (NOTE) SARS-CoV-2 target nucleic acids are NOT DETECTED. The SARS-CoV-2 RNA is generally detectable in upper respiratoy specimens during the acute phase of infection. The lowest concentration of SARS-CoV-2 viral copies this assay can detect is 131 copies/mL. A negative result does not preclude SARS-Cov-2 infection and should not be used as the sole basis for treatment or other patient management decisions. A negative result may occur with  improper specimen collection/handling, submission of specimen other than nasopharyngeal swab, presence of viral mutation(s) within the areas targeted by this assay, and inadequate number of viral copies (<131 copies/mL). A negative result must be combined with clinical observations, patient history, and epidemiological information. The expected result is Negative. Fact Sheet for Patients:  https://www.moore.com/https://www.fda.gov/media/142436/download Fact Sheet for Healthcare Providers:  https://www.young.biz/https://www.fda.gov/media/142435/download This test is not yet ap proved or cleared by the [place] FDA and  has been authorized for detection and/or diagnosis of SARS-CoV-2 by FDA under an Emergency Use Authorization (EUA). This EUA will remain  in effect (meaning this test can be used) for the duration of the COVID-19 declaration under Section 564(b)(1) of the Act, 21 U.S.C. section 360bbb-3(b)(1), unless the authorization is terminated or revoked sooner.    Influenza A by PCR NEGATIVE NEGATIVE   Influenza B by PCR NEGATIVE NEGATIVE    Comment: (NOTE) The Xpert Xpress SARS-CoV-2/FLU/RSV assay is intended as an aid in  the diagnosis of influenza from Nasopharyngeal swab specimens and  should not be used as a sole basis for treatment. Nasal washings and  aspirates  are unacceptable for Xpert Xpress SARS-CoV-2/FLU/RSV  testing. Fact Sheet for Patients: https://www.moore.com/https://www.fda.gov/media/142436/download Fact Sheet for Healthcare Providers: https://www.young.biz/https://www.fda.gov/media/142435/download This test is not yet approved or cleared by the [place] FDA and  has been authorized for detection and/or diagnosis of SARS-CoV-2 by  FDA under an Emergency Use Authorization (EUA). This EUA will remain  in effect (meaning this test can be used) for the duration of the  Covid-19 declaration under Section 564(b)(1) of the Act, 21  U.S.C. section 360bbb-3(b)(1), unless the authorization is  terminated or revoked. Performed at [facility], [address] [place], [place] [address]   HIV Antibody (routine testing w rflx)     Status: None   Collection Time: 09/09/19  3:33 PM  Result Value Ref Range   HIV Screen 4th Generation wRfx NON REACTIVE NON REACTIVE    Comment: Performed at [facility], [address] [place], [place] [address]  CBC     Status: Abnormal   Collection Time: 09/09/19  3:33 PM  Result Value Ref Range   WBC 15.7 (H) 4.0 - 10.5 K/uL   RBC 4.63 4.22 - 5.81 MIL/uL   Hemoglobin 15.7 13.0 - 17.0 g/dL   HCT 82.945.6 56.239.0 - 13.052.0 %   MCV 98.5 80.0 - 100.0 fL   MCH 33.9 26.0 - 34.0 pg   MCHC 34.4 30.0 - 36.0 g/dL   RDW 86.513.9 78.411.5 - 69.615.5 %   Platelets 269 150 - 400 K/uL   nRBC 0.0 0.0 - 0.2 %    Comment: Performed at [facility], [address] [place], [place] [address]  Creatinine, serum     Status: Abnormal   Collection Time: 09/09/19  3:33 PM  Result Value Ref Range   Creatinine, Ser 1.51 (H) 0.61 - 1.24 mg/dL   GFR calc non Af Amer 53 (L) >60 mL/min   GFR calc Af Amer >60 >60 mL/min    Comment: Performed at [facility], [address] [place], [place] [address]  MRSA PCR Screening     Status: None   Collection Time: 09/09/19  3:53 PM   Specimen: Nasal Mucosa; Nasopharyngeal  Result Value Ref Range   MRSA by PCR NEGATIVE  NEGATIVE    Comment:        The GeneXpert MRSA Assay (FDA approved for NASAL specimens only), is one component of a comprehensive MRSA colonization surveillance program. It is not intended to diagnose MRSA infection nor to guide or monitor treatment for MRSA infections. Performed at [facility], [address] [place], [place] [address]   CBC     Status: Abnormal   Collection Time: 09/10/19  3:25 AM  Result Value Ref Range   WBC 9.7 4.0 - 10.5 K/uL   RBC 4.07 (L) 4.22 - 5.81 MIL/uL   Hemoglobin 13.7 13.0 - 17.0 g/dL   HCT 09.8 11.9 - 14.7 %   MCV 99.8 80.0 - 100.0 fL   MCH 33.7 26.0 - 34.0 pg   MCHC 33.7 30.0 - 36.0 g/dL   RDW 82.9 56.2 - 13.0 %   Platelets 224 150 - 400 K/uL   nRBC 0.0 0.0 - 0.2 %    Comment: Performed at [facility], [address] [place], [place] [address]  Basic metabolic panel     Status: Abnormal   Collection Time: 09/10/19  3:25 AM  Result Value Ref Range   Sodium 133 (L) 135 - 145 mmol/L   Potassium 4.3 3.5 - 5.1 mmol/L   Chloride 100 98 - 111 mmol/L   CO2 25 22 - 32 mmol/L   Glucose, Bld 130 (H) 70 - 99 mg/dL   BUN 16 6 - 20 mg/dL   Creatinine, Ser 4.69 (H) 0.61 - 1.24 mg/dL   Calcium 8.8 (L) 8.9 - 10.3 mg/dL   GFR calc non Af Amer >60 >60 mL/min   GFR calc Af Amer >60 >60 mL/min   Anion gap 8 5 - 15    Comment: Performed at [facility], [address], [place], [place] [address]    CT ABDOMEN PELVIS WO CONTRAST  Result Date: 09/09/2019 CLINICAL DATA:  Larey Seat off ladder while cutting trees. Right-sided chest and abdominal pain. Right pneumothorax. EXAM: CT CHEST, ABDOMEN AND PELVIS WITHOUT CONTRAST TECHNIQUE: Multidetector CT imaging of the chest, abdomen and pelvis was performed following the standard protocol without IV contrast. COMPARISON:  None. FINDINGS: CT CHEST FINDINGS Cardiovascular: No acute findings. Mediastinum/Lymph Nodes: No evidence of mediastinal hematoma or pneumomediastinum. No masses or pathologically  enlarged lymph nodes identified on this unenhanced exam. Lungs/Pleura: A right-sided chest tube is seen in place, and a small approximately 15% residual right pneumothorax is seen. Extensive subcutaneous emphysema seen in the right chest wall. Airspace disease in the posterior right upper and lower lobes is suspicious for pulmonary contusion. Mild dependent atelectasis seen in the left lung base. Musculoskeletal: Multiple displaced fractures are seen involving the right lateral 2nd through 11th ribs. CT ABDOMEN AND PELVIS FINDINGS Hepatobiliary: No parenchymal injury visualized on this unenhanced exam. Gallbladder is unremarkable. No evidence of biliary ductal dilatation. Pancreas: Unremarkable. No parenchymal injury seen on this unenhanced exam. Spleen: Within normal limits in size. No  abnormality identified. Adrenals/Urinary Tract: No evidence of perinephric hematoma. No evidence of urolithiasis or hydronephrosis. Unremarkable appearance of bladder. Stomach/Bowel: No evidence of obstruction, inflammatory process, or abnormal fluid collections. No evidence of hemoperitoneum. Vascular/Lymphatic: No pathologically enlarged lymph nodes identified. No abdominal aortic aneurysm. Reproductive:  No masses or other significant abnormality. Other:  None. Musculoskeletal: No suspicious bone lesions identified. Bilateral L5 pars defects seen, with severe degenerative disc disease and grade 3 anterolisthesis measuring 20 mm at L5-S1. IMPRESSION: 1. Small residual right pneumothorax, with right chest tube in place. 2. Posterior right upper and lower lobe pulmonary contusion. 3. Multiple displaced fractures involving the right lateral 2nd through 11th ribs. 4. No evidence of abdominal organ injury or hemoperitoneum on this unenhanced exam. 5. Bilateral L5 pars defects, with grade 3 anterolisthesis and severe degenerative disc disease at L5-S1. Electronically Signed   By: Danae Orleans M.D.   On: 09/09/2019 13:41   CT Head Wo  Contrast  Result Date: 09/09/2019 CLINICAL DATA:  Fall from ladder EXAM: CT HEAD WITHOUT CONTRAST CT CERVICAL SPINE WITHOUT CONTRAST TECHNIQUE: Multidetector CT imaging of the head and cervical spine was performed following the standard protocol without intravenous contrast. Multiplanar CT image reconstructions of the cervical spine were also generated. COMPARISON:  07/05/2019 FINDINGS: CT HEAD FINDINGS Brain: No evidence of acute infarction, hemorrhage, hydrocephalus, extra-axial collection or mass lesion/mass effect. Vascular: No hyperdense vessel or unexpected calcification. Skull: Normal. Negative for fracture or focal lesion. Sinuses/Orbits: Chronic mucosal thickening within the bilateral maxillary sinuses and ethmoid air cells. Orbital structures intact. Other: Minimally displaced bilateral nasal bone fractures (series 4, image 71) with overlying soft tissue swelling. CT CERVICAL SPINE FINDINGS Alignment: Facet joints are normally aligned without traumatic listhesis. Dens and lateral masses aligned. Skull base and vertebrae: Cervical vertebral bodies are intact without acute fracture. Nondisplaced fracture of the posterior aspect of the right first rib at the costovertebral junction (series 15, image 73). Additional nondisplaced fracture of the posterior left first rib at the costovertebral junction (series 15, image 71; series 13, image 45). Soft tissues and spinal canal: No prevertebral fluid or swelling. No visible canal hematoma. Disc levels: Mild intervertebral disc height loss with degenerative endplate spurring most pronounced at C6-7. No advanced cervical facet arthropathy. Upper chest: Partially visualized right-sided chest tube positioned at the medial aspect of the right lung apex. Trace right apical pneumothorax (series 10, image 85). There is right chest wall subcutaneous emphysema extending into the base of the right neck. Other: None. IMPRESSION: 1. No acute intracranial process or skull  fracture identified. 2. Minimally displaced bilateral nasal bone fractures. 3. No acute fracture or traumatic listhesis of the cervical spine. 4. Nondisplaced fractures of the posterior aspects of the bilateral first ribs at the costovertebral junction. 5. Right-sided chest tube positioned at the medial aspect of the right lung apex. Trace residual right apical pneumothorax. 6. Right chest wall subcutaneous emphysema extending into the base of the right neck. Electronically Signed   By: Duanne Guess D.O.   On: 09/09/2019 13:41   CT Chest Wo Contrast  Result Date: 09/09/2019 CLINICAL DATA:  Larey Seat off ladder while cutting trees. Right-sided chest and abdominal pain. Right pneumothorax. EXAM: CT CHEST, ABDOMEN AND PELVIS WITHOUT CONTRAST TECHNIQUE: Multidetector CT imaging of the chest, abdomen and pelvis was performed following the standard protocol without IV contrast. COMPARISON:  None. FINDINGS: CT CHEST FINDINGS Cardiovascular: No acute findings. Mediastinum/Lymph Nodes: No evidence of mediastinal hematoma or pneumomediastinum. No masses or pathologically enlarged lymph nodes  identified on this unenhanced exam. Lungs/Pleura: A right-sided chest tube is seen in place, and a small approximately 15% residual right pneumothorax is seen. Extensive subcutaneous emphysema seen in the right chest wall. Airspace disease in the posterior right upper and lower lobes is suspicious for pulmonary contusion. Mild dependent atelectasis seen in the left lung base. Musculoskeletal: Multiple displaced fractures are seen involving the right lateral 2nd through 11th ribs. CT ABDOMEN AND PELVIS FINDINGS Hepatobiliary: No parenchymal injury visualized on this unenhanced exam. Gallbladder is unremarkable. No evidence of biliary ductal dilatation. Pancreas: Unremarkable. No parenchymal injury seen on this unenhanced exam. Spleen: Within normal limits in size. No abnormality identified. Adrenals/Urinary Tract: No evidence of  perinephric hematoma. No evidence of urolithiasis or hydronephrosis. Unremarkable appearance of bladder. Stomach/Bowel: No evidence of obstruction, inflammatory process, or abnormal fluid collections. No evidence of hemoperitoneum. Vascular/Lymphatic: No pathologically enlarged lymph nodes identified. No abdominal aortic aneurysm. Reproductive:  No masses or other significant abnormality. Other:  None. Musculoskeletal: No suspicious bone lesions identified. Bilateral L5 pars defects seen, with severe degenerative disc disease and grade 3 anterolisthesis measuring 20 mm at L5-S1. IMPRESSION: 1. Small residual right pneumothorax, with right chest tube in place. 2. Posterior right upper and lower lobe pulmonary contusion. 3. Multiple displaced fractures involving the right lateral 2nd through 11th ribs. 4. No evidence of abdominal organ injury or hemoperitoneum on this unenhanced exam. 5. Bilateral L5 pars defects, with grade 3 anterolisthesis and severe degenerative disc disease at L5-S1. Electronically Signed   By: Marlaine Hind M.D.   On: 09/09/2019 13:41   CT Cervical Spine Wo Contrast  Result Date: 09/09/2019 CLINICAL DATA:  Fall from ladder EXAM: CT HEAD WITHOUT CONTRAST CT CERVICAL SPINE WITHOUT CONTRAST TECHNIQUE: Multidetector CT imaging of the head and cervical spine was performed following the standard protocol without intravenous contrast. Multiplanar CT image reconstructions of the cervical spine were also generated. COMPARISON:  07/05/2019 FINDINGS: CT HEAD FINDINGS Brain: No evidence of acute infarction, hemorrhage, hydrocephalus, extra-axial collection or mass lesion/mass effect. Vascular: No hyperdense vessel or unexpected calcification. Skull: Normal. Negative for fracture or focal lesion. Sinuses/Orbits: Chronic mucosal thickening within the bilateral maxillary sinuses and ethmoid air cells. Orbital structures intact. Other: Minimally displaced bilateral nasal bone fractures (series 4, image 71)  with overlying soft tissue swelling. CT CERVICAL SPINE FINDINGS Alignment: Facet joints are normally aligned without traumatic listhesis. Dens and lateral masses aligned. Skull base and vertebrae: Cervical vertebral bodies are intact without acute fracture. Nondisplaced fracture of the posterior aspect of the right first rib at the costovertebral junction (series 15, image 73). Additional nondisplaced fracture of the posterior left first rib at the costovertebral junction (series 15, image 71; series 13, image 45). Soft tissues and spinal canal: No prevertebral fluid or swelling. No visible canal hematoma. Disc levels: Mild intervertebral disc height loss with degenerative endplate spurring most pronounced at C6-7. No advanced cervical facet arthropathy. Upper chest: Partially visualized right-sided chest tube positioned at the medial aspect of the right lung apex. Trace right apical pneumothorax (series 10, image 85). There is right chest wall subcutaneous emphysema extending into the base of the right neck. Other: None. IMPRESSION: 1. No acute intracranial process or skull fracture identified. 2. Minimally displaced bilateral nasal bone fractures. 3. No acute fracture or traumatic listhesis of the cervical spine. 4. Nondisplaced fractures of the posterior aspects of the bilateral first ribs at the costovertebral junction. 5. Right-sided chest tube positioned at the medial aspect of the right lung apex. Trace  residual right apical pneumothorax. 6. Right chest wall subcutaneous emphysema extending into the base of the right neck. Electronically Signed   By: Duanne Guess D.O.   On: 09/09/2019 13:41   DG Pelvis Portable  Result Date: 09/09/2019 CLINICAL DATA:  Pain after fall EXAM: PORTABLE PELVIS 1-2 VIEWS COMPARISON:  None. FINDINGS: Evaluation is limited due to unusual positioning. No left hip fractures identified on a single frontal view. Evaluation of the right hip is limited due to positioning. However,  within this limitation, no definite right hip fracture. The pubic rami are intact. The acetabula appear to be grossly intact. The iliac cleans are intact. The SI joints are symmetric. No obvious sacral fracture. The lower lumbar spine is unremarkable. IMPRESSION: The study is limited due to unusual positioning but no fracture is identified. Electronically Signed   By: Gerome Sam III M.D   On: 09/09/2019 12:55   DG Chest Port 1 View  Result Date: 09/10/2019 CLINICAL DATA:  Follow-up pneumothorax EXAM: PORTABLE CHEST 1 VIEW COMPARISON:  09/09/2019 FINDINGS: Right chest tube remains in place at the medial apex. No visible pleural air. Small amount of pleural or extrapleural fluid. Mild mid and lower lung atelectasis on the right. New mild atelectasis the left lung base. Right lateral rib fractures as seen previously. IMPRESSION: No visible pleural air on the right. Small accumulation of pleural fluid. Similar appearance of right mid and lower lung atelectasis. Slight worsening of left base atelectasis. Electronically Signed   By: Paulina Fusi M.D.   On: 09/10/2019 07:24   DG Chest Portable 1 View  Result Date: 09/09/2019 CLINICAL DATA:  Chest tube placement. EXAM: PORTABLE CHEST 1 VIEW COMPARISON:  September 09, 2019 FINDINGS: A chest tube has been placed in the interval. The pigtail projects over the trachea, likely in the medial most aspect of the right apex. The patient may benefit from withdrawing the tube 5 or 6 cm. The right-sided pneumothorax is much smaller in the interval. A probable tiny right pneumothorax remains. Continued opacity in the right mid lung is likely contusion. Air in the right chest wall remains. Rib fractures are again noted. No other interval changes. IMPRESSION: 1. The distal pigtail the chest tube projects over the trachea, likely in the most medial aspect of the lung, just anterior or posterior to the superior mediastinum. The patient may benefit from withdrawing 5 or 6 cm. 2.  The pneumothorax has almost completely resolved. Continued air in the right chest wall. Continued right rib fractures. Opacity in the right mid lung is likely contusion. Electronically Signed   By: Gerome Sam III M.D   On: 09/09/2019 13:17   DG Chest Portable 1 View  Result Date: 09/09/2019 CLINICAL DATA:  Pain after fall EXAM: PORTABLE CHEST 1 VIEW COMPARISON:  Dec 12, 2018 FINDINGS: At least 3 right-sided rib fractures are noted. There is a moderate right-sided pneumothorax measuring at least 3.5 cm at the apex and up to 3.3 cm laterally. Opacity in the right mid lung favored to represent contusion. No left-sided pneumothorax. The left lung is clear. The heart, hila, and mediastinum are unremarkable. IMPRESSION: 1. Moderate right-sided pneumothorax as above. At least 3 right-sided rib fractures are noted. Extensive chest wall soft tissue emphysema is identified. 2. Probable contusion in the right mid lung. Findings called to Dr. Lowella Bandy Electronically Signed   By: Gerome Sam III M.D   On: 09/09/2019 13:01   DG Shoulder Left Port  Result Date: 09/10/2019 CLINICAL DATA:  Fall from  ladder 09/09/2019.  Left shoulder pain. EXAM: LEFT SHOULDER COMPARISON:  Chest CT 09/09/2019 FINDINGS: Mild widening of the [facility] joint. Mild lucency over the superior aspect of the scapula/scapular spine compatible with known fracture in this location on recent CT. Acute lateral left eighth and ninth rib fractures. IMPRESSION: 1. Evidence of fracture along the superior left scapula/scapular spine. Displaced left lateral eighth and ninth rib fractures. 2.  Widened AC joint suggesting grade 2 separation. Electronically Signed   By: Elberta Fortis M.D.   On: 09/10/2019 10:13   DG Humerus Left  Result Date: 09/10/2019 CLINICAL DATA:  Lateral fall 09/09/2019 EXAM: LEFT HUMERUS - 2+ VIEW COMPARISON:  Left shoulder series same day. FINDINGS: No evidence of left humerus fracture. Other findings as described on patient's left  shoulder films unchanged. IMPRESSION: No left humeral fracture. Electronically Signed   By: Elberta Fortis M.D.   On: 09/10/2019 10:14    (ROS from intial ROM in ED) Review of Systems  Constitutional: Negative.   HENT: Negative.   Eyes: Negative.   Respiratory: Positive for shortness of breath.   Cardiovascular: Positive for chest pain.  Gastrointestinal: Negative.   Genitourinary: Negative.   Musculoskeletal: Positive for back pain and joint pain.  Skin: Negative.   Neurological: Negative.   Endo/Heme/Allergies: Negative.   Psychiatric/Behavioral: Negative.      Blood pressure 127/73, pulse 79, temperature 98.4 F (36.9 C), resp. rate 20, height 5\' 11"  (1.803 m), weight 74.8 kg, SpO2 99 %. (PE from initial H&P in ED) Physical Exam  Constitutional: He appears well-developed and well-nourished. No distress.  HENT:  Head: Normocephalic.  Right Ear: External ear normal.  Left Ear: External ear normal.  Mouth/Throat: Oropharynx is clear and moist.  Nasal abrasion  Eyes: Pupils are equal, round, and reactive to light. EOM are normal. No scleral icterus.  Neck: No tracheal deviation present.  Cardiovascular: Normal rate, regular rhythm and normal heart sounds.  Respiratory: Effort normal and breath sounds normal. No respiratory distress. He has no wheezes. He exhibits tenderness.  Crepitance and tenderness R ribs  GI: Soft. He exhibits no distension. There is no abdominal tenderness. There is no rebound and no guarding.  Musculoskeletal:        General: No deformity or edema.  Neurological: He is alert. He displays no atrophy and no tremor. He exhibits normal muscle tone. He displays no seizure activity. GCS eye subscore is 4. GCS verbal subscore is 5. GCS motor subscore is 6.  F/C to MAE  Skin: Skin is warm.  Psychiatric: He has a normal mood and affect.        Assessment/Plan: 1.  Left grade 2 AC joint separation 2.  Fracture along the superior left scapula/scapular  spine    Plan: Sling to the left arm to rest AC joint and scapular fracture Follow up in 2 weeks at [facility] Follow up with OLIN,Signora Zucco D in 2 weeks.  Contact information:  [facility] [address] [place] [address]       [phone] [facility] 09/10/2019, 12:49 PM

## 2019-09-11 ENCOUNTER — Inpatient Hospital Stay (HOSPITAL_COMMUNITY): Payer: Self-pay

## 2019-09-11 ENCOUNTER — Encounter (HOSPITAL_COMMUNITY): Payer: Self-pay | Admitting: General Surgery

## 2019-09-11 LAB — BASIC METABOLIC PANEL
Anion gap: 11 (ref 5–15)
BUN: 13 mg/dL (ref 6–20)
CO2: 25 mmol/L (ref 22–32)
Calcium: 8.9 mg/dL (ref 8.9–10.3)
Chloride: 99 mmol/L (ref 98–111)
Creatinine, Ser: 1.21 mg/dL (ref 0.61–1.24)
GFR calc Af Amer: >60 mL/min (ref >60)
GFR calc non Af Amer: >60 mL/min (ref >60)
Glucose, Bld: 110 mg/dL — ABNORMAL HIGH (ref 70–99)
Potassium: 4.3 mmol/L (ref 3.5–5.1)
Sodium: 135 mmol/L (ref 135–145)

## 2019-09-11 LAB — PHOSPHORUS: Phosphorus: 3.1 mg/dL (ref 2.5–4.6)

## 2019-09-11 LAB — CBC
HCT: 39.5 % (ref 39.0–52.0)
Hemoglobin: 13.2 g/dL (ref 13.0–17.0)
MCH: 33.2 pg (ref 26.0–34.0)
MCHC: 33.4 g/dL (ref 30.0–36.0)
MCV: 99.5 fL (ref 80.0–100.0)
Platelets: 188 10*3/uL (ref 150–400)
RBC: 3.97 MIL/uL — ABNORMAL LOW (ref 4.22–5.81)
RDW: 13.6 % (ref 11.5–15.5)
WBC: 7.8 10*3/uL (ref 4.0–10.5)
nRBC: 0 % (ref 0.0–0.2)

## 2019-09-11 LAB — MAGNESIUM: Magnesium: 2 mg/dL (ref 1.7–2.4)

## 2019-09-11 MED ORDER — DOCUSATE SODIUM 100 MG PO CAPS
100.0000 mg | ORAL_CAPSULE | Freq: Two times a day (BID) | ORAL | Status: DC
  Administered 2019-09-11 – 2019-09-14 (×7): 100 mg via ORAL
  Filled 2019-09-11 (×7): qty 1

## 2019-09-11 MED ORDER — ACETAMINOPHEN 500 MG PO TABS
1000.0000 mg | ORAL_TABLET | Freq: Four times a day (QID) | ORAL | Status: DC
  Administered 2019-09-11 – 2019-09-14 (×11): 1000 mg via ORAL
  Filled 2019-09-11 (×11): qty 2

## 2019-09-11 MED ORDER — CALCIUM CARBONATE ANTACID 500 MG PO CHEW
2.0000 | CHEWABLE_TABLET | Freq: Three times a day (TID) | ORAL | Status: DC | PRN
  Administered 2019-09-12 (×2): 400 mg via ORAL
  Filled 2019-09-11 (×3): qty 2

## 2019-09-11 MED ORDER — GUAIFENESIN 200 MG PO TABS
200.0000 mg | ORAL_TABLET | Freq: Four times a day (QID) | ORAL | Status: DC
  Filled 2019-09-11 (×3): qty 1

## 2019-09-11 MED ORDER — GUAIFENESIN ER 600 MG PO TB12
600.0000 mg | ORAL_TABLET | Freq: Two times a day (BID) | ORAL | Status: DC
  Administered 2019-09-11 – 2019-09-12 (×2): 600 mg via ORAL
  Filled 2019-09-11 (×5): qty 1

## 2019-09-11 MED ORDER — OXYCODONE HCL 5 MG PO TABS
10.0000 mg | ORAL_TABLET | ORAL | Status: DC | PRN
  Administered 2019-09-11 – 2019-09-14 (×11): 15 mg via ORAL
  Administered 2019-09-14: 10 mg via ORAL
  Filled 2019-09-11 (×8): qty 3
  Filled 2019-09-11: qty 2
  Filled 2019-09-11 (×3): qty 3

## 2019-09-11 MED ORDER — FLUTICASONE FUROATE-VILANTEROL 100-25 MCG/INH IN AEPB
1.0000 | INHALATION_SPRAY | Freq: Every day | RESPIRATORY_TRACT | Status: DC
  Administered 2019-09-12: 1 via RESPIRATORY_TRACT
  Filled 2019-09-11: qty 28

## 2019-09-11 MED ORDER — METHOCARBAMOL 500 MG PO TABS
1000.0000 mg | ORAL_TABLET | Freq: Three times a day (TID) | ORAL | Status: DC
  Administered 2019-09-11 – 2019-09-14 (×10): 1000 mg via ORAL
  Filled 2019-09-11 (×10): qty 2

## 2019-09-11 MED ORDER — POLYETHYLENE GLYCOL 3350 17 G PO PACK
17.0000 g | PACK | Freq: Every day | ORAL | Status: DC
  Administered 2019-09-11 – 2019-09-12 (×2): 17 g via ORAL
  Filled 2019-09-11 (×4): qty 1

## 2019-09-11 MED ORDER — HYDROMORPHONE HCL 1 MG/ML IJ SOLN
0.5000 mg | Freq: Four times a day (QID) | INTRAMUSCULAR | Status: DC | PRN
  Administered 2019-09-12: 0.5 mg via INTRAVENOUS
  Filled 2019-09-11: qty 1

## 2019-09-11 MED ORDER — IPRATROPIUM-ALBUTEROL 0.5-2.5 (3) MG/3ML IN SOLN
3.0000 mL | Freq: Four times a day (QID) | RESPIRATORY_TRACT | Status: DC
  Administered 2019-09-11 (×2): 3 mL via RESPIRATORY_TRACT
  Filled 2019-09-11 (×2): qty 3

## 2019-09-11 MED ORDER — ALBUTEROL SULFATE (2.5 MG/3ML) 0.083% IN NEBU: 2.5000 mg | INHALATION_SOLUTION | Freq: Four times a day (QID) | RESPIRATORY_TRACT | Status: DC

## 2019-09-11 MED ORDER — SIMETHICONE 80 MG PO CHEW
80.0000 mg | CHEWABLE_TABLET | Freq: Four times a day (QID) | ORAL | Status: DC | PRN
  Administered 2019-09-11 – 2019-09-12 (×3): 80 mg via ORAL
  Filled 2019-09-11 (×3): qty 1

## 2019-09-11 NOTE — Progress Notes (Addendum)
[facility] Progress Note     Subjective: CC-  Continues to have a lot of pain from rib fractures. This is limiting his mobility. Unable to take deep breaths.  CXR without pneumothorax, but he does have some atelectasis. On 4L [place]. Pulling 500 on IS.  Prior to admission he was living on someone's couch. Waiting to hear back from [employer].  Objective: Vital signs in last 24 hours: Temp:  [98.9 F (37.2 C)-99.8 F (37.7 C)] 98.9 F (37.2 C) (02/01 0829) Pulse Rate:  [81-85] 81 (02/01 0829) Resp:  [24] 24 (02/01 0829) BP: (127-139)/(82-97) 139/83 (02/01 0829) SpO2:  [91 %-97 %] 91 % (02/01 0829) Last BM Date: 09/08/19  Intake/Output from previous day: 01/31 0701 - 02/01 0700 In: 480 [P.O.:480] Out: 653 [Urine:625; Chest Tube:28] Intake/Output this shift: Total I/O In: 0  Out: 200 [Urine:200]  PE: Gen:  Alert, NAD but appears uncomfortable HEENT: EOM's intact, pupils equal and round Card:  RRR, 2+ DP pulses Pulm:  Wheezing bilaterally, no rhonchi, rate and effort normal on 4L . R CT in place Abd: Soft, NT/ND, +BS, no HSM Ext:  Calves soft and nontender without edema Psych: A&Ox3  Skin: no rashes noted, warm and dry  Lab Results:  Recent Labs    09/10/19 0325 09/11/19 0509  WBC 9.7 7.8  HGB 13.7 13.2  HCT 40.6 39.5  PLT 224 188   BMET Recent Labs    09/10/19 0325 09/11/19 0509  NA 133* 135  K 4.3 4.3  CL 100 99  CO2 25 25  GLUCOSE 130* 110*  BUN 16 13  CREATININE 1.27* 1.21  CALCIUM 8.8* 8.9   PT/INR Recent Labs    09/09/19 1217  LABPROT 13.3  INR 1.0   CMP     Component Value Date/Time   NA 135 09/11/2019 0509   K 4.3 09/11/2019 0509   CL 99 09/11/2019 0509   CO2 25 09/11/2019 0509   GLUCOSE 110 (H) 09/11/2019 0509   BUN 13 09/11/2019 0509   CREATININE 1.21 09/11/2019 0509   CALCIUM 8.9 09/11/2019 0509   PROT 7.0 09/09/2019 1217   ALBUMIN 3.6 09/09/2019 1217   AST 310 (H) 09/09/2019 1217   ALT 185 (H) 09/09/2019 1217    ALKPHOS 87 09/09/2019 1217   BILITOT 0.7 09/09/2019 1217   GFRNONAA >60 09/11/2019 0509   GFRAA >60 09/11/2019 0509   Lipase  No results found for: LIPASE     Studies/Results: CT ABDOMEN PELVIS WO CONTRAST  Result Date: 09/09/2019 CLINICAL DATA:  Larey SeatFell off ladder while cutting trees. Right-sided chest and abdominal pain. Right pneumothorax. EXAM: CT CHEST, ABDOMEN AND PELVIS WITHOUT CONTRAST TECHNIQUE: Multidetector CT imaging of the chest, abdomen and pelvis was performed following the standard protocol without IV contrast. COMPARISON:  None. FINDINGS: CT CHEST FINDINGS Cardiovascular: No acute findings. Mediastinum/Lymph Nodes: No evidence of mediastinal hematoma or pneumomediastinum. No masses or pathologically enlarged lymph nodes identified on this unenhanced exam. Lungs/Pleura: A right-sided chest tube is seen in place, and a small approximately 15% residual right pneumothorax is seen. Extensive subcutaneous emphysema seen in the right chest wall. Airspace disease in the posterior right upper and lower lobes is suspicious for pulmonary contusion. Mild dependent atelectasis seen in the left lung base. Musculoskeletal: Multiple displaced fractures are seen involving the right lateral 2nd through 11th ribs. CT ABDOMEN AND PELVIS FINDINGS Hepatobiliary: No parenchymal injury visualized on this unenhanced exam. Gallbladder is unremarkable. No evidence of biliary ductal dilatation. Pancreas: Unremarkable.  No parenchymal injury seen on this unenhanced exam. Spleen: Within normal limits in size. No abnormality identified. Adrenals/Urinary Tract: No evidence of perinephric hematoma. No evidence of urolithiasis or hydronephrosis. Unremarkable appearance of bladder. Stomach/Bowel: No evidence of obstruction, inflammatory process, or abnormal fluid collections. No evidence of hemoperitoneum. Vascular/Lymphatic: No pathologically enlarged lymph nodes identified. No abdominal aortic aneurysm. Reproductive:  No  masses or other significant abnormality. Other:  None. Musculoskeletal: No suspicious bone lesions identified. Bilateral L5 pars defects seen, with severe degenerative disc disease and grade 3 anterolisthesis measuring 20 mm at L5-S1. IMPRESSION: 1. Small residual right pneumothorax, with right chest tube in place. 2. Posterior right upper and lower lobe pulmonary contusion. 3. Multiple displaced fractures involving the right lateral 2nd through 11th ribs. 4. No evidence of abdominal organ injury or hemoperitoneum on this unenhanced exam. 5. Bilateral L5 pars defects, with grade 3 anterolisthesis and severe degenerative disc disease at L5-S1. Electronically Signed   By: Danae Orleans M.D.   On: 09/09/2019 13:41   CT Head Wo Contrast  Result Date: 09/09/2019 CLINICAL DATA:  Fall from ladder EXAM: CT HEAD WITHOUT CONTRAST CT CERVICAL SPINE WITHOUT CONTRAST TECHNIQUE: Multidetector CT imaging of the head and cervical spine was performed following the standard protocol without intravenous contrast. Multiplanar CT image reconstructions of the cervical spine were also generated. COMPARISON:  07/05/2019 FINDINGS: CT HEAD FINDINGS Brain: No evidence of acute infarction, hemorrhage, hydrocephalus, extra-axial collection or mass lesion/mass effect. Vascular: No hyperdense vessel or unexpected calcification. Skull: Normal. Negative for fracture or focal lesion. Sinuses/Orbits: Chronic mucosal thickening within the bilateral maxillary sinuses and ethmoid air cells. Orbital structures intact. Other: Minimally displaced bilateral nasal bone fractures (series 4, image 71) with overlying soft tissue swelling. CT CERVICAL SPINE FINDINGS Alignment: Facet joints are normally aligned without traumatic listhesis. Dens and lateral masses aligned. Skull base and vertebrae: Cervical vertebral bodies are intact without acute fracture. Nondisplaced fracture of the posterior aspect of the right first rib at the costovertebral junction  (series 15, image 73). Additional nondisplaced fracture of the posterior left first rib at the costovertebral junction (series 15, image 71; series 13, image 45). Soft tissues and spinal canal: No prevertebral fluid or swelling. No visible canal hematoma. Disc levels: Mild intervertebral disc height loss with degenerative endplate spurring most pronounced at C6-7. No advanced cervical facet arthropathy. Upper chest: Partially visualized right-sided chest tube positioned at the medial aspect of the right lung apex. Trace right apical pneumothorax (series 10, image 85). There is right chest wall subcutaneous emphysema extending into the base of the right neck. Other: None. IMPRESSION: 1. No acute intracranial process or skull fracture identified. 2. Minimally displaced bilateral nasal bone fractures. 3. No acute fracture or traumatic listhesis of the cervical spine. 4. Nondisplaced fractures of the posterior aspects of the bilateral first ribs at the costovertebral junction. 5. Right-sided chest tube positioned at the medial aspect of the right lung apex. Trace residual right apical pneumothorax. 6. Right chest wall subcutaneous emphysema extending into the base of the right neck. Electronically Signed   By: Duanne Guess D.O.   On: 09/09/2019 13:41   CT Chest Wo Contrast  Result Date: 09/09/2019 CLINICAL DATA:  Larey Seat off ladder while cutting trees. Right-sided chest and abdominal pain. Right pneumothorax. EXAM: CT CHEST, ABDOMEN AND PELVIS WITHOUT CONTRAST TECHNIQUE: Multidetector CT imaging of the chest, abdomen and pelvis was performed following the standard protocol without IV contrast. COMPARISON:  None. FINDINGS: CT CHEST FINDINGS Cardiovascular: No acute findings. Mediastinum/Lymph  Nodes: No evidence of mediastinal hematoma or pneumomediastinum. No masses or pathologically enlarged lymph nodes identified on this unenhanced exam. Lungs/Pleura: A right-sided chest tube is seen in place, and a small  approximately 15% residual right pneumothorax is seen. Extensive subcutaneous emphysema seen in the right chest wall. Airspace disease in the posterior right upper and lower lobes is suspicious for pulmonary contusion. Mild dependent atelectasis seen in the left lung base. Musculoskeletal: Multiple displaced fractures are seen involving the right lateral 2nd through 11th ribs. CT ABDOMEN AND PELVIS FINDINGS Hepatobiliary: No parenchymal injury visualized on this unenhanced exam. Gallbladder is unremarkable. No evidence of biliary ductal dilatation. Pancreas: Unremarkable. No parenchymal injury seen on this unenhanced exam. Spleen: Within normal limits in size. No abnormality identified. Adrenals/Urinary Tract: No evidence of perinephric hematoma. No evidence of urolithiasis or hydronephrosis. Unremarkable appearance of bladder. Stomach/Bowel: No evidence of obstruction, inflammatory process, or abnormal fluid collections. No evidence of hemoperitoneum. Vascular/Lymphatic: No pathologically enlarged lymph nodes identified. No abdominal aortic aneurysm. Reproductive:  No masses or other significant abnormality. Other:  None. Musculoskeletal: No suspicious bone lesions identified. Bilateral L5 pars defects seen, with severe degenerative disc disease and grade 3 anterolisthesis measuring 20 mm at L5-S1. IMPRESSION: 1. Small residual right pneumothorax, with right chest tube in place. 2. Posterior right upper and lower lobe pulmonary contusion. 3. Multiple displaced fractures involving the right lateral 2nd through 11th ribs. 4. No evidence of abdominal organ injury or hemoperitoneum on this unenhanced exam. 5. Bilateral L5 pars defects, with grade 3 anterolisthesis and severe degenerative disc disease at L5-S1. Electronically Signed   By: Danae Orleans M.D.   On: 09/09/2019 13:41   CT Cervical Spine Wo Contrast  Result Date: 09/09/2019 CLINICAL DATA:  Fall from ladder EXAM: CT HEAD WITHOUT CONTRAST CT CERVICAL SPINE  WITHOUT CONTRAST TECHNIQUE: Multidetector CT imaging of the head and cervical spine was performed following the standard protocol without intravenous contrast. Multiplanar CT image reconstructions of the cervical spine were also generated. COMPARISON:  07/05/2019 FINDINGS: CT HEAD FINDINGS Brain: No evidence of acute infarction, hemorrhage, hydrocephalus, extra-axial collection or mass lesion/mass effect. Vascular: No hyperdense vessel or unexpected calcification. Skull: Normal. Negative for fracture or focal lesion. Sinuses/Orbits: Chronic mucosal thickening within the bilateral maxillary sinuses and ethmoid air cells. Orbital structures intact. Other: Minimally displaced bilateral nasal bone fractures (series 4, image 71) with overlying soft tissue swelling. CT CERVICAL SPINE FINDINGS Alignment: Facet joints are normally aligned without traumatic listhesis. Dens and lateral masses aligned. Skull base and vertebrae: Cervical vertebral bodies are intact without acute fracture. Nondisplaced fracture of the posterior aspect of the right first rib at the costovertebral junction (series 15, image 73). Additional nondisplaced fracture of the posterior left first rib at the costovertebral junction (series 15, image 71; series 13, image 45). Soft tissues and spinal canal: No prevertebral fluid or swelling. No visible canal hematoma. Disc levels: Mild intervertebral disc height loss with degenerative endplate spurring most pronounced at C6-7. No advanced cervical facet arthropathy. Upper chest: Partially visualized right-sided chest tube positioned at the medial aspect of the right lung apex. Trace right apical pneumothorax (series 10, image 85). There is right chest wall subcutaneous emphysema extending into the base of the right neck. Other: None. IMPRESSION: 1. No acute intracranial process or skull fracture identified. 2. Minimally displaced bilateral nasal bone fractures. 3. No acute fracture or traumatic listhesis of  the cervical spine. 4. Nondisplaced fractures of the posterior aspects of the bilateral first ribs at the costovertebral  junction. 5. Right-sided chest tube positioned at the medial aspect of the right lung apex. Trace residual right apical pneumothorax. 6. Right chest wall subcutaneous emphysema extending into the base of the right neck. Electronically Signed   By: Duanne Guess D.O.   On: 09/09/2019 13:41   DG Pelvis Portable  Result Date: 09/09/2019 CLINICAL DATA:  Pain after fall EXAM: PORTABLE PELVIS 1-2 VIEWS COMPARISON:  None. FINDINGS: Evaluation is limited due to unusual positioning. No left hip fractures identified on a single frontal view. Evaluation of the right hip is limited due to positioning. However, within this limitation, no definite right hip fracture. The pubic rami are intact. The acetabula appear to be grossly intact. The iliac cleans are intact. The SI joints are symmetric. No obvious sacral fracture. The lower lumbar spine is unremarkable. IMPRESSION: The study is limited due to unusual positioning but no fracture is identified. Electronically Signed   By: Gerome Sam III M.D   On: 09/09/2019 12:55   DG CHEST PORT 1 VIEW  Result Date: 09/11/2019 CLINICAL DATA:  Follow-up right-sided pneumothorax. EXAM: PORTABLE CHEST 1 VIEW COMPARISON:  Chest x-ray 09/09/2019 FINDINGS: The right-sided chest tube is stable. No residual right-sided pneumothorax is identified. Slight improved subcutaneous emphysema. Slight increase and bibasilar atelectasis. Stable right rib fractures. IMPRESSION: 1. Stable right-sided chest tube without residual pneumothorax. 2. Slight increase in bibasilar atelectasis, right greater than left. Electronically Signed   By: Rudie Meyer M.D.   On: 09/11/2019 07:33   DG Chest Port 1 View  Result Date: 09/10/2019 CLINICAL DATA:  Follow-up pneumothorax EXAM: PORTABLE CHEST 1 VIEW COMPARISON:  09/09/2019 FINDINGS: Right chest tube remains in place at the medial  apex. No visible pleural air. Small amount of pleural or extrapleural fluid. Mild mid and lower lung atelectasis on the right. New mild atelectasis the left lung base. Right lateral rib fractures as seen previously. IMPRESSION: No visible pleural air on the right. Small accumulation of pleural fluid. Similar appearance of right mid and lower lung atelectasis. Slight worsening of left base atelectasis. Electronically Signed   By: Paulina Fusi M.D.   On: 09/10/2019 07:24   DG Chest Portable 1 View  Result Date: 09/09/2019 CLINICAL DATA:  Chest tube placement. EXAM: PORTABLE CHEST 1 VIEW COMPARISON:  September 09, 2019 FINDINGS: A chest tube has been placed in the interval. The pigtail projects over the trachea, likely in the medial most aspect of the right apex. The patient may benefit from withdrawing the tube 5 or 6 cm. The right-sided pneumothorax is much smaller in the interval. A probable tiny right pneumothorax remains. Continued opacity in the right mid lung is likely contusion. Air in the right chest wall remains. Rib fractures are again noted. No other interval changes. IMPRESSION: 1. The distal pigtail the chest tube projects over the trachea, likely in the most medial aspect of the lung, just anterior or posterior to the superior mediastinum. The patient may benefit from withdrawing 5 or 6 cm. 2. The pneumothorax has almost completely resolved. Continued air in the right chest wall. Continued right rib fractures. Opacity in the right mid lung is likely contusion. Electronically Signed   By: Gerome Sam III M.D   On: 09/09/2019 13:17   DG Chest Portable 1 View  Result Date: 09/09/2019 CLINICAL DATA:  Pain after fall EXAM: PORTABLE CHEST 1 VIEW COMPARISON:  Dec 12, 2018 FINDINGS: At least 3 right-sided rib fractures are noted. There is a moderate right-sided pneumothorax measuring at least 3.5  cm at the apex and up to 3.3 cm laterally. Opacity in the right mid lung favored to represent contusion. No  left-sided pneumothorax. The left lung is clear. The heart, hila, and mediastinum are unremarkable. IMPRESSION: 1. Moderate right-sided pneumothorax as above. At least 3 right-sided rib fractures are noted. Extensive chest wall soft tissue emphysema is identified. 2. Probable contusion in the right mid lung. Findings called to Dr. Dorothyann Gibbs Electronically Signed   By: Dorise Bullion III M.D   On: 09/09/2019 13:01   DG Shoulder Left Port  Result Date: 09/10/2019 CLINICAL DATA:  Fall from ladder 09/09/2019.  Left shoulder pain. EXAM: LEFT SHOULDER COMPARISON:  Chest CT 09/09/2019 FINDINGS: Mild widening of the [facility] joint. Mild lucency over the superior aspect of the scapula/scapular spine compatible with known fracture in this location on recent CT. Acute lateral left eighth and ninth rib fractures. IMPRESSION: 1. Evidence of fracture along the superior left scapula/scapular spine. Displaced left lateral eighth and ninth rib fractures. 2.  Widened AC joint suggesting grade 2 separation. Electronically Signed   By: Marin Olp M.D.   On: 09/10/2019 10:13   DG Humerus Left  Result Date: 09/10/2019 CLINICAL DATA:  Lateral fall 09/09/2019 EXAM: LEFT HUMERUS - 2+ VIEW COMPARISON:  Left shoulder series same day. FINDINGS: No evidence of left humerus fracture. Other findings as described on patient's left shoulder films unchanged. IMPRESSION: No left humeral fracture. Electronically Signed   By: Marin Olp M.D.   On: 09/10/2019 10:14    Anti-infectives: Anti-infectives (From admission, onward)   None       Assessment/Plan Fall from ladder R Rib FX4-10with PTX - CXR without PNX, place chest tube to water seal and repeat CXR in AM. Continue pulm toilet/IS and pain control L grade 2 AC joint separation, L scapula fx - per Dr. Alvan Dame, nonop, sling LUE, follow up 2 weeks Nasal abrasion CKD - CRT improved 1.21 Asthma GERD  ID - noe FEN - reg diet, add colace/miralax VTE - SCDs, lovenox Foley -  none Follow up - trauma, ortho  Plan - Increase oxy scale 10-15mg , add scheduled robaxin. Schedule duonebs and robitussin today. Continue therapies, currently recommending CIR.    LOS: 2 days    Wellington Hampshire, [facility] 09/11/2019, 9:59 AM Please see Amion for pager number during day hours 7:00am-4:30pm

## 2019-09-11 NOTE — Progress Notes (Signed)
Inpatient Rehabilitation Admissions Coordinator  Feel that once chest tube removed and pain more controlled, that patient can d/c home . Will not need a CIR admit at this time.  Ottie Glazier, RN, MSN Rehab Admissions Coordinator [phone] 09/11/2019 7:34 PM

## 2019-09-11 NOTE — Evaluation (Signed)
Occupational Therapy Evaluation Patient Details Name: Keith Weber MRN: [id number] DOB: 12/20/1970 Today's Date: 09/11/2019    History of Present Illness Pt is a 49 y.o. male who presents to the ED via EMS after falling from a ladder while trimming trees. He sustained R pneumothorax, R rib fxs, L scapular fx, and L AC joint separation.   Clinical Impression   Pt PTA: living alone and independent with ADL and  Mobility. Pt was working during incident. Pt currently limited severely by pain, decreased ability to use BUEs due to pain, decreased strength and decreased activity tolerance. Pt currently performing sit to stands with modA +2 hand held assist. Pt minA to maxA for ADL due to pain and discomfort with twisting and turning. Pt refusing to wear LUE sling due to discomfort in it. Limited BUE assessment as pt worrieid about asthma. O2 sats >90% on 3-4L O2 [place]. Pt with flexed forward posture and rigidity in BLEs at knees flexed with mobility. Pt would greatly benefit from continued OT skilled services. OT following acutely.    Follow Up Recommendations  CIR    Equipment Recommendations  3 in 1 bedside commode;Other (comment)(to be determined at next venue)    Recommendations for Other Services       Precautions / Restrictions Precautions Precautions: Fall;Other (comment) Precaution Comments: chest tube R Required Braces or Orthoses: Sling Restrictions Weight Bearing Restrictions: No Other Position/Activity Restrictions: LUE WB not specified. Maintained NWB in sling during eval.      Mobility Bed Mobility Overal bed mobility: Needs Assistance             General bed mobility comments: sitting EOB bent over; unable to sit upright due to pain  Transfers Overall transfer level: Needs assistance Equipment used: 2 person hand held assist Transfers: Sit to/from Stand Sit to Stand: Min assist;Mod assist;+2 physical assistance;From elevated surface         General transfer  comment: pushing with RUE and lightly with LUE supported by OTR; pt refusing to wear shoulder immobilizer    Balance Overall balance assessment: Needs assistance   Sitting balance-Leahy Scale: Fair       Standing balance-Leahy Scale: Poor Standing balance comment: x2 hand held assist with knees bent and forward flexed posture                           ADL either performed or assessed with clinical judgement   ADL Overall ADL's : Needs assistance/impaired Eating/Feeding: Modified independent;Sitting   Grooming: Minimal assistance;Sitting   Upper Body Bathing: Minimal assistance;Sitting   Lower Body Bathing: Moderate assistance;Maximal assistance;+2 for physical assistance;Cueing for safety;Cueing for sequencing;Sitting/lateral leans;Sit to/from stand   Upper Body Dressing : Minimal assistance;Sitting;Cueing for safety   Lower Body Dressing: Moderate assistance;Maximal assistance;+2 for physical assistance;Cueing for safety;Sitting/lateral leans;Sit to/from stand   Toilet Transfer: Minimal assistance;+2 for physical assistance;+2 for safety/equipment;Ambulation;Cueing for safety   Toileting- Clothing Manipulation and Hygiene: Maximal assistance;+2 for physical assistance;+2 for safety/equipment;Cueing for safety;Sitting/lateral lean;Sit to/from stand       Functional mobility during ADLs: Minimal assistance;Moderate assistance;+2 for physical assistance;+2 for safety/equipment;Cueing for safety General ADL Comments: Pt limited severely by pain, decreased ability to use BUEs due to pain, decreased strength and decreased activity tolerance.     Vision Baseline Vision/History: No visual deficits Patient Visual Report: No change from baseline Vision Assessment?: No apparent visual deficits     Perception     Praxis  Pertinent Vitals/Pain Pain Assessment: No/denies pain Faces Pain Scale: Hurts even more Pain Location: R flank, LUE Pain Descriptors /  Indicators: Grimacing;Guarding;Sharp;Discomfort Pain Intervention(s): Monitored during session     Hand Dominance Right   Extremity/Trunk Assessment Upper Extremity Assessment Upper Extremity Assessment: Generalized weakness;RUE deficits/detail;LUE deficits/detail RUE Deficits / Details: R rib fx and pneumothorax on R side; sore with shoulder movements LUE Deficits / Details: L scapular fx, L AC joint separation; sling for comfort LUE Coordination: decreased fine motor;decreased gross motor   Lower Extremity Assessment Lower Extremity Assessment: Generalized weakness;Defer to PT evaluation   Cervical / Trunk Assessment Cervical / Trunk Assessment: Kyphotic   Communication Communication Communication: No difficulties   Cognition Arousal/Alertness: Awake/alert Behavior During Therapy: WFL for tasks assessed/performed Overall Cognitive Status: Within Functional Limits for tasks assessed                                     General Comments  Pt on 3.5 L O2 [place] and requiring 3-4L O2 with ambulation in room and hallway ~100' with sats >91% O2.    Exercises     Shoulder Instructions      Home Living Family/patient expects to be discharged to:: Private residence Living Arrangements: Other relatives Available Help at Discharge: Family;Available PRN/intermittently Type of Home: House Home Access: Ramped entrance     Home Layout: One level     Bathroom Shower/Tub: Producer, television/film/video: Standard     Home Equipment: None          Prior Functioning/Environment Level of Independence: Independent        Comments: working, driving        OT Problem List: Decreased strength;Decreased activity tolerance;Impaired balance (sitting and/or standing);Decreased safety awareness;Pain;Impaired UE functional use      OT Treatment/Interventions: Self-care/ADL training;Therapeutic exercise;Neuromuscular education;Energy conservation;Therapeutic  activities;Patient/family education;Balance training    OT Goals(Current goals can be found in the care plan section) Acute Rehab OT Goals Patient Stated Goal: decrease pain OT Goal Formulation: With patient Time For Goal Achievement: 09/25/19 Potential to Achieve Goals: Good ADL Goals Pt Will Perform Grooming: with supervision;standing Pt Will Perform Lower Body Dressing: with min guard assist;sit to/from stand;sitting/lateral leans;with adaptive equipment Pt Will Transfer to Toilet: with min guard assist;ambulating;bedside commode Pt Will Perform Toileting - Clothing Manipulation and hygiene: with min guard assist;sitting/lateral leans;sit to/from stand Pt/caregiver will Perform Home Exercise Program: Increased strength;Increased ROM;Both right and left upper extremity  OT Frequency: Min 2X/week   Barriers to D/C:            Co-evaluation PT/OT/SLP Co-Evaluation/Treatment: Yes Reason for Co-Treatment: Complexity of the patient's impairments (multi-system involvement);For patient/therapist safety;To address functional/ADL transfers          AM-PAC OT "6 Clicks" Daily Activity     Outcome Measure Help from another person eating meals?: A Little Help from another person taking care of personal grooming?: A Little Help from another person toileting, which includes using toliet, bedpan, or urinal?: A Lot Help from another person bathing (including washing, rinsing, drying)?: A Lot Help from another person to put on and taking off regular upper body clothing?: A Lot Help from another person to put on and taking off regular lower body clothing?: Total 6 Click Score: 13   End of Session Equipment Utilized During Treatment: Oxygen Nurse Communication: Mobility status  Activity Tolerance: Patient limited by pain Patient left: in bed;with call  bell/phone within reach  OT Visit Diagnosis: Muscle weakness (generalized) (M62.81);Pain;Unsteadiness on feet (R26.81) Pain - Right/Left:  Right Pain - part of body: (side)                Time: 1340-1405 OT Time Calculation (min): 25 min Charges:  OT General Charges $OT Visit: 1 Visit OT Evaluation $OT Eval Moderate Complexity: 1 Mod  Flora Lipps OTR/L Acute Rehabilitation Services Pager: [phone] Office: [phone]   Keturah Yerby C 09/11/2019, 4:37 PM

## 2019-09-11 NOTE — Progress Notes (Signed)
Spent a little time with Mr. Taaffe. Noticed that chest tube was still to suction but ordered to be to water seal. His nurse and I removed the suction from the suction port and placed chest tube to water seal/to gravity.  Emphasized that IS use is more about holding deep breath in for 5-10 seconds and exhale through pursed lips vs volume of air.   Educated patient on how to splint when coughing. Has been able to cough up some mucus.  He asked about taking something to loosen his mucus, I recommended drinking plenty of water and staying hydrated - he was agreeable to this.

## 2019-09-11 NOTE — Progress Notes (Signed)
Physical Therapy Treatment Patient Details Name: Keith Weber MRN: [id number] DOB: 1971-01-05 Today's Date: 09/11/2019    History of Present Illness Pt is a 49 y.o. male who presents to the ED via EMS after falling from a ladder while trimming trees. He sustained R pneumothorax, R rib fxs, L scapular fx, and L AC joint separation.    PT Comments    Pt was slow to agree with mobilizing, but agree after encouragement.  Pt reported to be sore all over and initially stood and ambulated in a significantly flexed and sagging posture/stance, which improved with distance, postural cues and time up.     Follow Up Recommendations  CIR     Equipment Recommendations  Other (comment)    Recommendations for Other Services       Precautions / Restrictions Precautions Precautions: Fall;Other (comment) Precaution Comments: chest tube R Required Braces or Orthoses: (pt refused to wear sling) Restrictions Weight Bearing Restrictions: No Other Position/Activity Restrictions: LUE WB not specified. Maintained NWB during eval.    Mobility  Bed Mobility Overal bed mobility: Needs Assistance             General bed mobility comments: sitting EOB bent over; unable to sit upright due to pain  Transfers Overall transfer level: Needs assistance Equipment used: 2 person hand held assist Transfers: Sit to/from Stand Sit to Stand: Min assist;Mod assist;+2 physical assistance;From elevated surface         General transfer comment: pushing with RUE and lightly with LUE supported by OTR; pt refusing to wear shoulder immobilizer  Ambulation/Gait Ambulation/Gait assistance: Min assist;+2 physical assistance;+2 safety/equipment Gait Distance (Feet): 180 Feet Assistive device: 1 person hand held assist Gait Pattern/deviations: Step-through pattern;Decreased step length - right;Decreased step length - left;Decreased stride length Gait velocity: slower Gait velocity interpretation: <1.8 ft/sec,  indicate of risk for recurrent falls General Gait Details: short, guarded, narrow-based steps with flexed posture that improved noticeably with time up.  Pt needed assist through at least 1 extremity for distance.   Stairs             Wheelchair Mobility    Modified Rankin (Stroke Patients Only)       Balance Overall balance assessment: Needs assistance   Sitting balance-Leahy Scale: Fair       Standing balance-Leahy Scale: Poor(and fair statically after walking some distance.) Standing balance comment: x2 hand held assist with knees bent and forward flexed posture                            Cognition Arousal/Alertness: Awake/alert Behavior During Therapy: WFL for tasks assessed/performed Overall Cognitive Status: Within Functional Limits for tasks assessed                                        Exercises      General Comments General comments (skin integrity, edema, etc.): Pt on 3.5 L O2 [place] and requiring 3-4L O2 with ambulation in room and hallway ~100' with sats >91% O2.      Pertinent Vitals/Pain Pain Assessment: Faces Faces Pain Scale: Hurts even more Pain Location: R flank, LUE Pain Descriptors / Indicators: Grimacing;Guarding;Sharp;Discomfort Pain Intervention(s): Monitored during session    Home Living Family/patient expects to be discharged to:: Private residence Living Arrangements: Other relatives Available Help at Discharge: Family;Available PRN/intermittently Type of Home: House Home Access: Ramped entrance  Home Layout: One level Home Equipment: None      Prior Function Level of Independence: Independent      Comments: working, driving   PT Goals (current goals can now be found in the care plan section) Acute Rehab PT Goals Patient Stated Goal: decrease pain PT Goal Formulation: With patient Time For Goal Achievement: 09/24/19 Potential to Achieve Goals: Good Progress towards PT goals: Progressing toward  goals    Frequency    Min 4X/week      PT Plan Current plan remains appropriate;Frequency needs to be updated    Co-evaluation PT/OT/SLP Co-Evaluation/Treatment: Yes Reason for Co-Treatment: Complexity of the patient's impairments (multi-system involvement) PT goals addressed during session: Mobility/safety with mobility OT goals addressed during session: ADL's and self-care      AM-PAC PT "6 Clicks" Mobility   Outcome Measure  Help needed turning from your back to your side while in a flat bed without using bedrails?: A Lot Help needed moving from lying on your back to sitting on the side of a flat bed without using bedrails?: A Lot Help needed moving to and from a bed to a chair (including a wheelchair)?: A Lot Help needed standing up from a chair using your arms (e.g., wheelchair or bedside chair)?: A Lot Help needed to walk in hospital room?: Total Help needed climbing 3-5 steps with a railing? : Total 6 Click Score: 10    End of Session Equipment Utilized During Treatment: Oxygen Activity Tolerance: Patient limited by pain Patient left: in bed;with call bell/phone within reach;with bed alarm set;Other (comment)(sitting EOB) Nurse Communication: Mobility status PT Visit Diagnosis: Other abnormalities of gait and mobility (R26.89);Pain Pain - Right/Left: Left Pain - part of body: Arm;Shoulder     Time: 3329-5188 PT Time Calculation (min) (ACUTE ONLY): 36 min  Charges:  $Gait Training: 8-22 mins                     09/11/2019  Jacinto Halim., PT Acute Rehabilitation Services [phone]  (pager) [phone]  (office)   Eliseo Gum Libbi Towner 09/11/2019, 5:02 PM

## 2019-09-12 ENCOUNTER — Inpatient Hospital Stay (HOSPITAL_COMMUNITY): Payer: Self-pay

## 2019-09-12 LAB — CBC
HCT: 36.8 % — ABNORMAL LOW (ref 39.0–52.0)
Hemoglobin: 12.6 g/dL — ABNORMAL LOW (ref 13.0–17.0)
MCH: 34.1 pg — ABNORMAL HIGH (ref 26.0–34.0)
MCHC: 34.2 g/dL (ref 30.0–36.0)
MCV: 99.7 fL (ref 80.0–100.0)
Platelets: 194 10*3/uL (ref 150–400)
RBC: 3.69 MIL/uL — ABNORMAL LOW (ref 4.22–5.81)
RDW: 13.5 % (ref 11.5–15.5)
WBC: 6.7 10*3/uL (ref 4.0–10.5)
nRBC: 0 % (ref 0.0–0.2)

## 2019-09-12 LAB — PHOSPHORUS: Phosphorus: 3 mg/dL (ref 2.5–4.6)

## 2019-09-12 LAB — BASIC METABOLIC PANEL
Anion gap: 8 (ref 5–15)
BUN: 10 mg/dL (ref 6–20)
CO2: 26 mmol/L (ref 22–32)
Calcium: 8.4 mg/dL — ABNORMAL LOW (ref 8.9–10.3)
Chloride: 100 mmol/L (ref 98–111)
Creatinine, Ser: 1.25 mg/dL — ABNORMAL HIGH (ref 0.61–1.24)
GFR calc Af Amer: >60 mL/min (ref >60)
GFR calc non Af Amer: >60 mL/min (ref >60)
Glucose, Bld: 140 mg/dL — ABNORMAL HIGH (ref 70–99)
Potassium: 3.9 mmol/L (ref 3.5–5.1)
Sodium: 134 mmol/L — ABNORMAL LOW (ref 135–145)

## 2019-09-12 LAB — MAGNESIUM: Magnesium: 2.1 mg/dL (ref 1.7–2.4)

## 2019-09-12 MED ORDER — IPRATROPIUM-ALBUTEROL 0.5-2.5 (3) MG/3ML IN SOLN
3.0000 mL | Freq: Three times a day (TID) | RESPIRATORY_TRACT | Status: DC
  Administered 2019-09-12 (×3): 3 mL via RESPIRATORY_TRACT
  Filled 2019-09-12 (×4): qty 3

## 2019-09-12 MED ORDER — ALUM & MAG HYDROXIDE-SIMETH 200-200-20 MG/5ML PO SUSP
30.0000 mL | ORAL | Status: DC | PRN
  Administered 2019-09-12: 10:00:00 30 mL via ORAL
  Filled 2019-09-12: qty 30

## 2019-09-12 MED ORDER — IPRATROPIUM-ALBUTEROL 0.5-2.5 (3) MG/3ML IN SOLN: 3.0000 mL | Freq: Two times a day (BID) | RESPIRATORY_TRACT | Status: DC

## 2019-09-12 NOTE — Progress Notes (Signed)
Physical Therapy Treatment Patient Details Name: Keith Weber MRN: [id number] DOB: 1971/07/15 Today's Date: 09/12/2019    History of Present Illness Pt is a 49 y.o. male who presents to the ED via EMS after falling from a ladder while trimming trees. He sustained R pneumothorax, R rib fxs, L scapular fx, and L AC joint separation.    PT Comments    Pt needing encouragement to work through the pain to progress.  Pt not wanting to move.  Emphasis on transition into bed, transfers, gait stability/quality and speed.    Follow Up Recommendations  No PT follow up;Home health PT;Other (comment)(dependent on progress, denied by CIR)     Equipment Recommendations  Other (comment)(TBD)    Recommendations for Other Services       Precautions / Restrictions Precautions Precautions: Fall Precaution Comments: chest tube R Required Braces or Orthoses: Other Brace(pt refuses to wear his sling) Restrictions Other Position/Activity Restrictions: LUE WB not specified. Maintained NWB during eval.    Mobility  Bed Mobility Overal bed mobility: Needs Assistance Bed Mobility: Sit to Sidelying         Sit to sidelying: Mod assist(HOB flattened) General bed mobility comments: pt balked at flattening the bed, but when encourage to try as he would be a home he needed assist with LE's  Transfers Overall transfer level: Needs assistance   Transfers: Sit to/from Stand Sit to Stand: Min assist         General transfer comment: cues and minimal stability more than boost assist.  pt very slow to rise with flexed posture for minutes.  Ambulation/Gait Ambulation/Gait assistance: Min assist Gait Distance (Feet): 240 Feet Assistive device: 1 person hand held assist Gait Pattern/deviations: Step-through pattern;Decreased step length - right;Decreased step length - left;Decreased stride length Gait velocity: slower Gait velocity interpretation: <1.8 ft/sec, indicate of risk for recurrent  falls General Gait Details: Again, short, guarded, narrow-based steps with flexed posture that improved noticeably with time up.  Pt needed assist through at least 1 extremity for distance.  Speed improved notably with distance, but was still slow.   Stairs             Wheelchair Mobility    Modified Rankin (Stroke Patients Only)       Balance Overall balance assessment: Needs assistance   Sitting balance-Leahy Scale: Fair       Standing balance-Leahy Scale: Fair                              Cognition Arousal/Alertness: Awake/alert Behavior During Therapy: WFL for tasks assessed/performed Overall Cognitive Status: Within Functional Limits for tasks assessed                                        Exercises      General Comments General comments (skin integrity, edema, etc.): sats maintained at 90/91% on RA during gait.      Pertinent Vitals/Pain Pain Assessment: Faces Faces Pain Scale: Hurts even more Pain Location: R flank, LUE Pain Descriptors / Indicators: Grimacing;Guarding;Sharp;Discomfort Pain Intervention(s): Monitored during session;Premedicated before session    Home Living                      Prior Function            PT Goals (current goals can now be found  in the care plan section) Acute Rehab PT Goals Patient Stated Goal: decrease pain PT Goal Formulation: With patient Time For Goal Achievement: 09/24/19 Potential to Achieve Goals: Good Progress towards PT goals: Progressing toward goals    Frequency    Min 4X/week      PT Plan Discharge plan needs to be updated    Co-evaluation              AM-PAC PT "6 Clicks" Mobility   Outcome Measure  Help needed turning from your back to your side while in a flat bed without using bedrails?: A Little Help needed moving from lying on your back to sitting on the side of a flat bed without using bedrails?: A Lot Help needed moving to and from a  bed to a chair (including a wheelchair)?: A Little Help needed standing up from a chair using your arms (e.g., wheelchair or bedside chair)?: A Little Help needed to walk in hospital room?: A Little Help needed climbing 3-5 steps with a railing? : A Little 6 Click Score: 17    End of Session   Activity Tolerance: Patient limited by pain Patient left: in bed;with call bell/phone within reach Nurse Communication: Mobility status PT Visit Diagnosis: Other abnormalities of gait and mobility (R26.89);Pain Pain - Right/Left: Left Pain - part of body: Arm;Shoulder     Time: 5638-7564 PT Time Calculation (min) (ACUTE ONLY): 33 min  Charges:  $Gait Training: 8-22 mins $Therapeutic Activity: 8-22 mins                     09/12/2019  Jacinto Halim., PT Acute Rehabilitation Services [phone]  (pager) [phone]  (office)   Keith Weber 09/12/2019, 6:12 PM

## 2019-09-12 NOTE — Progress Notes (Signed)
[facility] Progress Note     Subjective: CC-  Significant pain from rib fractures, worse when he needs to cough. Oral medication regimen helping some, but still required IV dilaudid x3 yesterday. Feels like he has phlegm that he cannot cough up. SOB worse with movement. Feels like breathing treatments helped yesterday. O2 sats stable on 1L [place]. Pulling 500 on IS. CXR stable without pneumothorax, persistent atelectasis.   ROS: See above, otherwise other systems negative   Objective: Vital signs in last 24 hours: Temp:  [98.5 F (36.9 C)-100.3 F (37.9 C)] 99.7 F (37.6 C) (02/02 0429) Pulse Rate:  [81-83] 82 (02/02 0424) Resp:  [20-24] 20 (02/02 0424) BP: (134-146)/(83-98) 141/98 (02/02 0424) SpO2:  [91 %-98 %] 93 % (02/02 0424) Last BM Date: 09/08/19  Intake/Output from previous day: 02/01 0701 - 02/02 0700 In: 360 [P.O.:360] Out: 520 [Urine:500; Chest Tube:20] Intake/Output this shift: No intake/output data recorded.  PE: PE: Gen:  Alert, NAD HEENT: EOM's intact, pupils equal and round Card:  RRR, 2+ DP pulses Pulm:  rhonchi bilaterally, no wheezing, rate and effort normal on 1L [place]. R CT in place Abd: Soft, NT/ND, +BS, no HSM Ext:  Calves soft and nontender without edema Psych: A&Ox3  Skin: no rashes noted, warm and dry   Lab Results:  Recent Labs    09/11/19 0509 09/12/19 0537  WBC 7.8 6.7  HGB 13.2 12.6*  HCT 39.5 36.8*  PLT 188 194   BMET Recent Labs    09/11/19 0509 09/12/19 0537  NA 135 134*  K 4.3 3.9  CL 99 100  CO2 25 26  GLUCOSE 110* 140*  BUN 13 10  CREATININE 1.21 1.25*  CALCIUM 8.9 8.4*   PT/INR Recent Labs    09/09/19 1217  LABPROT 13.3  INR 1.0   CMP     Component Value Date/Time   NA 134 (L) 09/12/2019 0537   K 3.9 09/12/2019 0537   CL 100 09/12/2019 0537   CO2 26 09/12/2019 0537   GLUCOSE 140 (H) 09/12/2019 0537   BUN 10 09/12/2019 0537   CREATININE 1.25 (H) 09/12/2019 0537   CALCIUM 8.4 (L) 09/12/2019  0537   PROT 7.0 09/09/2019 1217   ALBUMIN 3.6 09/09/2019 1217   AST 310 (H) 09/09/2019 1217   ALT 185 (H) 09/09/2019 1217   ALKPHOS 87 09/09/2019 1217   BILITOT 0.7 09/09/2019 1217   GFRNONAA >60 09/12/2019 0537   GFRAA >60 09/12/2019 0537   Lipase  No results found for: LIPASE     Studies/Results: DG CHEST PORT 1 VIEW  Result Date: 09/12/2019 CLINICAL DATA:  Follow-up right pneumothorax EXAM: PORTABLE CHEST 1 VIEW COMPARISON:  Yesterday FINDINGS: Right apical chest tube in stable position. No definite pneumothorax seen today. Stable chest wall emphysema. Streaky density and hazy density at the bases attributed atelectasis and probable pleural fluid. Rib and left scapular fractures.  Normal heart size. IMPRESSION: 1. No visible pneumothorax.  Stable chest wall emphysema. 2. Stable atelectasis and pleural fluid. Electronically Signed   By: Monte Fantasia M.D.   On: 09/12/2019 05:39   DG CHEST PORT 1 VIEW  Result Date: 09/11/2019 CLINICAL DATA:  Follow-up right-sided pneumothorax. EXAM: PORTABLE CHEST 1 VIEW COMPARISON:  Chest x-ray 09/09/2019 FINDINGS: The right-sided chest tube is stable. No residual right-sided pneumothorax is identified. Slight improved subcutaneous emphysema. Slight increase and bibasilar atelectasis. Stable right rib fractures. IMPRESSION: 1. Stable right-sided chest tube without residual pneumothorax. 2. Slight increase in bibasilar atelectasis, right  greater than left. Electronically Signed   By: Rudie Meyer M.D.   On: 09/11/2019 07:33   DG Shoulder Left Port  Result Date: 09/10/2019 CLINICAL DATA:  Fall from ladder 09/09/2019.  Left shoulder pain. EXAM: LEFT SHOULDER COMPARISON:  Chest CT 09/09/2019 FINDINGS: Mild widening of the [facility] joint. Mild lucency over the superior aspect of the scapula/scapular spine compatible with known fracture in this location on recent CT. Acute lateral left eighth and ninth rib fractures. IMPRESSION: 1. Evidence of fracture along the  superior left scapula/scapular spine. Displaced left lateral eighth and ninth rib fractures. 2.  Widened AC joint suggesting grade 2 separation. Electronically Signed   By: Elberta Fortis M.D.   On: 09/10/2019 10:13   DG Humerus Left  Result Date: 09/10/2019 CLINICAL DATA:  Lateral fall 09/09/2019 EXAM: LEFT HUMERUS - 2+ VIEW COMPARISON:  Left shoulder series same day. FINDINGS: No evidence of left humerus fracture. Other findings as described on patient's left shoulder films unchanged. IMPRESSION: No left humeral fracture. Electronically Signed   By: Elberta Fortis M.D.   On: 09/10/2019 10:14    Anti-infectives: Anti-infectives (From admission, onward)   None       Assessment/Plan Fall from ladder R Rib FX4-10with PTX- D/c chest tube today and repeat CXR at noon. Continue pulm toilet/IS and multimodal pain control L grade 2 AC joint separation, L scapula fx - per Dr. Charlann Boxer, nonop, sling LUE, follow up 2 weeks Nasal abrasion CKD- CRT around baseline 1.25 Asthma GERD Tobacco abuse ?Substance abuse  ID - none FEN - reg diet, colace/miralax VTE - SCDs, lovenox Foley - none Follow up - trauma, ortho  Plan - D/c chest tube, hopefully this will help with some of his pain and increase mobility. Continue current oral pain medication regimen and wean dilaudid. Continue scheduled duonebs. Continue therapies, still recommending CIR.     LOS: 3 days    Franne Forts, [facility] 09/12/2019, 7:35 AM Please see Amion for pager number during day hours 7:00am-4:30pm

## 2019-09-13 ENCOUNTER — Inpatient Hospital Stay (HOSPITAL_COMMUNITY): Payer: Self-pay

## 2019-09-13 LAB — CBC
HCT: 36.5 % — ABNORMAL LOW (ref 39.0–52.0)
Hemoglobin: 12.5 g/dL — ABNORMAL LOW (ref 13.0–17.0)
MCH: 33.7 pg (ref 26.0–34.0)
MCHC: 34.2 g/dL (ref 30.0–36.0)
MCV: 98.4 fL (ref 80.0–100.0)
Platelets: 228 10*3/uL (ref 150–400)
RBC: 3.71 MIL/uL — ABNORMAL LOW (ref 4.22–5.81)
RDW: 13.5 % (ref 11.5–15.5)
WBC: 5.5 10*3/uL (ref 4.0–10.5)
nRBC: 0 % (ref 0.0–0.2)

## 2019-09-13 LAB — BASIC METABOLIC PANEL
Anion gap: 9 (ref 5–15)
BUN: 13 mg/dL (ref 6–20)
CO2: 28 mmol/L (ref 22–32)
Calcium: 8.9 mg/dL (ref 8.9–10.3)
Chloride: 101 mmol/L (ref 98–111)
Creatinine, Ser: 1.25 mg/dL — ABNORMAL HIGH (ref 0.61–1.24)
GFR calc Af Amer: >60 mL/min (ref >60)
GFR calc non Af Amer: >60 mL/min (ref >60)
Glucose, Bld: 102 mg/dL — ABNORMAL HIGH (ref 70–99)
Potassium: 4.2 mmol/L (ref 3.5–5.1)
Sodium: 138 mmol/L (ref 135–145)

## 2019-09-13 LAB — MAGNESIUM: Magnesium: 2.5 mg/dL — ABNORMAL HIGH (ref 1.7–2.4)

## 2019-09-13 LAB — PHOSPHORUS: Phosphorus: 4.2 mg/dL (ref 2.5–4.6)

## 2019-09-13 MED ORDER — FLUTICASONE FUROATE-VILANTEROL 100-25 MCG/INH IN AEPB
1.0000 | INHALATION_SPRAY | Freq: Every day | RESPIRATORY_TRACT | Status: DC
  Administered 2019-09-13 – 2019-09-14 (×2): 1 via RESPIRATORY_TRACT
  Filled 2019-09-13: qty 28

## 2019-09-13 MED ORDER — IPRATROPIUM-ALBUTEROL 0.5-2.5 (3) MG/3ML IN SOLN
3.0000 mL | Freq: Four times a day (QID) | RESPIRATORY_TRACT | Status: DC | PRN
  Administered 2019-09-14: 07:00:00 3 mL via RESPIRATORY_TRACT
  Filled 2019-09-13: qty 3

## 2019-09-13 NOTE — TOC Initial Note (Signed)
Transition of Care [facility]) - Initial/Assessment Note    Patient Details  Name: Keith Weber MRN: [id number] Date of Birth: 03-16-1971  Transition of Care [facility]) CM/SW Contact:    Ella Bodo, RN Phone Number: 09/13/2019, 4:59 PM  Clinical Narrative: Pt is a 49 y.o. male who presents to the ED via EMS after falling from a ladder while trimming trees. He sustained R pneumothorax, R rib fxs, L scapular fx, and L AC joint separation. PTA, pt independent and living with friends.  PT recommending no OP follow up/OT recommending CIR.  CIR has declined pt for admission on 2/1.  Pt states he plans to return home with his friends to assist.  Pt uninsured, and is followed at [facility] for PCP.  Will provide follow up appt.  If pt dc on 2/4, recommend dc Rx sent to [facility] pharmacy to be filled using [place] letter.   SBIRT completed; pt admits to occasional ETOH use, but denies need for cessation resources.                  Expected Discharge Plan: [place] Barriers to Discharge: Continued Medical Work up   Patient Goals and CMS Choice        Expected Discharge Plan and Services Expected Discharge Plan: [place]   Discharge Planning Services: CM Consult, Medication Assistance, [facility], [place], Follow-up appt scheduled   Living arrangements for the past 2 months: Single Family Home                                      Prior Living Arrangements/Services Living arrangements for the past 2 months: Single Family Home Lives with:: Roommate, Friends Patient language and need for interpreter reviewed:: Yes Do you feel safe going back to the place where you live?: Yes      Need for Family Participation in Patient Care: No (Comment) Care giver support system in place?: Yes (comment)   Criminal Activity/Legal Involvement Pertinent to Current Situation/Hospitalization: No - Comment as needed  Activities of  Daily Living Home Assistive Devices/Equipment: None ADL Screening (condition at time of admission) Patient's cognitive ability adequate to safely complete daily activities?: Yes Is the patient deaf or have difficulty hearing?: No Does the patient have difficulty seeing, even when wearing glasses/contacts?: No Does the patient have difficulty concentrating, remembering, or making decisions?: No Patient able to express need for assistance with ADLs?: No Does the patient have difficulty dressing or bathing?: No Independently performs ADLs?: Yes (appropriate for developmental age) Does the patient have difficulty walking or climbing stairs?: No Weakness of Legs: None Weakness of Arms/Hands: None  Permission Sought/Granted                  Emotional Assessment Appearance:: Appears stated age Attitude/Demeanor/Rapport: Engaged Affect (typically observed): Accepting Orientation: : Oriented to Self, Oriented to Place, Oriented to  Time, Oriented to Situation Alcohol / Substance Use: Alcohol Use Psych Involvement: No (comment)  Admission diagnosis:  Pneumothorax on right [J93.9] Patient Active Problem List   Diagnosis Date Noted  . Pneumothorax on right 09/09/2019  . CAP (community acquired pneumonia) 01/21/2016  . Right middle lobe pneumonia 01/21/2016  . Glucosuria with normal serum glucose 01/21/2016  . Lactic acidosis 01/21/2016  . CKD (chronic kidney disease) stage 3, GFR 30-59 ml/min 01/21/2016  . Cigarette smoker 06/12/2015  .  COPD pfts pending/ still smoking  06/11/2015  . Asthma, chronic 10/16/2014  . Closed fracture of right thumb 06/16/2014   PCP:  Claiborne Rigg, NP Pharmacy:   [facility] - [place], [place] - [address] [place] [address] Phone: [phone] Fax: [phone]  [facility] [address], [place] - [address] [place] [address] Phone: [phone]  Fax: [phone]  [id number] [place], [place] - [address] AT [facility] [address] [place] [address] Phone: [phone] Fax: [phone]     Social Determinants of Health (SDOH) Interventions    Readmission Risk Interventions Readmission Risk Prevention Plan 09/13/2019  Transportation Screening Complete  PCP or Specialist Appt within 5-7 Days Complete  Home Care Screening Complete  Medication Review (RN CM) Complete  Some recent data might be hidden   Quintella Baton, RN, BSN  Trauma/Neuro ICU Case Manager [phone]

## 2019-09-13 NOTE — Progress Notes (Signed)
[facility] [place] [facility] Progress Note     Subjective: CC-  Sitting up in bed eating breakfast. Pain somewhat more controlled. Has been able to cough up more phlegm. No IV narcotics in >24 hours. Off supplemental oxygen. Pulling 600 on IS. Ambulated in the halls with therapy yesterday. CIR has denied approval, working towards home with [facility] PT vs home with no therapy follow up if he progresses well.  ROS: See above, otherwise other systems negative   Objective: Vital signs in last 24 hours: Temp:  [98.2 F (36.8 C)-99.1 F (37.3 C)] 98.9 F (37.2 C) (02/03 0345) Pulse Rate:  [78-86] 78 (02/03 0838) Resp:  [16-31] 16 (02/03 0838) BP: (121-151)/(91-98) 121/95 (02/03 0001) SpO2:  [92 %-97 %] 93 % (02/03 0838) Last BM Date: (PTA)  Intake/Output from previous day: 02/02 0701 - 02/03 0700 In: 420 [P.O.:420] Out: 1860 [Urine:1850; Chest Tube:10] Intake/Output this shift: No intake/output data recorded.  PE: Gen: Alert, NAD HEENT: EOM's intact, pupils equal and round Card: RRR, 2+ DP pulses Pulm: CTAB, no wheezing or rhonchi, rate andeffort normal on RA. Abd: Soft, NT/ND, +BS, no HSM DDU:KGURKY soft and nontender without edema Psych: A&Ox4 Skin: no rashes noted, warm and dry   Lab Results:  Recent Labs    09/11/19 0509 09/12/19 0537  WBC 7.8 6.7  HGB 13.2 12.6*  HCT 39.5 36.8*  PLT 188 194   BMET Recent Labs    09/11/19 0509 09/12/19 0537  NA 135 134*  K 4.3 3.9  CL 99 100  CO2 25 26  GLUCOSE 110* 140*  BUN 13 10  CREATININE 1.21 1.25*  CALCIUM 8.9 8.4*   PT/INR No results for input(s): LABPROT, INR in the last 72 hours. CMP     Component Value Date/Time   NA 134 (L) 09/12/2019 0537   K 3.9 09/12/2019 0537   CL 100 09/12/2019 0537   CO2 26 09/12/2019 0537   GLUCOSE 140 (H) 09/12/2019 0537   BUN 10 09/12/2019 0537   CREATININE 1.25 (H) 09/12/2019 0537   CALCIUM 8.4 (L) 09/12/2019 0537   PROT 7.0 09/09/2019 1217   ALBUMIN 3.6 09/09/2019 1217    AST 310 (H) 09/09/2019 1217   ALT 185 (H) 09/09/2019 1217   ALKPHOS 87 09/09/2019 1217   BILITOT 0.7 09/09/2019 1217   GFRNONAA >60 09/12/2019 0537   GFRAA >60 09/12/2019 0537   Lipase  No results found for: LIPASE     Studies/Results: DG CHEST PORT 1 VIEW  Result Date: 09/13/2019 CLINICAL DATA:  49 year old male with pneumothorax EXAM: PORTABLE CHEST 1 VIEW COMPARISON:  09/12/2019 FINDINGS: Cardiomediastinal silhouette unchanged in size and contour. Right heart border partially obscured by overlying lung/pleural disease. Hazy opacities at the bilateral right greater left lung bases. No visualized pneumothorax. Multiple right-sided rib fractures again demonstrated. Blunting of the left costophrenic angle. Similar appearance of subcutaneous/myofacial emphysema on the right chest wall. IMPRESSION: No visualized pneumothorax. Similar opacities at the right greater than left lung bases likely a combination contusion, atelectasis/consolidation, and small pleural fluid. Similar appearance of right chest wall myofacial/subcutaneous emphysema. Electronically Signed   By: Corrie Mckusick D.O.   On: 09/13/2019 08:02   DG CHEST PORT 1 VIEW  Result Date: 09/12/2019 CLINICAL DATA:  Right-sided chest tube removal. EXAM: PORTABLE CHEST 1 VIEW COMPARISON:  Prior chest x-ray same day.  Chest x-ray 09/11/2019. FINDINGS: Interim removal of right chest tube. Very tiny right apical pneumothorax cannot be excluded. Mediastinum hilar structures normal. Heart size normal. Persistent bibasilar  infiltrates, right side greater than left. Persistent small bilateral pleural effusions. Stable right chest wall subcutaneous emphysema. Multiple right rib fractures again noted. IMPRESSION: 1. Interim removal of right chest tube. Very tiny right apical pneumothorax cannot be excluded. Stable right chest wall subcutaneous emphysema. 2. Persistent bibasilar infiltrates, right side greater than left. Persistent small bilateral pleural  effusions. 3.  Multiple right rib fractures again noted. Critical Value/emergent results were called by telephone at the time of interpretation on 09/12/2019 at 1:13 pm to nurse [facility], who verbally acknowledged these results. Electronically Signed   By: Maisie Fus  Register   On: 09/12/2019 13:14   DG CHEST PORT 1 VIEW  Result Date: 09/12/2019 CLINICAL DATA:  Follow-up right pneumothorax EXAM: PORTABLE CHEST 1 VIEW COMPARISON:  Yesterday FINDINGS: Right apical chest tube in stable position. No definite pneumothorax seen today. Stable chest wall emphysema. Streaky density and hazy density at the bases attributed atelectasis and probable pleural fluid. Rib and left scapular fractures.  Normal heart size. IMPRESSION: 1. No visible pneumothorax.  Stable chest wall emphysema. 2. Stable atelectasis and pleural fluid. Electronically Signed   By: Marnee Spring M.D.   On: 09/12/2019 05:39    Anti-infectives: Anti-infectives (From admission, onward)   None       Assessment/Plan Fall from ladder R Rib FX4-10with PTX-chest tube out 2/2, follow up CXR without PNX today. Continue pulm toilet/IS and multimodal pain control L grade 2 AC joint separation, L scapula fx - per Dr. Charlann Boxer, nonop, sling LUE, follow up 2 weeks Nasal abrasion CKD- labs pending Asthma GERD Tobacco abuse ?Substance abuse  ID -none FEN -reg diet, colace/miralax VTE -SCDs, lovenox Foley -none Follow up- trauma, ortho  Plan- Labs pending. Continue therapies. Off supplemental oxygen and tolerating pulm toilet better. Home once mobilizing better, will see how he does with therapies today. May be ready for discharge as early as this afternoon, versus more likely tomorrow.   LOS: 4 days    Franne Forts, [facility] 09/13/2019, 9:06 AM Please see Amion for pager number during day hours 7:00am-4:30pm

## 2019-09-13 NOTE — Discharge Summary (Signed)
[facility] Discharge Summary   Patient ID: Keith Weber MRN: [id number] DOB/AGE: [birth year] 49 y.o.  Admit date: 09/09/2019 Discharge date: 09/14/2019  Admitting Diagnosis: Fall from ladder Right Rib fracture 4-10 with pneumothorax Nasal abrasion CKD   Discharge Diagnosis Patient Active Problem List   Diagnosis Date Noted  . Pneumothorax on right 09/09/2019  . CAP (community acquired pneumonia) 01/21/2016  . Right middle lobe pneumonia 01/21/2016  . Glucosuria with normal serum glucose 01/21/2016  . Lactic acidosis 01/21/2016  . CKD (chronic kidney disease) stage 3, GFR 30-59 ml/min 01/21/2016  . Cigarette smoker 06/12/2015  . COPD pfts pending/ still smoking  06/11/2015  . Asthma, chronic 10/16/2014  . Closed fracture of right thumb 06/16/2014    Consultants None  Imaging: DG CHEST PORT 1 VIEW  Result Date: 09/13/2019 CLINICAL DATA:  49 year old male with pneumothorax EXAM: PORTABLE CHEST 1 VIEW COMPARISON:  09/12/2019 FINDINGS: Cardiomediastinal silhouette unchanged in size and contour. Right heart border partially obscured by overlying lung/pleural disease. Hazy opacities at the bilateral right greater left lung bases. No visualized pneumothorax. Multiple right-sided rib fractures again demonstrated. Blunting of the left costophrenic angle. Similar appearance of subcutaneous/myofacial emphysema on the right chest wall. IMPRESSION: No visualized pneumothorax. Similar opacities at the right greater than left lung bases likely a combination contusion, atelectasis/consolidation, and small pleural fluid. Similar appearance of right chest wall myofacial/subcutaneous emphysema. Electronically Signed   By: Gilmer Mor D.O.   On: 09/13/2019 08:02   DG CHEST PORT 1 VIEW  Result Date: 09/12/2019 CLINICAL DATA:  Right-sided chest tube removal. EXAM: PORTABLE CHEST 1 VIEW COMPARISON:  Prior chest x-ray same day.  Chest x-ray 09/11/2019. FINDINGS: Interim removal of right  chest tube. Very tiny right apical pneumothorax cannot be excluded. Mediastinum hilar structures normal. Heart size normal. Persistent bibasilar infiltrates, right side greater than left. Persistent small bilateral pleural effusions. Stable right chest wall subcutaneous emphysema. Multiple right rib fractures again noted. IMPRESSION: 1. Interim removal of right chest tube. Very tiny right apical pneumothorax cannot be excluded. Stable right chest wall subcutaneous emphysema. 2. Persistent bibasilar infiltrates, right side greater than left. Persistent small bilateral pleural effusions. 3.  Multiple right rib fractures again noted. Critical Value/emergent results were called by telephone at the time of interpretation on 09/12/2019 at 1:13 pm to nurse [facility], who verbally acknowledged these results. Electronically Signed   By: Maisie Fus  Register   On: 09/12/2019 13:14   DG CHEST PORT 1 VIEW  Result Date: 09/12/2019 CLINICAL DATA:  Follow-up right pneumothorax EXAM: PORTABLE CHEST 1 VIEW COMPARISON:  Yesterday FINDINGS: Right apical chest tube in stable position. No definite pneumothorax seen today. Stable chest wall emphysema. Streaky density and hazy density at the bases attributed atelectasis and probable pleural fluid. Rib and left scapular fractures.  Normal heart size. IMPRESSION: 1. No visible pneumothorax.  Stable chest wall emphysema. 2. Stable atelectasis and pleural fluid. Electronically Signed   By: Marnee Spring M.D.   On: 09/12/2019 05:39    Procedures Dr. Janee Morn (09/09/2019) - Right chest tube insertion  Hospital Course:  Keith Weber is a 49yo male who presented to [facility] 1/30 after falling about 10 feet from a ladder while cutting tree limbs.  No LOC. He came in as a non-trauma but was upgraded to a level 2. A CXR was done showing 40% R PTX and he was upgraded to a level 1 by the EDP. His BP has been normal. He C/O pain R ribs and with deep breaths. Also  C/O back pain. Right chest tube placed  in the ED. CT scans also revealed posterior right upper and lower lobe pulmonary contusion, multiple displaced fractures involving the right lateral 2nd through 11th ribs, and no other acute injuries. Patient was admitted to the trauma service for pain control and pulmonary toilet. Serial chest xrays were followed and once pneumothorax resolved the chest tube was successfully removed on 2/2. Pain control initially very difficult, but this did improve with time and multimodal therapies. On 2/4, the patient was ambulating well, pain well controlled, vital signs stable and felt stable for discharge home.  Patient will follow up as below and knows to call with questions or concerns.    I have personally reviewed the patients medication history on the [place] controlled substance database.    Allergies as of 09/14/2019      Reactions   Aspirin Anaphylaxis   Peanut-containing Drug Products Anaphylaxis   Shellfish Allergy Anaphylaxis, Shortness Of Breath   Amoxicillin Hives   Has patient had a PCN reaction causing immediate rash, facial/tongue/throat swelling, SOB or lightheadedness with hypotension:  Has patient had a PCN reaction causing severe rash involving mucus membranes or skin necrosis:  Has patient had a PCN reaction that required hospitalization Yes Has patient had a PCN reaction occurring within the last 10 years: If all of the above answers are "NO", then may proceed with Cephalosporin use.   Iodine Nausea And Vomiting, Swelling   Kiwi Extract Swelling   Naproxen Hives   Latex Itching, Rash      Medication List    STOP taking these medications   chlorhexidine 0.12 % solution Commonly known as: Peridex   doxycycline 100 MG capsule Commonly known as: VIBRAMYCIN   HYDROcodone-acetaminophen 5-325 MG tablet Commonly known as: NORCO/VICODIN   pantoprazole 40 MG tablet Commonly known as: Protonix   varenicline 0.5 MG tablet Commonly known as: Chantix     TAKE these medications    acetaminophen 500 MG tablet Commonly known as: TYLENOL Take 2 tablets (1,000 mg total) by mouth every 6 (six) hours as needed.   albuterol 108 (90 Base) MCG/ACT inhaler Commonly known as: VENTOLIN HFA Inhale 1-2 puffs into the lungs every 6 (six) hours as needed for wheezing or shortness of breath. What changed: how much to take   docusate sodium 100 MG capsule Commonly known as: COLACE Take 1 capsule (100 mg total) by mouth 2 (two) times daily.   fluticasone furoate-vilanterol 100-25 MCG/INH Aepb Commonly known as: Breo Ellipta Inhale 1 puff into the lungs every morning. What changed: when to take this   guaiFENesin 600 MG 12 hr tablet Commonly known as: MUCINEX Take 1 tablet (600 mg total) by mouth 2 (two) times daily. To loosen phlegm/mucus   ipratropium-albuterol 0.5-2.5 (3) MG/3ML Soln Commonly known as: DUONEB Take 3 mLs by nebulization every 6 (six) hours as needed. What changed: reasons to take this   methocarbamol 500 MG tablet Commonly known as: ROBAXIN Take 2 tablets (1,000 mg total) by mouth every 8 (eight) hours as needed for muscle spasms.   multivitamin with minerals Tabs tablet Take 1 tablet by mouth daily.   Oxycodone HCl 10 MG Tabs Take 1-1.5 tablets (10-15 mg total) by mouth every 4 (four) hours as needed for moderate pain or severe pain.   polyethylene glycol 17 g packet Commonly known as: MIRALAX / GLYCOLAX Take 17 g by mouth daily. Use this while taking narcotics to avoid constipation   sodium chloride 0.65 % Soln nasal  spray Commonly known as: OCEAN Place 1 spray into both nostrils as needed for congestion.            Durable Medical Equipment  (From admission, onward)         Start     Ordered   09/14/19 1439  For home use only DME Walker rolling  Once    Question Answer Comment  Walker: With 5 Inch Wheels   Patient needs a walker to treat with the following condition Rib fractures      09/14/19 1439   09/14/19 1439  For home use  only DME 3 n 1  Once     09/14/19 1439           Follow-up Information    [facility]. Go on 09/28/2019.   Why: Your appointment is 09/28/2019 at 9:40am. Please arrive 30 minutes prior to your appointment to check in and fill out paperwork. Bring photo ID and any insurance information. Contact information: [place] [phone] [place]. Go on 09/27/2019.   Why: You need to have a chest xray at [place] or [facility] prior to your appointment in trauma clinic. You do not have to have an appointment.  Contact information: [place] [address] [phone]        Gildardo Pounds, NP Follow up on 09/20/2019.   Specialty: Nurse Practitioner Why: 10:50am for hospital followup. This will be a televisit. Contact information: [place] [address] [phone]        Paralee Cancel, MD. Schedule an appointment as soon as possible for a visit in 1 week(s).   Specialty: Orthopedic Surgery Why: call to arrange follow up regarding shoulder injury Contact information: [address] [place] [address] [phone]           Signed: Wellington Hampshire, [facility] 09/13/2019, 1:49 PM Please see Amion for pager number during day hours 7:00am-4:30pm

## 2019-09-13 NOTE — Progress Notes (Signed)
Occupational Therapy Treatment Patient Details Name: Latisha Vanlenten MRN: [id number] DOB: Jun 02, 1971 Today's Date: 09/13/2019    History of present illness Pt is a 49 y.o. male who presents to the ED via EMS after falling from a ladder while trimming trees. He sustained R pneumothorax, R rib fxs, L scapular fx, and L AC joint separation.   OT comments  Pt progressing to OOB ADL. Pt limited severely by pain, decreased ability to use BUEs due to pain, decreased strength and decreased activity tolerance. Pt with limited activity tolerance from standing for UB grooming tasks at sink ~4 mins of standing; balance challenged and pt often requiring 1 UE supported for stability. Pt using RW for stability in standing and continues to be limited by rib pain. Pt education for LB dressing with figure four ability. Pt very lethargic and walked with PT earlier today. Pt increasing in function, but worrisome that pt lives alone. Pt would benefit from CIR for higher level ADL and mobility to return home Mod I. OT following acutely.    Follow Up Recommendations  CIR    Equipment Recommendations  3 in 1 bedside commode    Recommendations for Other Services      Precautions / Restrictions Precautions Precautions: Fall Other Brace: pt declines the use of the sling on L UE Restrictions Other Position/Activity Restrictions: Limited WB with LUE on RW       Mobility Bed Mobility Overal bed mobility: Needs Assistance Bed Mobility: Supine to Sit     Supine to sit: Min guard     General bed mobility comments: pt seated in recliner  Transfers Overall transfer level: Needs assistance   Transfers: Sit to/from Stand Sit to Stand: Min guard         General transfer comment: no assist, but slow using R>L UE    Balance Overall balance assessment: Needs assistance   Sitting balance-Leahy Scale: Fair Sitting balance - Comments: pt reluctantly drew each leg up to donn his own socks.   Standing  balance support: Bilateral upper extremity supported Standing balance-Leahy Scale: Fair Standing balance comment: requiring RW for stability                           ADL either performed or assessed with clinical judgement   ADL Overall ADL's : Needs assistance/impaired     Grooming: Set up;Standing Grooming Details (indicate cue type and reason): Pt with limited activity tolerance from standing for UB grooming tasks at sink ~4 mins of standing; balance challenged and pt often requiring 1 UE supported for stability.             Lower Body Dressing: Min guard;Minimal assistance;Sitting/lateral leans;Sit to/from stand Lower Body Dressing Details (indicate cue type and reason): Pt able to perform figure 4 technique with increased time Toilet Transfer: Min guard;Ambulation;Cueing for safety           Functional mobility during ADLs: Min guard;Rolling walker;Cueing for safety General ADL Comments: Pt focused on grooming in standing at sink with supervisionA. Pt limited severely by pain, decreased ability to use BUEs due to pain, decreased strength and decreased activity tolerance.     Vision       Perception     Praxis      Cognition Arousal/Alertness: Awake/alert Behavior During Therapy: WFL for tasks assessed/performed Overall Cognitive Status: Within Functional Limits for tasks assessed  Exercises     Shoulder Instructions       General Comments Pt >90% O2 on RA    Pertinent Vitals/ Pain       Pain Assessment: Faces Faces Pain Scale: Hurts little more Pain Location: R flank Pain Descriptors / Indicators: Grimacing;Guarding Pain Intervention(s): Monitored during session;Premedicated before session  Home Living                                          Prior Functioning/Environment              Frequency  Min 2X/week        Progress Toward Goals  OT  Goals(current goals can now be found in the care plan section)  Progress towards OT goals: Progressing toward goals  Acute Rehab OT Goals Patient Stated Goal: decrease pain OT Goal Formulation: With patient Time For Goal Achievement: 09/25/19 Potential to Achieve Goals: Good ADL Goals Pt Will Perform Grooming: with supervision;standing Pt Will Perform Lower Body Dressing: with min guard assist;sit to/from stand;sitting/lateral leans;with adaptive equipment Pt Will Transfer to Toilet: with min guard assist;ambulating;bedside commode Pt Will Perform Toileting - Clothing Manipulation and hygiene: with min guard assist;sitting/lateral leans;sit to/from stand Pt/caregiver will Perform Home Exercise Program: Increased strength;Increased ROM;Both right and left upper extremity  Plan Discharge plan remains appropriate    Co-evaluation                 AM-PAC OT "6 Clicks" Daily Activity     Outcome Measure   Help from another person eating meals?: None Help from another person taking care of personal grooming?: A Little Help from another person toileting, which includes using toliet, bedpan, or urinal?: A Little Help from another person bathing (including washing, rinsing, drying)?: A Lot Help from another person to put on and taking off regular upper body clothing?: A Little Help from another person to put on and taking off regular lower body clothing?: A Little 6 Click Score: 18    End of Session Equipment Utilized During Treatment: Rolling walker;Oxygen  OT Visit Diagnosis: Muscle weakness (generalized) (M62.81);Pain;Unsteadiness on feet (R26.81) Pain - Right/Left: Right Pain - part of body: (ribs)   Activity Tolerance Patient limited by pain   Patient Left in chair;with call bell/phone within reach   Nurse Communication Mobility status        Time: 1610-9604 OT Time Calculation (min): 24 min  Charges: OT General Charges $OT Visit: 1 Visit OT Treatments $Self  Care/Home Management : 8-22 mins $Therapeutic Activity: 8-22 mins  Flora Lipps OTR/L Acute Rehabilitation Services Pager: [phone] Office: [phone]   Kayah Hecker C 09/13/2019, 3:19 PM

## 2019-09-13 NOTE — Discharge Instructions (Signed)
PNEUMOTHORAX OR HEMOTHORAX +/- RIB FRACTURES  HOME INSTRUCTIONS   1. PAIN CONTROL:  1. Pain is best controlled by a usual combination of three different methods TOGETHER:  i. Ice/Heat ii. Over the counter pain medication iii. Prescription pain medication 2. You may experience some swelling and bruising in area of broken ribs. Ice packs or heating pads (30-60 minutes up to 6 times a day) will help. Use ice for the first few days to help decrease swelling and bruising, then switch to heat to help relax tight/sore spots and speed recovery. Some people prefer to use ice alone, heat alone, alternating between ice & heat. Experiment to what works for you. Swelling and bruising can take several weeks to resolve.  3. It is helpful to take an over-the-counter pain medication regularly for the first few weeks. Choose one of the following that works best for you:  i. Naproxen (Aleve, etc) Two 220mg tabs twice a day ii. Ibuprofen (Advil, etc) Three 200mg tabs four times a day (every meal & bedtime) iii. Acetaminophen (Tylenol, etc) 500-650mg four times a day (every meal & bedtime) 4. A prescription for pain medication (such as oxycodone, hydrocodone, etc) may be given to you upon discharge. Take your pain medication as prescribed.  i. If you are having problems/concerns with the prescription medicine (does not control pain, nausea, vomiting, rash, itching, etc), please call us [phone] to see if we need to switch you to a different pain medicine that will work better for you and/or control your side effect better. ii. If you need a refill on your pain medication, please contact your pharmacy. They will contact our office to request authorization. Prescriptions will not be filled after 5 pm or on week-ends. 1. Avoid getting constipated. When taking pain medications, it is common to experience some constipation. Increasing fluid intake and taking a fiber supplement (such as Metamucil, Citrucel, FiberCon,  MiraLax, etc) 1-2 times a day regularly will usually help prevent this problem from occurring. A mild laxative (prune juice, Milk of Magnesia, MiraLax, etc) should be taken according to package directions if there are no bowel movements after 48 hours.  2. Watch out for diarrhea. If you have many loose bowel movements, simplify your diet to bland foods & liquids for a few days. Stop any stool softeners and decrease your fiber supplement. Switching to mild anti-diarrheal medications (Kayopectate, Pepto Bismol) can help. If this worsens or does not improve, please call us. 3. Chest tube site wound: you may remove the dressing from your chest tube site 3 days after the removal of your chest tube. DO NOT shower over the dressing. Once   removed, you may shower as normal. Do not submerge your wound in water for 2-3 weeks.  4. FOLLOW UP  a. Please call our office to set up or confirm an appointment for follow up for 2 weeks after discharge. You will need to get a chest xray at either [facility] or [facility]. This will be outlined in your follow up instructions. Please call [facility] at [phone] if you have any questions about follow up.  b. If you have any orthopedic or other injuries you will need to follow up as outlined in your follow up instructions.   WHEN TO CALL US [phone]:  1. Poor pain control 2. Reactions / problems with new medications (rash/itching, nausea, etc)  3. Fever over 101.5 F (38.5 C) 4. Worsening swelling or bruising 5. Redness, drainage, pain or swelling around chest   tube site 6. Worsening pain, productive cough, difficulty breathing or any other concerning symptoms  The clinic staff is available to answer your questions during regular business hours (8:30am-5pm). Please don't hesitate to call and ask to speak to one of our nurses for clinical concerns.  If you have a medical emergency, go to the nearest emergency room or call 911.  A surgeon from [facility] is always on call at the hospitals   [facility]  [address], [address], [place], [place] [address] ?  MAIN: [phone] ? TOLL FREE: [phone] ?  FAX [phone]  [email or web address]      Information on Rib Fractures  A rib fracture is a break or crack in one of the bones of the ribs. The ribs are long, curved bones that wrap around your chest and attach to your spine and your breastbone. The ribs protect your heart, lungs, and other organs in the chest. A broken or cracked rib is often painful but is not usually serious. Most rib fractures heal on their own over time. However, rib fractures can be more serious if multiple ribs are broken or if broken ribs move out of place and push against other structures or organs. What are the causes? This condition is caused by:  Repetitive movements with high force, such as pitching a baseball or having severe coughing spells.  A direct blow to the chest, such as a sports injury, a car accident, or a fall.  Cancer that has spread to the bones, which can weaken bones and cause them to break. What are the signs or symptoms? Symptoms of this condition include:  Pain when you breathe in or cough.  Pain when someone presses on the injured area.  Feeling short of breath. How is this diagnosed? This condition is diagnosed with a physical exam and medical history. Imaging tests may also be done, such as:  Chest X-ray.  CT scan.  MRI.  Bone scan.  Chest ultrasound. How is this treated? Treatment for this condition depends on the severity of the fracture. Most rib fractures usually heal on their own in 1-3 months. Sometimes healing takes longer if there is a cough that does not stop or if there are other activities that make the injury worse (aggravating factors). While you heal, you will be given medicines to control the pain. You will also be taught deep breathing  exercises. Severe injuries may require hospitalization or surgery. Follow these instructions at home: Managing pain, stiffness, and swelling  If directed, apply ice to the injured area. ? Put ice in a plastic bag. ? Place a towel between your skin and the bag. ? Leave the ice on for 20 minutes, 2-3 times a day.  Take over-the-counter and prescription medicines only as told by your health care provider. Activity  Avoid a lot of activity and any activities or movements that cause pain. Be careful during activities and avoid bumping the injured rib.  Slowly increase your activity as told by your health care provider. General instructions  Do deep breathing exercises as told by your health care provider. This helps prevent pneumonia, which is a common complication of a broken rib. Your health care provider may instruct you to: ? Take deep breaths several times a day. ? Try to cough several times a day, holding a pillow against the injured area. ? Use a device called incentive spirometer to practice deep breathing several times a day.  Drink enough   fluid to keep your urine pale yellow.  Do not wear a rib belt or binder. These restrict breathing, which can lead to pneumonia.  Keep all follow-up visits as told by your health care provider. This is important. Contact a health care provider if:  You have a fever. Get help right away if:  You have difficulty breathing or you are short of breath.  You develop a cough that does not stop, or you cough up thick or bloody sputum.  You have nausea, vomiting, or pain in your abdomen.  Your pain gets worse and medicine does not help. Summary  A rib fracture is a break or crack in one of the bones of the ribs.  A broken or cracked rib is often painful but is not usually serious.  Most rib fractures heal on their own over time.  Treatment for this condition depends on the severity of the fracture.  Avoid a lot of activity and any  activities or movements that cause pain. This information is not intended to replace advice given to you by your health care provider. Make sure you discuss any questions you have with your health care provider. Document Released: 07/27/2005 Document Revised: 10/26/2016 Document Reviewed: 10/26/2016 Elsevier Interactive Patient Education  2019 [employer]    Pneumothorax A pneumothorax is commonly called a collapsed lung. It is a condition in which air leaks from a lung and builds up between the thin layer of tissue that covers the lungs (visceral pleura) and the interior wall of the chest cavity (parietal pleura). The air gets trapped outside the lung, between the lung and the chest wall (pleural space). The air takes up space and prevents the lung from fully expanding. This condition sometimes occurs suddenly with no apparent cause. The buildup of air may be small or large. A small pneumothorax may go away on its own. A large pneumothorax will require treatment and hospitalization. What are the causes? This condition may be caused by:  Trauma and injury to the chest wall.  Surgery and other medical procedures.  A complication of an underlying lung problem, especially chronic obstructive pulmonary disease (COPD) or emphysema. Sometimes the cause of this condition is not known. What increases the risk? You are more likely to develop this condition if:  You have an underlying lung problem.  You smoke.  You are 20-40 years old, male, tall, and underweight.  You have a personal or family history of pneumothorax.  You have an eating disorder (anorexia nervosa). This condition can also happen quickly, even in people with no history of lung problems. What are the signs or symptoms? Sometimes a pneumothorax will have no symptoms. When symptoms are present, they can include:  Chest pain.  Shortness of breath.  Increased rate of breathing.  Bluish color to your lips or skin  (cyanosis). How is this diagnosed? This condition may be diagnosed by:  A medical history and physical exam.  A chest X-ray, chest CT scan, or ultrasound. How is this treated? Treatment depends on how severe your condition is. The goal of treatment is to remove the extra air and allow your lung to expand back to its normal size.  For a small pneumothorax: ? No treatment may be needed. ? Extra oxygen is sometimes used to make it go away more quickly.  For a large pneumothorax or a pneumothorax that is causing symptoms, a procedure is done to drain the air from your lungs. To do this, a health care provider may   use: ? A needle with a syringe. This is used to suck air from a pleural space where no additional leakage is taking place. ? A chest tube. This is used to suck air where there is ongoing leakage into the pleural space. The chest tube may need to remain in place for several days until the air leak has healed.  In more severe cases, surgery may be needed to repair the damage that is causing the leak.  If you have multiple pneumothorax episodes or have an air leak that will not heal, a procedure called a pleurodesis may be done. A medicine is placed in the pleural space to irritate the tissues around the lung so that the lung will stick to the chest wall, seal any leaks, and stop any buildup of air in that space. If you have an underlying lung problem, severe symptoms, or a large pneumothorax you will usually need to stay in the hospital. Follow these instructions at home: Lifestyle  Do not use any products that contain nicotine or tobacco, such as cigarettes and e-cigarettes. These are major risk factors in pneumothorax. If you need help quitting, ask your health care provider.  Do not lift anything that is heavier than 10 lb (4.5 kg), or the limit that your health care provider tells you, until he or she says that it is safe.  Avoid activities that take a lot of effort (strenuous)  for as long as told by your health care provider.  Return to your normal activities as told by your health care provider. Ask your health care provider what activities are safe for you.  Do not fly in an airplane or scuba dive until your health care provider says it is okay. General instructions  Take over-the-counter and prescription medicines only as told by your health care provider.  If a cough or pain makes it difficult for you to sleep at night, try sleeping in a semi-upright position in a recliner or by using 2 or 3 pillows.  If you had a chest tube and it was removed, ask your health care provider when you can remove the bandage (dressing). While the dressing is in place, do not allow it to get wet.  Keep all follow-up visits as told by your health care provider. This is important. Contact a health care provider if:  You cough up thick mucus (sputum) that is yellow or green in color.  You were treated with a chest tube, and you have redness, increasing pain, or discharge at the site where it was placed. Get help right away if:  You have increasing chest pain or shortness of breath.  You have a cough that will not go away.  You begin coughing up blood.  You have pain that is getting worse or is not controlled with medicines.  The site where your chest tube was located opens up.  You feel air coming out of the site where the chest tube was placed.  You have a fever or persistent symptoms for more than 2-3 days.  You have a fever and your symptoms suddenly get worse. These symptoms may represent a serious problem that is an emergency. Do not wait to see if the symptoms will go away. Get medical help right away. Call your local emergency services (911 in the [place] Do not drive yourself to the hospital. Summary  A pneumothorax, commonly called a collapsed lung, is a condition in which air leaks from a lung and gets trapped between the   lung and the chest wall (pleural  space).  The buildup of air may be small or large. A small pneumothorax may go away on its own. A large pneumothorax will require treatment and hospitalization.  Treatment for this condition depends on how severe the pneumothorax is. The goal of treatment is to remove the extra air and allow the lung to expand back to its normal size. This information is not intended to replace advice given to you by your health care provider. Make sure you discuss any questions you have with your health care provider. Document Released: 07/27/2005 Document Revised: 07/05/2017 Document Reviewed: 07/05/2017 Elsevier Interactive Patient Education  2019 [employer]   

## 2019-09-13 NOTE — Progress Notes (Signed)
Physical Therapy Treatment Patient Details Name: Keith Weber MRN: [id number] DOB: Jan 13, 1971 Today's Date: 09/13/2019    History of Present Illness Pt is a 49 y.o. male who presents to the ED via EMS after falling from a ladder while trimming trees. He sustained R pneumothorax, R rib fxs, L scapular fx, and L AC joint separation.    PT Comments    Still hasn't followed through with multiple walks with staff in the halls.  Charged pt with completing at least three circuits of the unit today.  Episode on best bed mobility technique and best transfer technique.  Encouraged pt to do more for himself.  Progressed ambulation with RW to see if stability and stamina can be improved on.    Follow Up Recommendations  No PT follow up;Home health PT;Other (comment)     Equipment Recommendations  Other (comment)(TBD, may need RW for the short term)    Recommendations for Other Services       Precautions / Restrictions Precautions Precautions: Fall Other Brace: pt declines the use of the sling on L UE Restrictions Other Position/Activity Restrictions: pt allowed to use RW, but with little L UE w/bearing    Mobility  Bed Mobility Overal bed mobility: Needs Assistance Bed Mobility: Supine to Sit     Supine to sit: Min guard     General bed mobility comments: pt not listening to advice to roll up onto R UE, but slowly with effort got up to EOB  Transfers Overall transfer level: Needs assistance   Transfers: Sit to/from Stand Sit to Stand: Min guard         General transfer comment: no assist, but slow using R>L UE  Ambulation/Gait Ambulation/Gait assistance: Min guard Gait Distance (Feet): 250 Feet Assistive device: Rolling walker (2 wheeled) Gait Pattern/deviations: Step-through pattern;Decreased step length - right;Decreased step length - left;Decreased stride length Gait velocity: slower Gait velocity interpretation: 1.31 - 2.62 ft/sec, indicative of limited community  ambulator General Gait Details: short narrow steps.  Better posture and better stability with the RW.   Stairs             Wheelchair Mobility    Modified Rankin (Stroke Patients Only)       Balance Overall balance assessment: Needs assistance   Sitting balance-Leahy Scale: Fair(to good,) Sitting balance - Comments: pt reluctantly drew each leg up to donn his own socks.     Standing balance-Leahy Scale: Fair                              Cognition Arousal/Alertness: Awake/alert Behavior During Therapy: WFL for tasks assessed/performed Overall Cognitive Status: Within Functional Limits for tasks assessed                                        Exercises      General Comments General comments (skin integrity, edema, etc.): sats remained at about 90-92% on RA      Pertinent Vitals/Pain Pain Assessment: Faces Faces Pain Scale: Hurts little more Pain Location: R flank, LUE Pain Descriptors / Indicators: Grimacing;Guarding Pain Intervention(s): Monitored during session;Premedicated before session    Home Living                      Prior Function            PT  Goals (current goals can now be found in the care plan section) Acute Rehab PT Goals Patient Stated Goal: decrease pain PT Goal Formulation: With patient Time For Goal Achievement: 09/24/19 Potential to Achieve Goals: Good Progress towards PT goals: Progressing toward goals    Frequency    Min 4X/week      PT Plan Current plan remains appropriate    Co-evaluation              AM-PAC PT "6 Clicks" Mobility   Outcome Measure  Help needed turning from your back to your side while in a flat bed without using bedrails?: A Little Help needed moving from lying on your back to sitting on the side of a flat bed without using bedrails?: A Little Help needed moving to and from a bed to a chair (including a wheelchair)?: A Little Help needed standing up  from a chair using your arms (e.g., wheelchair or bedside chair)?: A Little Help needed to walk in hospital room?: A Little Help needed climbing 3-5 steps with a railing? : A Little 6 Click Score: 18    End of Session   Activity Tolerance: Patient tolerated treatment well Patient left: in chair;with call bell/phone within reach Nurse Communication: Mobility status PT Visit Diagnosis: Other abnormalities of gait and mobility (R26.89);Pain Pain - Right/Left: Left Pain - part of body: Arm;Shoulder     Time: 8119-1478 PT Time Calculation (min) (ACUTE ONLY): 29 min  Charges:  $Gait Training: 8-22 mins $Therapeutic Activity: 8-22 mins                     09/13/2019  Jacinto Halim., PT Acute Rehabilitation Services [phone]  (pager) [phone]  (office)   Eliseo Gum Syris Brookens 09/13/2019, 1:08 PM

## 2019-09-14 MED ORDER — SALINE SPRAY 0.65 % NA SOLN: 1.0000 | NASAL | 0 refills | Status: AC | PRN

## 2019-09-14 MED ORDER — ALBUTEROL SULFATE HFA 108 (90 BASE) MCG/ACT IN AERS: 1.0000 | INHALATION_SPRAY | Freq: Four times a day (QID) | RESPIRATORY_TRACT | 0 refills | Status: DC | PRN

## 2019-09-14 MED ORDER — GUAIFENESIN ER 600 MG PO TB12: 600.0000 mg | ORAL_TABLET | Freq: Two times a day (BID) | ORAL | Status: DC

## 2019-09-14 MED ORDER — OXYCODONE HCL 10 MG PO TABS: 10.0000 mg | ORAL_TABLET | ORAL | 0 refills | Status: DC | PRN

## 2019-09-14 MED ORDER — METHOCARBAMOL 500 MG PO TABS: 1000.0000 mg | ORAL_TABLET | Freq: Three times a day (TID) | ORAL | 0 refills | Status: DC | PRN

## 2019-09-14 MED ORDER — POLYETHYLENE GLYCOL 3350 17 G PO PACK: 17.0000 g | PACK | Freq: Every day | ORAL | 0 refills | Status: DC

## 2019-09-14 MED ORDER — SALINE SPRAY 0.65 % NA SOLN
1.0000 | NASAL | Status: DC | PRN
  Filled 2019-09-14: qty 44

## 2019-09-14 MED ORDER — DOCUSATE SODIUM 100 MG PO CAPS: 100.0000 mg | ORAL_CAPSULE | Freq: Two times a day (BID) | ORAL | 0 refills | Status: AC

## 2019-09-14 MED ORDER — ACETAMINOPHEN 500 MG PO TABS: 1000.0000 mg | ORAL_TABLET | Freq: Four times a day (QID) | ORAL | 0 refills | Status: AC | PRN

## 2019-09-14 MED ORDER — BISACODYL 10 MG RE SUPP
10.0000 mg | Freq: Once | RECTAL | Status: AC
  Administered 2019-09-14: 11:00:00 10 mg via RECTAL
  Filled 2019-09-14: qty 1

## 2019-09-14 MED FILL — ALBUTEROL SULFATE HFA 108 (: 108 (90 BAS | 25 days supply | Qty: 18 | Fill #0

## 2019-09-14 MED FILL — METHOCARBAMOL 500 MG TABS: 500 | 5 days supply | Qty: 30 | Fill #0

## 2019-09-14 MED FILL — oxyCODONE HCL 10 MG TABS: 10 | 3 days supply | Qty: 30 | Fill #0

## 2019-09-14 NOTE — Progress Notes (Signed)
[facility] Progress Note     Subjective: CC-  Overall improving. Still sore from rib fractures and not mobilizing a lot, but making progress. Feels like rolling walker helped yesterday. Plans to practice this with therapies again today. Coughing up more mucus. Able to pull up to 1000 on IS. O2 sats stable on RA. Tolerating diet. Has not had a good BM since admission. Passing flatus.  ROS: See above, otherwise other systems negative   Objective: Vital signs in last 24 hours: Temp:  [98 F (36.7 C)-100 F (37.8 C)] 98.3 F (36.8 C) (02/04 0751) Pulse Rate:  [73-85] 73 (02/04 0800) Resp:  [16-25] 23 (02/04 0800) BP: (125-147)/(81-92) 146/88 (02/04 0751) SpO2:  [91 %-96 %] 96 % (02/04 0800) Last BM Date: (pta)  Intake/Output from previous day: 02/03 0701 - 02/04 0700 In: -  Out: 1950 [Urine:1950] Intake/Output this shift: Total I/O In: 240 [P.O.:240] Out: -   PE: Gen: Alert, NAD HEENT: EOM's intact, pupils equal and round Card: RRR, 2+ DP pulses Pulm: CTAB, nowheezing or rhonchi, rate andeffort normal onRA. Abd: Soft, NT/ND, +BS, no HSM UVO:ZDGUYQ soft and nontender without edema Psych: A&Ox4 Skin: no rashes noted, warm and dry   Lab Results:  Recent Labs    09/12/19 0537 09/13/19 1015  WBC 6.7 5.5  HGB 12.6* 12.5*  HCT 36.8* 36.5*  PLT 194 228   BMET Recent Labs    09/12/19 0537 09/13/19 1015  NA 134* 138  K 3.9 4.2  CL 100 101  CO2 26 28  GLUCOSE 140* 102*  BUN 10 13  CREATININE 1.25* 1.25*  CALCIUM 8.4* 8.9   PT/INR No results for input(s): LABPROT, INR in the last 72 hours. CMP     Component Value Date/Time   NA 138 09/13/2019 1015   K 4.2 09/13/2019 1015   CL 101 09/13/2019 1015   CO2 28 09/13/2019 1015   GLUCOSE 102 (H) 09/13/2019 1015   BUN 13 09/13/2019 1015   CREATININE 1.25 (H) 09/13/2019 1015   CALCIUM 8.9 09/13/2019 1015   PROT 7.0 09/09/2019 1217   ALBUMIN 3.6 09/09/2019 1217   AST 310 (H) 09/09/2019 1217    ALT 185 (H) 09/09/2019 1217   ALKPHOS 87 09/09/2019 1217   BILITOT 0.7 09/09/2019 1217   GFRNONAA >60 09/13/2019 1015   GFRAA >60 09/13/2019 1015   Lipase  No results found for: LIPASE     Studies/Results: DG CHEST PORT 1 VIEW  Result Date: 09/13/2019 CLINICAL DATA:  49 year old male with pneumothorax EXAM: PORTABLE CHEST 1 VIEW COMPARISON:  09/12/2019 FINDINGS: Cardiomediastinal silhouette unchanged in size and contour. Right heart border partially obscured by overlying lung/pleural disease. Hazy opacities at the bilateral right greater left lung bases. No visualized pneumothorax. Multiple right-sided rib fractures again demonstrated. Blunting of the left costophrenic angle. Similar appearance of subcutaneous/myofacial emphysema on the right chest wall. IMPRESSION: No visualized pneumothorax. Similar opacities at the right greater than left lung bases likely a combination contusion, atelectasis/consolidation, and small pleural fluid. Similar appearance of right chest wall myofacial/subcutaneous emphysema. Electronically Signed   By: Gilmer Mor D.O.   On: 09/13/2019 08:02   DG CHEST PORT 1 VIEW  Result Date: 09/12/2019 CLINICAL DATA:  Right-sided chest tube removal. EXAM: PORTABLE CHEST 1 VIEW COMPARISON:  Prior chest x-ray same day.  Chest x-ray 09/11/2019. FINDINGS: Interim removal of right chest tube. Very tiny right apical pneumothorax cannot be excluded. Mediastinum hilar structures normal. Heart size normal. Persistent bibasilar infiltrates, right side  greater than left. Persistent small bilateral pleural effusions. Stable right chest wall subcutaneous emphysema. Multiple right rib fractures again noted. IMPRESSION: 1. Interim removal of right chest tube. Very tiny right apical pneumothorax cannot be excluded. Stable right chest wall subcutaneous emphysema. 2. Persistent bibasilar infiltrates, right side greater than left. Persistent small bilateral pleural effusions. 3.  Multiple right  rib fractures again noted. Critical Value/emergent results were called by telephone at the time of interpretation on 09/12/2019 at 1:13 pm to nurse [facility], who verbally acknowledged these results. Electronically Signed   By: Marcello Moores  Register   On: 09/12/2019 13:14    Anti-infectives: Anti-infectives (From admission, onward)   None       Assessment/Plan Fall from ladder R Rib FX4-10with PTX-chest tube out 2/2, follow up CXR without PNX today. Continue pulm toilet/IS andmultimodalpain control L grade 2 AC joint separation, L scapula fx - per Dr. Alvan Dame, nonop, sling LUE, follow up 2 weeks Nasal abrasion CKD- Cr 1.25 (2/3), around baseline Asthma GERD Tobacco abuse ?Substance abuse  ID -none FEN -reg diet, colace/miralax VTE -SCDs, lovenox Foley -none Follow up- trauma, ortho  Plan-Dulcolax suppository today. Continue therapies, will await their recommendations. Will recheck later today for probable discharge. Medications sent to [facility].   LOS: 5 days    [facility] 09/14/2019, 8:29 AM Please see Amion for pager number during day hours 7:00am-4:30pm

## 2019-09-14 NOTE — Progress Notes (Signed)
PM progress Note  Pt now mobilizing without the need for any assist.    09/14/19 1807  PT Visit Information  Last PT Received On 09/14/19  Assistance Needed +1  History of Present Illness Pt is a 49 y.o. male who presents to the ED via EMS after falling from a ladder while trimming trees. He sustained R pneumothorax, R rib fxs, L scapular fx, and L AC joint separation.  Subjective Data  Patient Stated Goal decrease pain  Precautions  Precautions Fall  Pain Assessment  Faces Pain Scale 4  Pain Location R flank  Pain Descriptors / Indicators Grimacing;Guarding  Pain Intervention(s) Monitored during session  Cognition  Arousal/Alertness Awake/alert  Behavior During Therapy WFL for tasks assessed/performed  Overall Cognitive Status Within Functional Limits for tasks assessed  Bed Mobility  Overal bed mobility Needs Assistance  Bed Mobility Supine to Sit  Supine to sit Supervision  General bed mobility comments pt showed that he could get OOB from a flat bed  Transfers  Overall transfer level Needs assistance  Transfers Sit to/from Stand  Sit to Stand Supervision  General transfer comment cues for safe hand placement  Ambulation/Gait  Ambulation/Gait assistance Supervision  Gait Distance (Feet) 240 Feet  Assistive device Rolling walker (2 wheeled)  Gait Pattern/deviations Step-through pattern;Decreased stride length;Narrow base of support  General Gait Details Pt still stiff and tentative, but generally showing improvement in gait stability/speed and posture  Gait velocity slower to moderate  Gait velocity interpretation 1.31 - 2.62 ft/sec, indicative of limited community ambulator  Stairs Yes  Stairs assistance Supervision  Stair Management One rail Right;Step to pattern;Forwards  Number of Stairs 6  General stair comments Safe with step to pattern.  Discussed how to use RW or rail  Balance  Sitting balance-Leahy Scale Fair  Standing balance support Bilateral upper  extremity supported;No upper extremity supported  Standing balance-Leahy Scale Fair  Standing balance comment preferring RW at this time  PT - End of Session  Activity Tolerance Patient tolerated treatment well  Patient left in chair;with call bell/phone within reach  Nurse Communication Mobility status   PT - Assessment/Plan  PT Plan Current plan remains appropriate  PT Visit Diagnosis Other abnormalities of gait and mobility (R26.89);Pain  Pain - part of body  (R flank)  PT Frequency (ACUTE ONLY) Min 4X/week  Follow Up Recommendations No PT follow up  PT equipment Rolling walker with 5" wheels;3in1 (PT)  AM-PAC PT "6 Clicks" Mobility Outcome Measure (Version 2)  Help needed turning from your back to your side while in a flat bed without using bedrails? 3  Help needed moving from lying on your back to sitting on the side of a flat bed without using bedrails? 3  Help needed moving to and from a bed to a chair (including a wheelchair)? 3  Help needed standing up from a chair using your arms (e.g., wheelchair or bedside chair)? 3  Help needed to walk in hospital room? 3  Help needed climbing 3-5 steps with a railing?  3  6 Click Score 18  Consider Recommendation of Discharge To: Home with [facility]  PT Goal Progression  Progress towards PT goals Progressing toward goals  Acute Rehab PT Goals  PT Goal Formulation With patient  Time For Goal Achievement 09/24/19  Potential to Achieve Goals Good  PT Time Calculation  PT Start Time (ACUTE ONLY) 1212  PT Stop Time (ACUTE ONLY) 1237  PT Time Calculation (min) (ACUTE ONLY) 25 min  PT General  Charges  $$ ACUTE PT VISIT 1 Visit  PT Treatments  $Gait Training 8-22 mins  $Therapeutic Activity 8-22 mins   09/14/2019  Ginger Carne., PT Acute Rehabilitation Services [phone]  (pager) [phone]  (office)

## 2019-09-14 NOTE — Progress Notes (Signed)
Physical Therapy Treatment Patient Details Name: Keith Weber MRN: [id number] DOB: 10-10-1970 Today's Date: 09/14/2019    History of Present Illness Pt is a 49 y.o. male who presents to the ED via EMS after falling from a ladder while trimming trees. He sustained R pneumothorax, R rib fxs, L scapular fx, and L AC joint separation.    PT Comments    Plan initially to get to the stairs, but limited to transition/transfer training and precautions with RW.  Pt needed much longer on the toilet.    Follow Up Recommendations  No PT follow up     Equipment Recommendations  Rolling walker with 5" wheels;3in1 (PT)    Recommendations for Other Services       Precautions / Restrictions Precautions Precautions: Fall    Mobility  Bed Mobility Overal bed mobility: Needs Assistance Bed Mobility: Supine to Sit     Supine to sit: Supervision Sit to supine: Min guard   General bed mobility comments: Cued for best technique.  Through unorthodox, pt manage to get up to EOB and back to supine slowly, but safely  Transfers Overall transfer level: Needs assistance Equipment used: 2 person hand held assist Transfers: Sit to/from Stand Sit to Stand: Min guard            Ambulation/Gait Ambulation/Gait assistance: Min guard Gait Distance (Feet): 15 Feet(to bathroom) Assistive device: Rolling walker (2 wheeled) Gait Pattern/deviations: Step-through pattern;Decreased step length - right;Decreased step length - left Gait velocity: slower   General Gait Details: pt still slow and somewhat stiff initially, but slowly gaining more upright posture   Stairs             Wheelchair Mobility    Modified Rankin (Stroke Patients Only)       Balance     Sitting balance-Leahy Scale: Fair       Standing balance-Leahy Scale: Fair                              Cognition Arousal/Alertness: Awake/alert Behavior During Therapy: WFL for tasks  assessed/performed Overall Cognitive Status: Within Functional Limits for tasks assessed                                        Exercises      General Comments General comments (skin integrity, edema, etc.): Treatment limited first attempt due to pt needed time on the toilet.      Pertinent Vitals/Pain Faces Pain Scale: Hurts little more Pain Location: R flank Pain Descriptors / Indicators: Grimacing;Guarding Pain Intervention(s): Monitored during session    Home Living                      Prior Function            PT Goals (current goals can now be found in the care plan section) Acute Rehab PT Goals Patient Stated Goal: decrease pain PT Goal Formulation: With patient Time For Goal Achievement: 09/24/19 Potential to Achieve Goals: Good Progress towards PT goals: Progressing toward goals    Frequency    Min 4X/week      PT Plan Current plan remains appropriate    Co-evaluation              AM-PAC PT "6 Clicks" Mobility   Outcome Measure  Help needed turning from your  back to your side while in a flat bed without using bedrails?: A Little Help needed moving from lying on your back to sitting on the side of a flat bed without using bedrails?: A Little Help needed moving to and from a bed to a chair (including a wheelchair)?: A Little Help needed standing up from a chair using your arms (e.g., wheelchair or bedside chair)?: A Little Help needed to walk in hospital room?: A Little Help needed climbing 3-5 steps with a railing? : A Little 6 Click Score: 18    End of Session   Activity Tolerance: Patient tolerated treatment well Patient left: Other (comment);with call bell/phone within reach(on the toilet) Nurse Communication: Mobility status PT Visit Diagnosis: Other abnormalities of gait and mobility (R26.89);Pain Pain - part of body: (general back pain)     Time: 6295-2841 PT Time Calculation (min) (ACUTE ONLY): 9  min  Charges:  $Therapeutic Activity: 8-22 mins                     09/14/2019  Jacinto Halim., PT Acute Rehabilitation Services [phone]  (pager) [phone]  (office)   Eliseo Gum Melissa Pulido 09/14/2019, 6:05 PM

## 2019-09-14 NOTE — TOC Transition Note (Addendum)
Transition of Care [facility]) - CM/SW Discharge Note   Patient Details  Name: Keith Weber MRN: [id number] Date of Birth: 11-26-1970  [facility] CM/SW Contact:  Glennon Mac, RN Phone Number: 09/14/2019, 2:53 PM   Clinical Narrative: Pt medically stable for discharge home today.  PT/OT recommending no OP follow up, DME for home use.  Referral to [employer] for RW and 3 in 1, to be delivered to bedside prior to discharge.         Barriers to Discharge:  Barriers Resolved               Discharge Plan:  Home/Self Care      Discharge Plan and Services   Discharge Planning Services: CM Consult, Medication Assistance, [facility], [facility] Program, Follow-up appt scheduled            DME Arranged: 3-N-1, Walker rolling DME Agency: [employer] Date DME Agency Contacted: 09/14/19 Time DME Agency Contacted: 1452 Representative spoke with at DME Agency: Oletha Cruel            Social Determinants of Health (SDOH) Interventions     Readmission Risk Interventions Readmission Risk Prevention Plan 09/14/2019 09/13/2019  Transportation Screening Complete Complete  PCP or Specialist Appt within 5-7 Days Complete Complete  Home Care Screening Complete Complete  Medication Review (RN CM) Complete Complete  Some recent data might be hidden   Quintella Baton, RN, BSN  Trauma/Neuro ICU Case Manager [phone]

## 2019-09-15 ENCOUNTER — Telehealth: Payer: Self-pay

## 2019-09-15 NOTE — Telephone Encounter (Signed)
Called patient at [phone]. Unable to reach . Left Voice message.Call back number provided.

## 2019-09-18 ENCOUNTER — Telehealth: Payer: Self-pay

## 2019-09-18 NOTE — Telephone Encounter (Signed)
Transition Care Management Follow-up Telephone Call  Date of discharge and from where: 09/14/2019, [facility]   How have you been since you were released from the hospital? He said he is doing better but still hurting,   Any questions or concerns? He is concerned about his pain.  He said he is taking the medications as ordered, including the oxycodone and robaxin.  He said that they numb the pain from the rib fractures but he has noticed an increase in the pain since he has been home and has increased his mobility.  He has been denied disability and is frustrated with the attorney he has been working with.  He plans to go to the [facility] for assistance.  Instructed him to contact this CM if additional help is needed and a referral can be placed to [employer] [place].   He also need to contact DSS to check on the status of medicaid application.   Duoneb is listed on his AVS but he does not have a nebulizer,  Items Reviewed:  Did the pt receive and understand the discharge instructions provided? Yes, he has the instructions and did not have any questions.   Medications obtained and verified? He said that he has all of his medications and did not have any questions, he noted that he is taking them as ordered. He noted that he understands that he has some that he is supposed to stop taking.  .   Any new allergies since your discharge? None reported   Do you have support at home? Currently staying at a friend's home but is not sure how long he will be there.  He said he has been approved for section 8 but needs to call the Housing Authority to check on his status with waiting for an apartment.   Other (ie: DME, Home Health, etc) no home home recommended.   Functional Questionnaire: (I = Independent and D = Dependent) ADL's: independent, pain with movement. Has RW and 3:1 commode    Follow up appointments reviewed:    PCP Hospital f/u appt confirmed? Has appointment with Ms Meredeth Ide, NP  09/20/2019 @ 1050 - prefers televisit.as transportation is limited.   Specialist Hospital f/u appt confirmed? radiolgy appt - 09/27/2019 and trauma 09/28/2019. Needs to schedule appt with orthopedics   Are transportation arrangements needed? He relies on friends to drive him but this is limited.  He also uses the bus.  Informed him to contact DSS to inquire about transportation for individuals without medicaid.   If their condition worsens, is the pt aware to call  their PCP or go to the ED? yes  Was the patient provided with contact information for the PCP's office or ED?he has the phone number for the clinic  Was the pt encouraged to call back with questions or concerns?yes

## 2019-09-20 ENCOUNTER — Encounter: Payer: Self-pay | Admitting: Nurse Practitioner

## 2019-09-20 ENCOUNTER — Telehealth: Payer: Self-pay

## 2019-09-20 ENCOUNTER — Ambulatory Visit: Payer: Self-pay | Attending: Nurse Practitioner | Admitting: Nurse Practitioner

## 2019-09-20 ENCOUNTER — Other Ambulatory Visit: Payer: Self-pay

## 2019-09-20 DIAGNOSIS — J939 Pneumothorax, unspecified: Secondary | ICD-10-CM

## 2019-09-20 DIAGNOSIS — J45909 Unspecified asthma, uncomplicated: Secondary | ICD-10-CM

## 2019-09-20 DIAGNOSIS — J441 Chronic obstructive pulmonary disease with (acute) exacerbation: Secondary | ICD-10-CM

## 2019-09-20 DIAGNOSIS — Z09 Encounter for follow-up examination after completed treatment for conditions other than malignant neoplasm: Secondary | ICD-10-CM

## 2019-09-20 MED ORDER — BREO ELLIPTA 100-25 MCG/INH IN AEPB: 1.0000 | INHALATION_SPRAY | Freq: Every morning | RESPIRATORY_TRACT | 6 refills | Status: AC

## 2019-09-20 MED ORDER — IPRATROPIUM-ALBUTEROL 0.5-2.5 (3) MG/3ML IN SOLN: 3.0000 mL | Freq: Four times a day (QID) | RESPIRATORY_TRACT | 11 refills | Status: DC | PRN

## 2019-09-20 MED FILL — !BREO ELLIPTA 100-25 MCG IN: 100-25 | 30 days supply | Qty: 60 | Fill #0

## 2019-09-20 MED FILL — IPRAT-ALBUT 0.5-3(2.5) MG/3: 0.5-2.5 (3) | 30 days supply | Qty: 360 | Fill #0

## 2019-09-20 NOTE — Progress Notes (Signed)
Virtual Visit via Telephone Note Due to national recommendations of social distancing due to COVID 19, telehealth visit is felt to be most appropriate for this patient at this time.  I discussed the limitations, risks, security and privacy concerns of performing an evaluation and management service by telephone and the availability of in person appointments. I also discussed with the patient that there may be a patient responsible charge related to this service. The patient expressed understanding and agreed to proceed.    I connected with Keith Weber on 10/01/19  at  10:50 AM EST  EDT by telephone and verified that I am speaking with the correct person using two identifiers.   Consent I discussed the limitations, risks, security and privacy concerns of performing an evaluation and management service by telephone and the availability of in person appointments. I also discussed with the patient that there may be a patient responsible charge related to this service. The patient expressed understanding and agreed to proceed.   Location of Patient: Private Residence   Location of Provider: [facility]    Persons participating in Telemedicine visit: Bertram Denver FNP-BC YY LeChee CMA Keith Weber    History of Present Illness: Telemedicine visit [id number]  F/U  has a past medical history of Asthma, COPD (chronic obstructive pulmonary disease) (HCC), GERD (gastroesophageal reflux disease), and Sciatic nerve pain.   Doing okay. States he has Quit smoking. Applied for Medicaid and waiting to hear back.   HFU 1-30 through 2-4 Admitted after falling from a ladder 10 feet while cutting tree limbs and sustaining the following injuries: multiple displaced fractures involving the right lateral 2nd through 11th ribs, with PTX and nasal abrasion. He required chest tube placement and pain medications.  Today he states he is doing better but still with chest pain  when deep breathing. He does have oxycodone for use. Using a walker when walking outside. Inside the home he does not use it.    Asthma Requesting a nebulizer machine. Symptoms are currently controlled. Other medications include: breo and albuterol   Past Medical History:  Diagnosis Date  . Asthma   . COPD (chronic obstructive pulmonary disease) (HCC)   . GERD (gastroesophageal reflux disease)   . Sciatic nerve pain     Past Surgical History:  Procedure Laterality Date  . I & D EXTREMITY Right 06/16/2014   Procedure: IRRIGATION AND DEBRIDEMENT RIGHT THUMB AND FIRST FINGER AND PINNING ;  Surgeon: Dominica Severin, MD;  Location: [facility];  Service: Orthopedics;  Laterality: Right;  . IRRIGATION AND DEBRIDEMENT KNEE Right 06/16/2014   Procedure: IRRIGATION AND DEBRIDEMENT POSTERIOR KNEE;  Surgeon: Dominica Severin, MD;  Location: [facility];  Service: Orthopedics;  Laterality: Right;  . LACERATION REPAIR Right 06/16/2014   Procedure: REPAIR ODF THREE LACERATIONS POSTERIOR KNEE;  Surgeon: Dominica Severin, MD;  Location: [facility];  Service: Orthopedics;  Laterality: Right;    Family History  Family history unknown: Yes    Social History   Socioeconomic History  . Marital status: Single    Spouse name: Not on file  . Number of children: Not on file  . Years of education: Not on file  . Highest education level: Not on file  Occupational History  . Not on file  Tobacco Use  . Smoking status: Former Smoker    Packs/day: 0.00    Years: 31.00    Pack years: 0.00    Types: Cigars  . Smokeless tobacco: Never Used  Substance and  Sexual Activity  . Alcohol use: Yes    Alcohol/week: 1.0 standard drinks    Types: 1 Cans of beer per week    Comment: occ  . Drug use: Yes    Types: Marijuana  . Sexual activity: Not on file  Other Topics Concern  . Not on file  Social History Narrative  . Not on file   Social Determinants of Health   Financial Resource Strain:   . Difficulty of Paying Living  Expenses: Not on file  Food Insecurity:   . Worried About Charity fundraiser in the Last Year: Not on file  . Ran Out of Food in the Last Year: Not on file  Transportation Needs:   . Lack of Transportation (Medical): Not on file  . Lack of Transportation (Non-Medical): Not on file  Physical Activity:   . Days of Exercise per Week: Not on file  . Minutes of Exercise per Session: Not on file  Stress:   . Feeling of Stress : Not on file  Social Connections:   . Frequency of Communication with Friends and Family: Not on file  . Frequency of Social Gatherings with Friends and Family: Not on file  . Attends Religious Services: Not on file  . Active Member of Clubs or Organizations: Not on file  . Attends Archivist Meetings: Not on file  . Marital Status: Not on file     Observations/Objective: Awake, alert and oriented x 3   Review of Systems  Constitutional: Negative for fever, malaise/fatigue and weight loss.  HENT: Negative.  Negative for nosebleeds.   Eyes: Negative.  Negative for blurred vision, double vision and photophobia.  Respiratory: Negative.  Negative for cough, shortness of breath and wheezing.   Cardiovascular: Positive for chest pain. Negative for palpitations and leg swelling.       SEE HPI  Gastrointestinal: Negative.  Negative for heartburn, nausea and vomiting.  Musculoskeletal: Positive for joint pain. Negative for myalgias.  Neurological: Negative.  Negative for dizziness, focal weakness, seizures and headaches.  Psychiatric/Behavioral: Negative.  Negative for suicidal ideas.    Assessment and Plan: Maxx was seen today for hospitalization follow-up.  Diagnoses and all orders for this visit:  Hospital discharge follow-up Follow up with [facility] on [id number]  Asthma, unspecified asthma severity, unspecified whether complicated, unspecified whether persistent -     fluticasone furoate-vilanterol (BREO ELLIPTA) 100-25 MCG/INH AEPB; Inhale 1 puff  into the lungs every morning.  COPD exacerbation ([place]) -     ipratropium-albuterol (DUONEB) 0.5-2.5 (3) MG/3ML SOLN; Take 3 mLs by nebulization every 6 (six) hours as needed.     Follow Up Instructions Return in about 3 months (around 12/18/2019).     I discussed the assessment and treatment plan with the patient. The patient was provided an opportunity to ask questions and all were answered. The patient agreed with the plan and demonstrated an understanding of the instructions.   The patient was advised to call back or seek an in-person evaluation if the symptoms worsen or if the condition fails to improve as anticipated.  I provided 19 minutes of non-face-to-face time during this encounter including median intraservice time, reviewing previous notes, labs, imaging, medications and explaining diagnosis and management.  Keith Pounds, FNP-BC

## 2019-09-20 NOTE — Telephone Encounter (Signed)
Met with the patient when he was in the clinic today.  Provided him with a nebulizer and explained the use.  He said he is familiar with this machine and knows how to use it.  He didn't have any questions and signed the waiver for [employer].  He noted that he doesn't have any additional information about medicaid and disability and is interested in working with the [employer]. Informed him that a referral has been placed but he did not have a phone at the time the referral was placed. He now has a working phone # [phone]. This information was sent via [place] email to [facility].

## 2019-09-21 ENCOUNTER — Telehealth: Payer: Self-pay

## 2019-09-21 NOTE — Telephone Encounter (Signed)
Signed document for charity nebulizer faxed to [employer]

## 2019-10-01 ENCOUNTER — Encounter: Payer: Self-pay | Admitting: Nurse Practitioner

## 2019-10-03 ENCOUNTER — Telehealth: Payer: Self-pay | Admitting: Nurse Practitioner

## 2019-10-03 ENCOUNTER — Telehealth: Payer: Self-pay

## 2019-10-03 NOTE — Telephone Encounter (Signed)
Call received from patient.  He said that he met with the representative from the [facility] and she is helping him collect the necessary information for his disability application.

## 2019-10-03 NOTE — Telephone Encounter (Signed)
Pt called requesting a pain medication be sent to his pharmacy for his rib pain. Pt was given oxyCODONE at the ED but is now out and in pain. Please advise   [facility] - [place], [place] - [address]

## 2019-10-05 ENCOUNTER — Other Ambulatory Visit: Payer: Self-pay | Admitting: Nurse Practitioner

## 2019-10-05 MED ORDER — ACETAMINOPHEN-CODEINE #3 300-30 MG PO TABS: 1.0000 | ORAL_TABLET | Freq: Three times a day (TID) | ORAL | 0 refills | Status: AC | PRN

## 2019-10-05 MED FILL — oxyCODONE HCL 5 MG TABS: 5 | 5 days supply | Qty: 20 | Fill #0

## 2019-10-05 MED FILL — METHOCARBAMOL 500 MG TABS: 500 | 15 days supply | Qty: 30 | Fill #0

## 2019-10-05 MED FILL — ACETAMINOPHEN/COD #3 TABLET: 300-30 | 30 days supply | Qty: 30 | Fill #0

## 2019-10-05 NOTE — Telephone Encounter (Signed)
Tylenol #3 has been sent. Only 30 tablets for 30 days.

## 2019-10-06 NOTE — Telephone Encounter (Signed)
Spoke to patient and informed on medication.  Pt. Understood and is aware.

## 2019-11-09 ENCOUNTER — Ambulatory Visit: Payer: Self-pay | Attending: Internal Medicine

## 2019-11-09 ENCOUNTER — Ambulatory Visit: Payer: Self-pay

## 2019-11-09 DIAGNOSIS — Z23 Encounter for immunization: Secondary | ICD-10-CM

## 2019-11-09 NOTE — Progress Notes (Signed)
   Covid-19 Vaccination Clinic  Name:  Keith Weber    MRN: [id number] DOB: [birth year]  11/09/2019  [name] was observed post Covid-19 immunization for 30 minutes based on pre-vaccination screening without incident. He was provided with Vaccine Information Sheet and instruction to access the V-Safe system.   [name] was instructed to call 911 with any severe reactions post vaccine: Marland Kitchen Difficulty breathing  . Swelling of face and throat  . A fast heartbeat  . A bad rash all over body  . Dizziness and weakness   Immunizations Administered    Name Date Dose VIS Date Route   Pfizer COVID-19 Vaccine 11/09/2019 12:02 PM 0.3 mL 07/21/2019 Intramuscular   Manufacturer: [employer], [employer]   Lot: [id number]   NDC: [address]-4

## 2019-11-10 ENCOUNTER — Ambulatory Visit: Payer: Self-pay

## 2019-12-04 ENCOUNTER — Ambulatory Visit: Payer: Self-pay | Attending: Internal Medicine

## 2019-12-04 DIAGNOSIS — Z23 Encounter for immunization: Secondary | ICD-10-CM

## 2019-12-04 NOTE — Progress Notes (Signed)
   Covid-19 Vaccination Clinic  Name:  Ebubechukwu Jedlicka    MRN: [id number] DOB: 14-Mar-1971  12/04/2019  Mr. Mancia was observed post Covid-19 immunization for 15 minutes without incident. He was provided with Vaccine Information Sheet and instruction to access the V-Safe system.   Mr. Maese was instructed to call 911 with any severe reactions post vaccine: Marland Kitchen Difficulty breathing  . Swelling of face and throat  . A fast heartbeat  . A bad rash all over body  . Dizziness and weakness   Immunizations Administered    Name Date Dose VIS Date Route   Pfizer COVID-19 Vaccine 12/04/2019  8:24 AM 0.3 mL 10/04/2018 Intramuscular   Manufacturer: [employer], [employer]   Lot: [id number]   NDC: [address]-2

## 2019-12-15 MED FILL — $BREO ELLIPTA 100-25 MCG IH: 100-25 MCG | 30 days supply | Qty: 60 | Fill #1

## 2019-12-15 MED FILL — IPRAT-ALBUT 0.5-3(2.5) MG/3: 0.5-2.5 (3) | 30 days supply | Qty: 360 | Fill #1

## 2020-01-11 ENCOUNTER — Ambulatory Visit: Payer: 59 | Attending: Nurse Practitioner | Admitting: Physician Assistant

## 2020-01-11 ENCOUNTER — Other Ambulatory Visit: Payer: Self-pay

## 2020-01-11 DIAGNOSIS — M62838 Other muscle spasm: Secondary | ICD-10-CM

## 2020-01-11 DIAGNOSIS — K089 Disorder of teeth and supporting structures, unspecified: Secondary | ICD-10-CM | POA: Diagnosis not present

## 2020-01-11 DIAGNOSIS — J441 Chronic obstructive pulmonary disease with (acute) exacerbation: Secondary | ICD-10-CM | POA: Diagnosis not present

## 2020-01-11 DIAGNOSIS — K219 Gastro-esophageal reflux disease without esophagitis: Secondary | ICD-10-CM

## 2020-01-11 DIAGNOSIS — M79643 Pain in unspecified hand: Secondary | ICD-10-CM | POA: Diagnosis not present

## 2020-01-11 MED ORDER — METHOCARBAMOL 500 MG PO TABS: 1000.0000 mg | ORAL_TABLET | Freq: Three times a day (TID) | ORAL | 0 refills | Status: DC | PRN

## 2020-01-11 MED ORDER — ALBUTEROL SULFATE HFA 108 (90 BASE) MCG/ACT IN AERS: 1.0000 | INHALATION_SPRAY | Freq: Four times a day (QID) | RESPIRATORY_TRACT | 1 refills | Status: DC | PRN

## 2020-01-11 MED ORDER — OMEPRAZOLE 20 MG PO CPDR: 20.0000 mg | DELAYED_RELEASE_CAPSULE | Freq: Every day | ORAL | 3 refills | Status: DC

## 2020-01-11 MED FILL — OMEPRAZOLE 20 MG CAP: 20 | 30 days supply | Qty: 30 | Fill #0

## 2020-01-11 MED FILL — METHOCARBAMOL 500 MG TABS: 500 | 5 days supply | Qty: 30 | Fill #0

## 2020-01-11 MED FILL — ALBUTEROL SULFATE HFA 108 (: 108 (90 BAS | 25 days supply | Qty: 18 | Fill #0

## 2020-01-11 NOTE — Progress Notes (Signed)
Patient ID: Keith Weber, male   DOB: [birth year], 49 y.o.   MRN: [id number] Virtual Visit via Telephone Note  I connected with Keith Weber on 01/11/20 at  8:30 AM EDT by telephone and verified that I am speaking with the correct person using two identifiers.   I discussed the limitations, risks, security and privacy concerns of performing an evaluation and management service by telephone and the availability of in person appointments. I also discussed with the patient that there may be a patient responsible charge related to this service. The patient expressed understanding and agreed to proceed.  PATIENT visit by telephone virtually in the context of Covid-19 pandemic. Patient location:  home My Location:  [facility] office Persons on the call: me and the patient  History of Present Illness:  Patient needs refill on albuterol inhaler.  He just got insurance through [employer] care and wants to get a dental referral and an orthopedics referral.  He says he broke his hand a while ago and doesn't feel it has healed correctly. Also needs meds for acid reflux.  He thinks he took protonix or prilosec in the past.  Wants rf on muscle relaxers for prn use.  No CP/cardiac issues.     Observations/Objective:  NAD   Assessment and Plan:   1. COPD exacerbation (HCC) - albuterol (VENTOLIN HFA) 108 (90 Base) MCG/ACT inhaler; Inhale 1-2 puffs into the lungs every 6 (six) hours as needed for wheezing or shortness of breath.  Dispense: 18 g; Refill: 1  2. Poor dentition - Ambulatory referral to Dentistry  3. Pain of hand, unspecified laterality - Ambulatory referral to Orthopedic Surgery  4. Muscle spasm - methocarbamol (ROBAXIN) 500 MG tablet; Take 2 tablets (1,000 mg total) by mouth every 8 (eight) hours as needed for muscle spasms.  Dispense: 30 tablet; Refill: 0  5. Gastroesophageal reflux disease without esophagitis - omeprazole (PRILOSEC) 20 MG capsule; Take 1 capsule (20 mg total) by mouth daily.   Dispense: 30 capsule; Refill: 3  Follow Up Instructions: See PCP 4-6 months.  Sooner if needed Reviewed labs from 09/2019   I discussed the assessment and treatment plan with the patient. The patient was provided an opportunity to ask questions and all were answered. The patient agreed with the plan and demonstrated an understanding of the instructions.   The patient was advised to call back or seek an in-person evaluation if the symptoms worsen or if the condition fails to improve as anticipated.  I provided 11 minutes of non-face-to-face time during this encounter.   Georgian Co, PA-C

## 2020-01-12 ENCOUNTER — Telehealth: Payer: Self-pay

## 2020-01-12 NOTE — Telephone Encounter (Signed)
Call returned to patient. He stated that he received a letter from [employer] and is not sure what he needs to do to follow up. He spoke to [facility] who told him to check with PCP.  This CM instructed the patient to bring the letter to this clinic for CM to review and he said he could come 01/15/2020

## 2020-01-15 ENCOUNTER — Other Ambulatory Visit: Payer: Self-pay | Admitting: Nurse Practitioner

## 2020-01-15 ENCOUNTER — Telehealth: Payer: Self-pay

## 2020-01-15 MED ORDER — ACETAMINOPHEN-CODEINE #3 300-30 MG PO TABS: 1.0000 | ORAL_TABLET | Freq: Every day | ORAL | 0 refills | Status: AC | PRN

## 2020-01-15 NOTE — Telephone Encounter (Signed)
Met with the patient when he was in the clinic today.  This CM reviewed the letter from [place].  The patient needs to complete the requested information and return to [employer]. It was due 01/11/2020 but needs to send it back anyway.   SECURE message sent to [facility] regarding the late submission of this documentation.  The patient now has insurance and interested in dental care.  He explained that he has a tooth emerging in his upper palate and it has been very painful.   A dental referral was placed by Ms Thereasa Solo, [place] on 01/11/2020. He is requesting pain medication until he can see a dental provider. Explained to him that his PCP would be notified of this request.    Informed him that this CM will  print a list of dental providers that are in network with his insurance.  He requested that the information be emailed to him :  [email or web address]

## 2020-01-17 NOTE — Telephone Encounter (Signed)
Call placed to patient.  Explained to him that this CM contacted [facility] and she said that her co-worker, Laurin Coder / [employer] spoke to him last week about the form that he needed to complete.  it is regarding his medicaid claim and he should complete it and send it in as soon as possible.  He said that he started to work on it.    Informed him that a prescription for tylenol # 3 was sent to [facility],   Also explained to him that this CM checked his insurance and Cheryll Dessert [facility] referral specialist checked his insurance and he does not have dental coverage.  Informed him that Arna Medici mailed him a list of dental providers. He said that he does not get his mail regularly.  It is not delivered to his residence. He would like it emailed to him.   Message sent to Cheryll Dessert requesting that the dentist list be emailed to the patient.

## 2020-01-18 ENCOUNTER — Other Ambulatory Visit: Payer: Self-pay

## 2020-01-18 ENCOUNTER — Encounter: Payer: Self-pay | Admitting: Orthopaedic Surgery

## 2020-01-18 ENCOUNTER — Telehealth: Payer: Self-pay

## 2020-01-18 ENCOUNTER — Ambulatory Visit: Payer: Self-pay

## 2020-01-18 ENCOUNTER — Ambulatory Visit (INDEPENDENT_AMBULATORY_CARE_PROVIDER_SITE_OTHER): Payer: 59 | Admitting: Orthopaedic Surgery

## 2020-01-18 DIAGNOSIS — M79641 Pain in right hand: Secondary | ICD-10-CM | POA: Diagnosis not present

## 2020-01-18 NOTE — Telephone Encounter (Signed)
Call placed to patient and informed him that the list of dental providers was emailed to him at the email address that we have on file.

## 2020-01-18 NOTE — Progress Notes (Signed)
Office Visit Note   Patient: Keith Weber           Date of Birth: 08/14/1970           MRN: [id number] Visit Date: 01/18/2020              Requested by: Anders Simmonds, PA-C [address] [place],  [place] [address] PCP: Claiborne Rigg, NP   Assessment & Plan: Visit Diagnoses:  1. 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  We will make a referral to the hand center for surgical consultation  Follow-Up Instructions: Return if symptoms worsen or fail to improve.   Orders:  Orders Placed This Encounter  Procedures  . XR Hand Complete Right   No orders of the defined types were placed in this encounter.     Procedures: No procedures performed   Clinical Data: No additional findings.   Subjective: Chief Complaint  Patient presents with  . Right Hand - Pain    Keith is a 49 year old gentleman comes in for evaluation of chronic right hand deformity.  He states that he has had his right hand broken multiple times and originally elected to treat this nonoperatively.  He has now changed his mind and wants surgery.  He mainly is complaining of the cosmetic appearance of the right hand.   Review of Systems  Constitutional: Negative.   All other systems reviewed and are negative.    Objective: Vital Signs: There were no vitals taken for this visit.  Physical Exam Vitals and nursing note reviewed.  Constitutional:      Appearance: He is well-developed.  HENT:     Head: Normocephalic and atraumatic.  Eyes:     Pupils: Pupils are equal, round, and reactive to light.  Pulmonary:     Effort: Pulmonary effort is normal.  Abdominal:     Palpations: Abdomen is soft.  Musculoskeletal:        General: Normal range of motion.     Cervical back: Neck supple.  Skin:    General: Skin is warm.  Neurological:     Mental Status: He is alert and oriented to person, place, and time.  Psychiatric:         Behavior: Behavior normal.        Thought Content: Thought content normal.        Judgment: Judgment normal.     Ortho Exam Right hand shows dorsal bossing of the fourth and fifth metacarpals due to malunions.  No neurovascular compromise. Specialty Comments:  No specialty comments available.  Imaging: XR Hand Complete Right  Result Date: 01/18/2020  malunion of fourth and fifth metacarpals with volar angulation    PMFS History: Patient Active Problem List   Diagnosis Date Noted  . Pneumothorax on right 09/09/2019  . CAP (community acquired pneumonia) 01/21/2016  . Right middle lobe pneumonia 01/21/2016  . Glucosuria with normal serum glucose 01/21/2016  . Lactic acidosis 01/21/2016  . CKD (chronic kidney disease) stage 3, GFR 30-59 ml/min 01/21/2016  . Cigarette smoker 06/12/2015  . COPD pfts pending/ still smoking  06/11/2015  . Asthma, chronic 10/16/2014  . Closed fracture of right thumb 06/16/2014   Past Medical History:  Diagnosis Date  . Asthma   . COPD (chronic obstructive pulmonary disease) (HCC)   . GERD (gastroesophageal reflux disease)   . Sciatic nerve pain     Family History  Family history unknown: Yes    Past Surgical History:  Procedure Laterality Date  . I & D EXTREMITY Right 06/16/2014   Procedure: IRRIGATION AND DEBRIDEMENT RIGHT THUMB AND FIRST FINGER AND PINNING ;  Surgeon: Roseanne Kaufman, MD;  Location: [facility];  Service: Orthopedics;  Laterality: Right;  . IRRIGATION AND DEBRIDEMENT KNEE Right 06/16/2014   Procedure: IRRIGATION AND DEBRIDEMENT POSTERIOR KNEE;  Surgeon: Roseanne Kaufman, MD;  Location: [facility] [facility];  Service: Orthopedics;  Laterality: Right;  . LACERATION REPAIR Right 06/16/2014   Procedure: REPAIR ODF THREE LACERATIONS POSTERIOR KNEE;  Surgeon: Roseanne Kaufman, MD;  Location: [facility];  Service: Orthopedics;  Laterality: Right;   Social History   Occupational History  . Not on file  Tobacco Use  . Smoking status: Former Smoker     Packs/day: 0.00    Years: 31.00    Pack years: 0.00    Types: Cigars  . Smokeless tobacco: Never Used  Substance and Sexual Activity  . Alcohol use: Yes    Alcohol/week: 1.0 standard drink    Types: 1 Cans of beer per week    Comment: occ  . Drug use: Yes    Types: Marijuana  . Sexual activity: Not on file

## 2020-01-29 ENCOUNTER — Other Ambulatory Visit: Payer: Self-pay | Admitting: Radiology

## 2020-01-29 DIAGNOSIS — M79641 Pain in right hand: Secondary | ICD-10-CM

## 2020-02-19 MED FILL — OMEPRAZOLE 20 MG CAP: 20 | 90 days supply | Qty: 90 | Fill #1

## 2020-02-19 MED FILL — ALBUTEROL SULFATE HFA 108 (: 108 (90 BAS | 25 days supply | Qty: 18 | Fill #1

## 2020-02-26 ENCOUNTER — Encounter (HOSPITAL_COMMUNITY): Payer: Self-pay

## 2020-02-26 ENCOUNTER — Other Ambulatory Visit: Payer: Self-pay

## 2020-02-26 ENCOUNTER — Emergency Department (HOSPITAL_COMMUNITY)
Admission: EM | Admit: 2020-02-26 | Discharge: 2020-02-26 | Disposition: A | Discharge status: Home / Self Care | Payer: 59 | Attending: Emergency Medicine | Admitting: Emergency Medicine

## 2020-02-26 DIAGNOSIS — N183 Chronic kidney disease, stage 3 unspecified: Secondary | ICD-10-CM | POA: Diagnosis not present

## 2020-02-26 DIAGNOSIS — Z91040 Latex allergy status: Secondary | ICD-10-CM | POA: Diagnosis not present

## 2020-02-26 DIAGNOSIS — J45909 Unspecified asthma, uncomplicated: Secondary | ICD-10-CM | POA: Insufficient documentation

## 2020-02-26 DIAGNOSIS — J449 Chronic obstructive pulmonary disease, unspecified: Secondary | ICD-10-CM | POA: Diagnosis not present

## 2020-02-26 DIAGNOSIS — Z87891 Personal history of nicotine dependence: Secondary | ICD-10-CM | POA: Diagnosis not present

## 2020-02-26 DIAGNOSIS — Z79899 Other long term (current) drug therapy: Secondary | ICD-10-CM | POA: Diagnosis not present

## 2020-02-26 DIAGNOSIS — K0889 Other specified disorders of teeth and supporting structures: Secondary | ICD-10-CM | POA: Diagnosis not present

## 2020-02-26 MED ORDER — TRAMADOL HCL 50 MG PO TABS
50.0000 mg | ORAL_TABLET | Freq: Once | ORAL | Status: DC
  Filled 2020-02-26: qty 1

## 2020-02-26 MED ORDER — DOXYCYCLINE HYCLATE 100 MG PO CAPS: 100.0000 mg | ORAL_CAPSULE | Freq: Two times a day (BID) | ORAL | 0 refills | Status: DC

## 2020-02-26 MED ORDER — HYDROCODONE-ACETAMINOPHEN 5-325 MG PO TABS
1.0000 | ORAL_TABLET | Freq: Once | ORAL | Status: AC
  Administered 2020-02-26: 1 via ORAL
  Filled 2020-02-26: qty 1

## 2020-02-26 NOTE — ED Provider Notes (Signed)
[facility] EMERGENCY DEPARTMENT Provider Note   CSN: [id number] Arrival date & time: 02/26/20  8185     History Chief Complaint  Patient presents with  . Dental Pain    Keith Weber is a 49 y.o. male w hx of COPD, asthma, GERD who presents w acute on chronic mouth pain. Pt states pain has been worsening over the past few days. Patient states he has been seen here previously for same complaint.  States he has "tooth growing in the wrong place".  Pt states anytime anything touches the roof of his mouth food, water, his tongue creates severe pain. Patient denies fever, purulent drainage.  Patient states he was supposed to follow-up with outside dentist however he missed this appointment.   The history is provided by the patient and medical records.       Past Medical History:  Diagnosis Date  . Asthma   . COPD (chronic obstructive pulmonary disease) (HCC)   . GERD (gastroesophageal reflux disease)   . Sciatic nerve pain     Patient Active Problem List   Diagnosis Date Noted  . Pneumothorax on right 09/09/2019  . CAP (community acquired pneumonia) 01/21/2016  . Right middle lobe pneumonia 01/21/2016  . Glucosuria with normal serum glucose 01/21/2016  . Lactic acidosis 01/21/2016  . CKD (chronic kidney disease) stage 3, GFR 30-59 ml/min 01/21/2016  . Cigarette smoker 06/12/2015  . COPD pfts pending/ still smoking  06/11/2015  . Asthma, chronic 10/16/2014  . Closed fracture of right thumb 06/16/2014    Past Surgical History:  Procedure Laterality Date  . I & D EXTREMITY Right 06/16/2014   Procedure: IRRIGATION AND DEBRIDEMENT RIGHT THUMB AND FIRST FINGER AND PINNING ;  Surgeon: Dominica Severin, MD;  Location: [facility] OR;  Service: Orthopedics;  Laterality: Right;  . IRRIGATION AND DEBRIDEMENT KNEE Right 06/16/2014   Procedure: IRRIGATION AND DEBRIDEMENT POSTERIOR KNEE;  Surgeon: Dominica Severin, MD;  Location: [facility] OR;  Service: Orthopedics;  Laterality: Right;  .  LACERATION REPAIR Right 06/16/2014   Procedure: REPAIR ODF THREE LACERATIONS POSTERIOR KNEE;  Surgeon: Dominica Severin, MD;  Location: [facility] OR;  Service: Orthopedics;  Laterality: Right;       Family History  Family history unknown: Yes    Social History   Tobacco Use  . Smoking status: Former Smoker    Packs/day: 0.00    Years: 31.00    Pack years: 0.00    Types: Cigars  . Smokeless tobacco: Never Used  Substance Use Topics  . Alcohol use: Yes    Alcohol/week: 1.0 standard drink    Types: 1 Cans of beer per week    Comment: occ  . Drug use: Yes    Types: Marijuana    Home Medications Prior to Admission medications   Medication Sig Start Date End Date Taking? Authorizing Provider  acetaminophen (TYLENOL) 500 MG tablet Take 2 tablets (1,000 mg total) by mouth every 6 (six) hours as needed. 09/14/19   Meuth, Brooke A, PA-C  albuterol (VENTOLIN HFA) 108 (90 Base) MCG/ACT inhaler Inhale 1-2 puffs into the lungs every 6 (six) hours as needed for wheezing or shortness of breath. 01/11/20   Anders Simmonds, PA-C  docusate sodium (COLACE) 100 MG capsule Take 1 capsule (100 mg total) by mouth 2 (two) times daily. 09/14/19   Meuth, Brooke A, PA-C  doxycycline (VIBRAMYCIN) 100 MG capsule Take 1 capsule (100 mg total) by mouth 2 (two) times daily for 7 days. 02/26/20 03/04/20  Izzah Pasqua,  Percell Belt, MD  guaiFENesin (MUCINEX) 600 MG 12 hr tablet Take 1 tablet (600 mg total) by mouth 2 (two) times daily. To loosen phlegm/mucus 09/14/19   Meuth, Nehemiah Settle A, PA-C  ipratropium-albuterol (DUONEB) 0.5-2.5 (3) MG/3ML SOLN Take 3 mLs by nebulization every 6 (six) hours as needed. 09/20/19   Claiborne Rigg, NP  methocarbamol (ROBAXIN) 500 MG tablet Take 2 tablets (1,000 mg total) by mouth every 8 (eight) hours as needed for muscle spasms. 01/11/20   Anders Simmonds, PA-C  Multiple Vitamin (MULTIVITAMIN WITH MINERALS) TABS tablet Take 1 tablet by mouth daily.    [provider]  omeprazole (PRILOSEC) 20 MG  capsule Take 1 capsule (20 mg total) by mouth daily. 01/11/20   Anders Simmonds, PA-C  sodium chloride (OCEAN) 0.65 % SOLN nasal spray Place 1 spray into both nostrils as needed for congestion. 09/14/19   Meuth, Brooke A, PA-C    Allergies    Aspirin, Peanut-containing drug products, Shellfish allergy, Amoxicillin, Iodine, Kiwi extract, Naproxen, Tramadol, and Latex  Review of Systems   Review of Systems  Constitutional: Negative for chills and fever.  HENT: Negative for congestion, rhinorrhea and sore throat.   Eyes: Negative for visual disturbance.  Respiratory: Negative for cough and shortness of breath.   Cardiovascular: Negative for chest pain and leg swelling.  Gastrointestinal: Negative for abdominal pain, nausea and vomiting.  Musculoskeletal: Negative for back pain and gait problem.  Skin: Negative for rash and wound.  Neurological: Positive for headaches. Negative for dizziness.  Psychiatric/Behavioral: Positive for agitation.    Physical Exam Updated Vital Signs BP (!) 144/83   Pulse 81   Temp 98.6 F (37 C) (Oral)   Resp 16   Ht 6' (1.829 m)   Wt 77.1 kg   SpO2 100%   BMI 23.06 kg/m   Physical Exam Vitals and nursing note reviewed.  Constitutional:      General: He is not in acute distress.    Appearance: Normal appearance. He is normal weight. He is not ill-appearing, toxic-appearing or diaphoretic.  HENT:     Head: Normocephalic and atraumatic.     Nose: Nose normal.     Mouth/Throat:     Mouth: Mucous membranes are moist.     Comments: Pt with bony-like protrusion from roof of mouth with soft tissue swelling around it. No obvious area of induration. Some erythema of area. TTP. No purulent drainage noted.  Eyes:     Extraocular Movements: Extraocular movements intact.     Conjunctiva/sclera: Conjunctivae normal.  Cardiovascular:     Rate and Rhythm: Normal rate and regular rhythm.     Heart sounds: Normal heart sounds. No murmur heard.   Pulmonary:      Effort: Pulmonary effort is normal. No respiratory distress.     Breath sounds: Normal breath sounds. No wheezing.  Abdominal:     General: There is no distension.     Palpations: Abdomen is soft.     Tenderness: There is no abdominal tenderness. There is no guarding or rebound.  Musculoskeletal:     Right lower leg: No edema.  Skin:    General: Skin is warm.     Findings: No rash.  Neurological:     General: No focal deficit present.     Mental Status: He is alert. Mental status is at baseline.  Psychiatric:        Behavior: Behavior is agitated.     ED Results / Procedures / Treatments  Labs (all labs ordered are listed, but only abnormal results are displayed) Labs Reviewed - No data to display  EKG None  Radiology No results found.  Procedures Procedures (including critical care time)  Medications Ordered in ED Medications  HYDROcodone-acetaminophen (NORCO/VICODIN) 5-325 MG per tablet 1 tablet (1 tablet Oral Given 02/26/20 1853)    ED Course  I have reviewed the triage vital signs and the nursing notes.  Pertinent labs & imaging results that were available during my care of the patient were reviewed by me and considered in my medical decision making (see chart for details).    MDM Rules/Calculators/A&P                          Pt is a 49 yo M w Hx of COPD/Asthma who presents with dental pain. Pt describes acute on chronic worsening of pain to roof of mouth over the past few days. Pt states this "extra tooth" has been present for many months and he has been seen here previously for same. Pt states he was supposed to see dentist but missed the appointment and has been unable to reschedule. Denies fever, purulent drainage, swelling, trouble swelling. On presentation pt afebrile, HDS, NAD. On inspection of mouth pt with bony protrusion from roof of mouth with soft tissue swelling around that was ttp. No purulent drainage noted. No area of induration that would be  amenable to drainage.  Patient breathing at baseline, handling own secretions, tolerating p.o.  No soft tissue swelling concerning for Ludwig's, deep tissue infection.  Patient overall well-appearing.  Medical record reviewed and significant for CT maxillofacial imaging that was significant for breakdown of the maxilla and fracture with a bony fragment beneath the mucosa of the roof of the mouth.  The CT finding is consistent with my exam today.  This is likely same bony fragment that is now further out than previously.  Based on CT imaging, my exam patient needs to follow-up with dentistry for further management of this bony fragment.  Due to worsening pain, concern for infection will give patient Rx for 7 days doxycycline as patient states he has allergy to amoxicillin/clindamycin.  Patient complaining of severe pain at this time will give 1 dose Norco prior to discharge.  Patient provided resources for area dentistry's in written format in AVS.  Patient discharged in stable condition.  Final Clinical Impression(s) / ED Diagnoses Final diagnoses:  Pain, dental    Rx / DC Orders ED Discharge Orders         Ordered    doxycycline (VIBRAMYCIN) 100 MG capsule  2 times daily     Discontinue  Reprint     02/26/20 1840           Golden Pop, MD 02/26/20 2354    Virgina Norfolk, DO 02/27/20 0102

## 2020-02-26 NOTE — ED Notes (Signed)
Patient Alert and oriented to baseline. Stable and ambulatory to baseline. Patient verbalized understanding of the discharge instructions.  Patient belongings were taken by the patient.   

## 2020-02-26 NOTE — ED Provider Notes (Signed)
I have personally seen and examined the patient. I have reviewed the documentation on PMH/FH/Soc Hx. I have discussed the plan of care with the resident and patient.  I have reviewed and agree with the resident's documentation. Please see associated encounter note.  Briefly, the patient is a 49 y.o. male here with dental pain.  No signs of major infection.  No evidence of Ludwigs. Will d/c with abx, dentist info.  This chart was dictated using voice recognition software.  Despite best efforts to proofread,  errors can occur which can change the documentation meaning.    EKG Interpretation None         Virgina Norfolk, DO 02/26/20 1844

## 2020-02-26 NOTE — ED Triage Notes (Signed)
Pt arrives POV for eval of dental pain x 1 year. States had appt, but missed appointment. Reports he has a tooth "growing where it's not supposed to grow at". Pt reports new swelling in last week

## 2020-03-04 ENCOUNTER — Telehealth: Payer: Self-pay | Admitting: Nurse Practitioner

## 2020-03-04 MED ORDER — DOXYCYCLINE HYCLATE 100 MG PO CAPS: 100.0000 mg | ORAL_CAPSULE | Freq: Two times a day (BID) | ORAL | 0 refills | Status: AC

## 2020-03-04 NOTE — Telephone Encounter (Signed)
Pt did pick up rx for doxycycline and now is requesting the abx to be called into [facility]

## 2020-03-04 NOTE — Telephone Encounter (Signed)
Copied from CRM [id number]. Topic: Appointment Scheduling - Scheduling Inquiry for Clinic >> Mar 04, 2020  8:09 AM Leafy Ro wrote: Reason for CRM: pt is calling and requesting prednisone for poison ivy on his hands ,arms  and legs. Pt noticed poison ivy about 2 days ago. Pt would like to be seen today

## 2020-03-04 NOTE — Telephone Encounter (Signed)
Routing to office  

## 2020-03-04 NOTE — Telephone Encounter (Signed)
Rx sent to [facility] pharmacy.  

## 2020-03-04 NOTE — Addendum Note (Signed)
Addended by: Lois Huxley, Jeannett Senior L on: 03/04/2020 04:22 PM   Modules accepted: Orders

## 2020-03-05 MED FILL — DOXYCYCLINE HYCLATE 100 MG: 100 | 7 days supply | Qty: 14 | Fill #0

## 2020-03-07 ENCOUNTER — Other Ambulatory Visit: Payer: Self-pay | Admitting: Nurse Practitioner

## 2020-03-07 MED ORDER — BETAMETHASONE DIPROPIONATE 0.05 % EX CREA: TOPICAL_CREAM | Freq: Two times a day (BID) | CUTANEOUS | 1 refills | Status: AC

## 2020-03-07 NOTE — Telephone Encounter (Signed)
Topical steroid cream has been sent

## 2020-03-08 MED FILL — BETAMETHASONE DP 0.05% CRM: 0.05 | 30 days supply | Qty: 60 | Fill #0

## 2020-03-08 NOTE — Telephone Encounter (Signed)
Spoke to patient and informed Rx. Patient understood.

## 2020-03-14 ENCOUNTER — Telehealth: Payer: Self-pay | Admitting: Nurse Practitioner

## 2020-03-14 ENCOUNTER — Other Ambulatory Visit: Payer: Self-pay | Admitting: Nurse Practitioner

## 2020-03-14 DIAGNOSIS — S62501P Fracture of unspecified phalanx of right thumb, subsequent encounter for fracture with malunion: Secondary | ICD-10-CM

## 2020-03-14 DIAGNOSIS — J441 Chronic obstructive pulmonary disease with (acute) exacerbation: Secondary | ICD-10-CM

## 2020-03-14 MED FILL — IPRAT-ALBUT 0.5-3(2.5) MG/3: 0.5-2.5 (3) | 30 days supply | Qty: 360 | Fill #1

## 2020-03-14 NOTE — Telephone Encounter (Signed)
Please advise.  Copied from CRM [id number]. Topic: Referral - Request for Referral >> Mar 14, 2020  9:09 AM Wyonia Hough E wrote: Has patient seen PCP for this complaint? Yes  *If NO, is insurance requiring patient see PCP for this issue before PCP can refer them? Referral for which specialty: Ortho  Preferred provider/office:  Reason for referral: Right hand bone in wrong place   Pt went to [facility] and nothing was able to be done so he would like another referral to another office to see what they can do or if they can refer him to someone who can

## 2020-03-14 NOTE — Telephone Encounter (Signed)
Requested Prescriptions  Pending Prescriptions Disp Refills  . ipratropium-albuterol (DUONEB) 0.5-2.5 (3) MG/3ML SOLN [Pharmacy Med Name: IPRAT-ALBUT 0.5-3(2.5) MG/3 0.5-2.5 (3) Solution] 360 mL 11    Sig: TAKE 3 MLS BY NEBULIZATION EVERY 6 (SIX) HOURS AS NEEDED.     Pulmonology:  Combination Products Passed - 03/14/2020  9:13 AM      Passed - Valid encounter within last 12 months    Recent Outpatient Visits          2 months ago Poor dentition   [facility] [place], Marzella Schlein, [place]   5 months ago Hospital discharge follow-up   [facility] [place], [place] W, NP   2 years ago Asthma, unspecified asthma severity, unspecified whether complicated, unspecified whether persistent   [facility] [place], Shea Stakes, NP   4 years ago Pulmonary emphysema, unspecified emphysema type [facility])   [facility] [place] Finland, NP   4 years ago COPD exacerbation   [facility] Ambrose Finland, NP

## 2020-03-18 MED FILL — BETAMETHASONE DP 0.05% CRM: 0.05 | 21 days supply | Qty: 45 | Fill #0

## 2020-06-20 ENCOUNTER — Other Ambulatory Visit: Payer: Self-pay | Admitting: Nurse Practitioner

## 2020-06-20 ENCOUNTER — Other Ambulatory Visit: Payer: Self-pay | Admitting: Physician Assistant

## 2020-06-20 DIAGNOSIS — K219 Gastro-esophageal reflux disease without esophagitis: Secondary | ICD-10-CM

## 2020-06-20 DIAGNOSIS — J441 Chronic obstructive pulmonary disease with (acute) exacerbation: Secondary | ICD-10-CM

## 2020-06-20 MED FILL — ALBUTEROL SULFATE HFA 108 (: 108 (90 BAS | 25 days supply | Qty: 18 | Fill #0

## 2020-06-20 MED FILL — IPRAT-ALBUT 0.5-3(2.5) MG/3: 0.5-2.5 (3) | 30 days supply | Qty: 360 | Fill #2

## 2020-06-20 MED FILL — OMEPRAZOLE 20 MG CAP: 20 | 90 days supply | Qty: 90 | Fill #0

## 2020-06-27 MED FILL — OMEPRAZOLE 20 MG CAP: 20 | 90 days supply | Qty: 90 | Fill #0

## 2020-06-27 MED FILL — IPRAT-ALBUT 0.5-3(2.5) MG/3: 0.5-2.5 (3) | 30 days supply | Qty: 360 | Fill #2

## 2020-06-27 MED FILL — ALBUTEROL SULFATE HFA 108 (: 108 (90 BAS | 25 days supply | Qty: 18 | Fill #0

## 2020-08-09 ENCOUNTER — Emergency Department (HOSPITAL_COMMUNITY): Payer: 59

## 2020-08-09 ENCOUNTER — Emergency Department (HOSPITAL_COMMUNITY)
Admission: EM | Admit: 2020-08-09 | Discharge: 2020-08-10 | Disposition: A | Discharge status: Home / Self Care | Payer: 59 | Attending: Emergency Medicine | Admitting: Emergency Medicine

## 2020-08-09 ENCOUNTER — Encounter (HOSPITAL_COMMUNITY): Payer: Self-pay | Admitting: Emergency Medicine

## 2020-08-09 ENCOUNTER — Other Ambulatory Visit: Payer: Self-pay

## 2020-08-09 DIAGNOSIS — J189 Pneumonia, unspecified organism: Secondary | ICD-10-CM | POA: Diagnosis not present

## 2020-08-09 DIAGNOSIS — J449 Chronic obstructive pulmonary disease, unspecified: Secondary | ICD-10-CM | POA: Diagnosis not present

## 2020-08-09 DIAGNOSIS — Z91010 Allergy to peanuts: Secondary | ICD-10-CM | POA: Insufficient documentation

## 2020-08-09 DIAGNOSIS — Z20822 Contact with and (suspected) exposure to covid-19: Secondary | ICD-10-CM | POA: Insufficient documentation

## 2020-08-09 DIAGNOSIS — Z91040 Latex allergy status: Secondary | ICD-10-CM | POA: Insufficient documentation

## 2020-08-09 DIAGNOSIS — Z87891 Personal history of nicotine dependence: Secondary | ICD-10-CM | POA: Insufficient documentation

## 2020-08-09 DIAGNOSIS — N183 Chronic kidney disease, stage 3 unspecified: Secondary | ICD-10-CM | POA: Insufficient documentation

## 2020-08-09 DIAGNOSIS — J45909 Unspecified asthma, uncomplicated: Secondary | ICD-10-CM | POA: Insufficient documentation

## 2020-08-09 DIAGNOSIS — R059 Cough, unspecified: Secondary | ICD-10-CM | POA: Diagnosis present

## 2020-08-09 LAB — BASIC METABOLIC PANEL
Anion gap: 11 (ref 5–15)
BUN: 12 mg/dL (ref 6–20)
CO2: 24 mmol/L (ref 22–32)
Calcium: 9.3 mg/dL (ref 8.9–10.3)
Chloride: 102 mmol/L (ref 98–111)
Creatinine, Ser: 1.28 mg/dL — ABNORMAL HIGH (ref 0.61–1.24)
GFR, Estimated: >60 mL/min (ref >60)
Glucose, Bld: 91 mg/dL (ref 70–99)
Potassium: 4.2 mmol/L (ref 3.5–5.1)
Sodium: 137 mmol/L (ref 135–145)

## 2020-08-09 LAB — CBC
HCT: 42.2 % (ref 39.0–52.0)
Hemoglobin: 14.9 g/dL (ref 13.0–17.0)
MCH: 34.4 pg — ABNORMAL HIGH (ref 26.0–34.0)
MCHC: 35.3 g/dL (ref 30.0–36.0)
MCV: 97.5 fL (ref 80.0–100.0)
Platelets: 260 10*3/uL (ref 150–400)
RBC: 4.33 MIL/uL (ref 4.22–5.81)
RDW: 13.5 % (ref 11.5–15.5)
WBC: 9.1 10*3/uL (ref 4.0–10.5)
nRBC: 0 % (ref 0.0–0.2)

## 2020-08-09 LAB — POC SARS CORONAVIRUS 2 AG -  ED: SARS Coronavirus 2 Ag: NEGATIVE

## 2020-08-09 MED ORDER — ACETAMINOPHEN 325 MG PO TABS
650.0000 mg | ORAL_TABLET | Freq: Once | ORAL | Status: AC
  Administered 2020-08-09: 650 mg via ORAL
  Filled 2020-08-09: qty 2

## 2020-08-09 MED ORDER — ALBUTEROL SULFATE HFA 108 (90 BASE) MCG/ACT IN AERS
2.0000 | INHALATION_SPRAY | RESPIRATORY_TRACT | Status: DC | PRN
  Administered 2020-08-09: 2 via RESPIRATORY_TRACT
  Filled 2020-08-09 (×2): qty 6.7

## 2020-08-10 ENCOUNTER — Other Ambulatory Visit: Payer: Self-pay | Admitting: Physician Assistant

## 2020-08-10 LAB — RESP PANEL BY RT-PCR (FLU A&B, COVID) ARPGX2
Influenza A by PCR: NEGATIVE
Influenza B by PCR: NEGATIVE
SARS Coronavirus 2 by RT PCR: NEGATIVE

## 2020-08-10 MED ORDER — PREDNISONE 50 MG PO TABS: 50.0000 mg | ORAL_TABLET | Freq: Every day | ORAL | 0 refills | Status: DC

## 2020-08-10 MED ORDER — PREDNISONE 50 MG PO TABS: 50.0000 mg | ORAL_TABLET | Freq: Every day | ORAL | 0 refills | Status: AC

## 2020-08-10 MED ORDER — ALBUTEROL SULFATE HFA 108 (90 BASE) MCG/ACT IN AERS
6.0000 | INHALATION_SPRAY | Freq: Once | RESPIRATORY_TRACT | Status: AC
  Administered 2020-08-10: 6 via RESPIRATORY_TRACT
  Filled 2020-08-10: qty 6.7

## 2020-08-10 MED ORDER — DOXYCYCLINE HYCLATE 100 MG PO CAPS: 100.0000 mg | ORAL_CAPSULE | Freq: Two times a day (BID) | ORAL | 0 refills | Status: DC

## 2020-08-10 MED ORDER — PREDNISONE 20 MG PO TABS
60.0000 mg | ORAL_TABLET | Freq: Once | ORAL | Status: AC
  Administered 2020-08-10: 60 mg via ORAL
  Filled 2020-08-10: qty 3

## 2020-08-10 MED ORDER — ACETAMINOPHEN 325 MG PO TABS
650.0000 mg | ORAL_TABLET | Freq: Once | ORAL | Status: AC
  Administered 2020-08-10: 650 mg via ORAL
  Filled 2020-08-10: qty 2

## 2020-08-10 MED ORDER — DOXYCYCLINE HYCLATE 100 MG PO TABS
100.0000 mg | ORAL_TABLET | Freq: Once | ORAL | Status: AC
  Administered 2020-08-10: 100 mg via ORAL
  Filled 2020-08-10: qty 1

## 2020-08-10 MED ORDER — DOXYCYCLINE HYCLATE 100 MG PO CAPS: 100.0000 mg | ORAL_CAPSULE | Freq: Two times a day (BID) | ORAL | 0 refills | Status: AC

## 2020-08-10 NOTE — ED Provider Notes (Signed)
[facility] EMERGENCY DEPARTMENT Provider Note   CSN: [id number] Arrival date & time: 08/09/20  1859     History Chief Complaint  Patient presents with  . Asthma    Keith Weber is a 50 y.o. male.  HPI   50 year old male with a history of COPD, GERD, sciatic nerve pain, who presents to the emergency department today for evaluation of cough, fevers and shortness of breath.  He has had a cough for the last several days.  It is nonproductive.  He has some chest wall pain with cough as well.  He feels nauseated but denies any vomiting.  He is not sure if he has been around anybody with Covid but states he has had both his Covid vaccines.  Has been using his home nebulizer treatments without significant relief.  Past Medical History:  Diagnosis Date  . Asthma   . COPD (chronic obstructive pulmonary disease) ([place])   . GERD (gastroesophageal reflux disease)   . Sciatic nerve pain     Patient Active Problem List   Diagnosis Date Noted  . Pneumothorax on right 09/09/2019  . CAP (community acquired pneumonia) 01/21/2016  . Right middle lobe pneumonia 01/21/2016  . Glucosuria with normal serum glucose 01/21/2016  . Lactic acidosis 01/21/2016  . CKD (chronic kidney disease) stage 3, GFR 30-59 ml/min (HCC) 01/21/2016  . Cigarette smoker 06/12/2015  . COPD pfts pending/ still smoking  06/11/2015  . Asthma, chronic 10/16/2014  . Closed fracture of right thumb 06/16/2014    Past Surgical History:  Procedure Laterality Date  . I & D EXTREMITY Right 06/16/2014   Procedure: IRRIGATION AND DEBRIDEMENT RIGHT THUMB AND FIRST FINGER AND PINNING ;  Surgeon: Roseanne Kaufman, MD;  Location: [place];  Service: Orthopedics;  Laterality: Right;  . IRRIGATION AND DEBRIDEMENT KNEE Right 06/16/2014   Procedure: IRRIGATION AND DEBRIDEMENT POSTERIOR KNEE;  Surgeon: Roseanne Kaufman, MD;  Location: [place];  Service: Orthopedics;  Laterality: Right;  . LACERATION REPAIR Right 06/16/2014    Procedure: REPAIR ODF THREE LACERATIONS POSTERIOR KNEE;  Surgeon: Roseanne Kaufman, MD;  Location: [place];  Service: Orthopedics;  Laterality: Right;       Family History  Family history unknown: Yes    Social History   Tobacco Use  . Smoking status: Former Smoker    Packs/day: 0.00    Years: 31.00    Pack years: 0.00    Types: Cigars  . Smokeless tobacco: Never Used  Substance Use Topics  . Alcohol use: Yes    Alcohol/week: 1.0 standard drink    Types: 1 Cans of beer per week    Comment: occ  . Drug use: Yes    Types: Marijuana    Home Medications Prior to Admission medications   Medication Sig Start Date End Date Taking? Authorizing Provider  doxycycline (VIBRAMYCIN) 100 MG capsule Take 1 capsule (100 mg total) by mouth 2 (two) times daily for 7 days. 08/10/20 08/17/20 Yes Antonios Ostrow S, PA-C  predniSONE (DELTASONE) 50 MG tablet Take 1 tablet (50 mg total) by mouth daily for 5 days. 08/10/20 08/15/20 Yes Magnolia Mattila S, PA-C  acetaminophen (TYLENOL) 500 MG tablet Take 2 tablets (1,000 mg total) by mouth every 6 (six) hours as needed. 09/14/19   Meuth, Brooke A, PA-C  albuterol (VENTOLIN HFA) 108 (90 Base) MCG/ACT inhaler INHALE 1-2 PUFFS INTO THE LUNGS EVERY 6 (SIX) HOURS AS NEEDED FOR WHEEZING OR SHORTNESS OF BREATH. 06/20/20   Gildardo Pounds, NP  betamethasone  dipropionate 0.05 % cream Apply topically 2 (two) times daily. 03/07/20   Claiborne Rigg, NP  docusate sodium (COLACE) 100 MG capsule Take 1 capsule (100 mg total) by mouth 2 (two) times daily. 09/14/19   Meuth, Lina Sar, PA-C  guaiFENesin (MUCINEX) 600 MG 12 hr tablet Take 1 tablet (600 mg total) by mouth 2 (two) times daily. To loosen phlegm/mucus 09/14/19   Meuth, Nehemiah Settle A, PA-C  ipratropium-albuterol (DUONEB) 0.5-2.5 (3) MG/3ML SOLN TAKE 3 MLS BY NEBULIZATION EVERY 6 (SIX) HOURS AS NEEDED. 03/14/20   Claiborne Rigg, NP  methocarbamol (ROBAXIN) 500 MG tablet Take 2 tablets (1,000 mg total) by mouth every 8 (eight) hours as  needed for muscle spasms. 01/11/20   Anders Simmonds, PA-C  Multiple Vitamin (MULTIVITAMIN WITH MINERALS) TABS tablet Take 1 tablet by mouth daily.    [provider]  omeprazole (PRILOSEC) 20 MG capsule TAKE 1 CAPSULE (20 MG TOTAL) BY MOUTH DAILY. 06/20/20   Claiborne Rigg, NP  sodium chloride (OCEAN) 0.65 % SOLN nasal spray Place 1 spray into both nostrils as needed for congestion. 09/14/19   Meuth, Brooke A, PA-C    Allergies    Aspirin, Peanut-containing drug products, Shellfish allergy, Amoxicillin, Iodine, Kiwi extract, Naproxen, Tramadol, and Latex  Review of Systems   Review of Systems  Constitutional: Negative for chills and fever.  HENT: Negative for ear pain and sore throat.   Eyes: Negative for pain and visual disturbance.  Respiratory: Positive for cough and shortness of breath.   Cardiovascular: Positive for chest pain (with cough).  Gastrointestinal: Positive for nausea. Negative for abdominal pain, constipation, diarrhea and vomiting.  Genitourinary: Negative for dysuria and hematuria.  Musculoskeletal: Negative for back pain.  Skin: Negative for rash.  Neurological: Negative for headaches.  All other systems reviewed and are negative.   Physical Exam Updated Vital Signs BP (!) 125/97 (BP Location: Right Arm)   Pulse 81   Temp 98.2 F (36.8 C) (Temporal)   Resp 18   SpO2 96%   Physical Exam Vitals and nursing note reviewed.  Constitutional:      Appearance: He is well-developed and well-nourished.  HENT:     Head: Normocephalic and atraumatic.  Eyes:     Conjunctiva/sclera: Conjunctivae normal.  Cardiovascular:     Rate and Rhythm: Normal rate and regular rhythm.     Heart sounds: Normal heart sounds. No murmur heard.   Pulmonary:     Effort: Pulmonary effort is normal. No respiratory distress.     Breath sounds: Wheezing present. No rhonchi or rales.  Abdominal:     General: Bowel sounds are normal.     Palpations: Abdomen is soft.      Tenderness: There is no abdominal tenderness. There is no guarding or rebound.  Musculoskeletal:        General: No edema.     Cervical back: Neck supple.  Skin:    General: Skin is warm and dry.  Neurological:     Mental Status: He is alert.  Psychiatric:        Mood and Affect: Mood and affect normal.     ED Results / Procedures / Treatments   Labs (all labs ordered are listed, but only abnormal results are displayed) Labs Reviewed  BASIC METABOLIC PANEL - Abnormal; Notable for the following components:      Result Value   Creatinine, Ser 1.28 (*)    All other components within normal limits  CBC - Abnormal; Notable  for the following components:   MCH 34.4 (*)    All other components within normal limits  RESP PANEL BY RT-PCR (FLU A&B, COVID) ARPGX2  POC SARS CORONAVIRUS 2 AG -  ED    EKG None  Radiology DG Chest Portable 1 View  Result Date: 08/09/2020 CLINICAL DATA:  Fever, shortness of breath, and wheezing for 4 days. EXAM: PORTABLE CHEST 1 VIEW COMPARISON:  09/13/2019 FINDINGS: Focal infiltration or atelectasis in the right lung base, generally less prominent than on the previous study. This may represent recurrent pneumonia. Left lung is clear. Emphysematous changes in both lungs. No pleural effusions. No pneumothorax. Heart size and pulmonary vascularity are normal. Old bilateral rib fractures. IMPRESSION: Focal infiltration or atelectasis in the right lung base, likely representing pneumonia. Electronically Signed   By: Burman Nieves M.D.   On: 08/09/2020 19:34    Procedures Procedures (including critical care time)  Medications Ordered in ED Medications  albuterol (VENTOLIN HFA) 108 (90 Base) MCG/ACT inhaler 2 puff (2 puffs Inhalation Given 08/09/20 1935)  acetaminophen (TYLENOL) tablet 650 mg (has no administration in time range)  albuterol (VENTOLIN HFA) 108 (90 Base) MCG/ACT inhaler 6 puff (has no administration in time range)  predniSONE (DELTASONE)  tablet 60 mg (has no administration in time range)  acetaminophen (TYLENOL) tablet 650 mg (650 mg Oral Given 08/09/20 1934)    ED Course  I have reviewed the triage vital signs and the nursing notes.  Pertinent labs & imaging results that were available during my care of the patient were reviewed by me and considered in my medical decision making (see chart for details).    MDM Rules/Calculators/A&P                          50 year old male with history of COPD presenting the emergency department today for evaluation of cough, shortness of breath and fevers.  Reviewed/interpreted lab CBC is without leukocytosis or anemia BMP with elevated creatinine which appears to be baseline Covid antigen testing is negative Covid PCR is pending  EKG with NSR, rightward axis  CXR reviewed/interpreted - Focal infiltration or atelectasis in the right lung base, likely representing pneumonia.  I personally ambulated the patient in the room and sats remained at around 93% on room air.  He did briefly drop to 91% however this was not a good [place] I feel this was likely fictitious in nature.  He appeared comfortable and not dyspneic during ambulation.  Patient with pneumonia, also likely concurrent COPD exacerbation.  Will treat with antibiotics, steroids and have him continue his inhalers at home.  Covid is pending at the time of discharge.  He will follow up on MyChart regards to this.  He does not appear to require admission at this time and feel he is appropriate for discharge have advised close PCP follow-up and strict return precautions.  All questions answered.  Patient stable for discharge.  Keith Weber was evaluated in Emergency Department on 08/10/2020 for the symptoms described in the history of present illness. He was evaluated in the context of the global COVID-19 pandemic, which necessitated consideration that the patient might be at risk for infection with the SARS-CoV-2 virus that causes  COVID-19. Institutional protocols and algorithms that pertain to the evaluation of patients at risk for COVID-19 are in a state of rapid change based on information released by regulatory bodies including the CDC and federal and state organizations. These policies and algorithms were followed  during the patient's care in the ED.   Final Clinical Impression(s) / ED Diagnoses Final diagnoses:  Community acquired pneumonia, unspecified laterality    Rx / DC Orders ED Discharge Orders         Ordered    doxycycline (VIBRAMYCIN) 100 MG capsule  2 times daily        08/10/20 0448    predniSONE (DELTASONE) 50 MG tablet  Daily        08/10/20 0448           Kalayah Leske S, PA-C 08/10/20 0519    Ward, Layla Maw, DO 08/10/20 [phone]

## 2020-08-10 NOTE — Discharge Instructions (Signed)
You were given a prescription for antibiotics. Please take the antibiotic prescription fully.   Take the steroids as directed.   Your Covid test is pending at the time of discharge.  You can follow-up on this result via MyChart.  Please follow up with your primary care provider within 5-7 days for re-evaluation of your symptoms. If you do not have a primary care provider, information for a healthcare clinic has been provided for you to make arrangements for follow up care. Please return to the emergency department for any new or worsening symptoms.

## 2020-10-02 ENCOUNTER — Other Ambulatory Visit: Payer: Self-pay | Admitting: Nurse Practitioner

## 2020-10-02 DIAGNOSIS — K219 Gastro-esophageal reflux disease without esophagitis: Secondary | ICD-10-CM

## 2020-10-02 DIAGNOSIS — J45909 Unspecified asthma, uncomplicated: Secondary | ICD-10-CM

## 2020-10-02 DIAGNOSIS — J441 Chronic obstructive pulmonary disease with (acute) exacerbation: Secondary | ICD-10-CM

## 2020-10-02 MED FILL — ALBUTEROL SULFATE HFA 108 (: 108 (90 BAS | 25 days supply | Qty: 18 | Fill #0

## 2020-10-02 MED FILL — OMEPRAZOLE 20 MG CAP: 20 | 30 days supply | Qty: 30 | Fill #0

## 2020-10-02 MED FILL — IPRAT-ALBUT 0.5-3(2.5) MG/3: 0.5-2.5 (3) | 30 days supply | Qty: 360 | Fill #0

## 2020-10-02 NOTE — Telephone Encounter (Signed)
Patient called, advised of the need for an OV, he verbalized understanding. Appointment scheduled for CPE on 11/12/20 at 1450 with Bertram Denver, NP. Advised refills until appointment.

## 2020-10-02 NOTE — Telephone Encounter (Signed)
Requested medication (s) are due for refill today: Yes  Requested medication (s) are on the active medication list: Yes  Last refill:  09/20/19  Future visit scheduled: Yes  Notes to clinic:  Unable to refill per protocol, Rx expired.      Requested Prescriptions  Pending Prescriptions Disp Refills   BREO ELLIPTA 100-25 MCG/INH AEPB [Pharmacy Med Name: BREO ELLIPTA 100-25 MCG IN 100-25 Aerosol] 60 each 6    Sig: INHALE 1 PUFF INTO THE LUNGS EVERY MORNING.      Pulmonology:  Combination Products Passed - 10/02/2020  1:17 PM      Passed - Valid encounter within last 12 months    Recent Outpatient Visits           8 months ago Poor dentition   [facility] [place], Marzella Schlein, [place]   1 year ago Hospital discharge follow-up   [facility] [place], [place] W, NP   3 years ago Asthma, unspecified asthma severity, unspecified whether complicated, unspecified whether persistent   [facility] [place], Shea Stakes, NP   5 years ago Pulmonary emphysema, unspecified emphysema type [facility])   [place] [facility] Ambrose Finland, NP   5 years ago COPD exacerbation   [facility] Amb[place], NP       Future Appointments             In 1 month Claiborne Rigg, NP [place] [employer] [facility]              Signed Prescriptions Disp Refills   ipratropium-albuterol (DUONEB) 0.5-2.5 (3) MG/3ML SOLN 360 mL 0    Sig: Take 3 mLs by nebulization every 6 (six) hours as needed. OFFICE VISIT NEEDED FOR ADDITIONAL REFILLS      Pulmonology:  Combination Products Passed - 10/02/2020  1:17 PM      Passed - Valid encounter within last 12 months    Recent Outpatient Visits           8 months ago Poor dentition   [facility] [place], Marzella Schlein, [place]   1 year ago Hospital discharge follow-up   [facility] [place], [place] W, NP   3 years ago Asthma, unspecified asthma severity, unspecified whether complicated, unspecified whether persistent   [facility] [place], Shea Stakes, NP   5 years ago Pulmonary emphysema, unspecified emphysema type [facility])   [facility] Ambrose Finland, NP   5 years ago COPD exacerbation   [place] [place], Raenette Rover, NP       Future Appointments             In 1 month Claiborne Rigg, NP  [facility]               albuterol (VENTOLIN HFA) 108 (90 Base) MCG/ACT inhaler 18 g 1    Sig: Inhale 1-2 puffs into the lungs every 6 (six) hours as needed for wheezing or shortness of breath. OFFICE VISIT NEEDED FOR ADDITIONAL REFILLS      Pulmonology:  Beta Agonists Failed - 10/02/2020  1:17 PM      Failed - One inhaler should last at least one month. If the patient is requesting refills earlier, contact the patient to check for uncontrolled symptoms.  Passed - Valid encounter within last 12 months    Recent Outpatient Visits           8 months ago Poor dentition   [facility] [place], Marzella Schlein, [place]   1 year ago Hospital discharge follow-up   [facility] [place], [place] W, NP   3 years ago Asthma, unspecified asthma severity, unspecified whether complicated, unspecified whether persistent   [facility] [place], Shea Stakes, NP   5 years ago Pulmonary emphysema, unspecified emphysema type [facility])   [place] [facility] Ambrose Finland, NP   5 years ago COPD exacerbation   [place] [address] [facility] Ambrose Finland, NP       Future Appointments             In 1 month Claiborne Rigg, NP [place] [facility]               omeprazole (PRILOSEC) 20 MG capsule 90 capsule 0    Sig: Take 1  capsule (20 mg total) by mouth daily. OFFICE VISIT NEEDED FOR ADDITIONAL REFILLS      Gastroenterology: Proton Pump Inhibitors Passed - 10/02/2020  1:17 PM      Passed - Valid encounter within last 12 months    Recent Outpatient Visits           8 months ago Poor dentition   [facility] [place], Marzella Schlein, [place]   1 year ago Hospital discharge follow-up   [facility] [place], [place] W, NP   3 years ago Asthma, unspecified asthma severity, unspecified whether complicated, unspecified whether persistent   [facility] [place], Shea Stakes, NP   5 years ago Pulmonary emphysema, unspecified emphysema type [facility])   [facility] Ambrose Finland, NP   5 years ago COPD exacerbation    [facility] Amb[place] Finland, NP       Future Appointments             In 1 month Claiborne Rigg, NP [employer] [facility]

## 2020-10-10 MED FILL — IPRAT-ALBUT 0.5-3(2.5) MG/3: 0.5-2.5 (3) | 30 days supply | Qty: 360 | Fill #0

## 2020-10-10 MED FILL — OMEPRAZOLE 20 MG CAP: 20 | 30 days supply | Qty: 30 | Fill #0

## 2020-10-10 MED FILL — ALBUTEROL SULFATE HFA 108 (: 108 (90 BAS | 25 days supply | Qty: 18 | Fill #0

## 2020-11-08 ENCOUNTER — Ambulatory Visit (HOSPITAL_COMMUNITY)
Admission: EM | Admit: 2020-11-08 | Discharge: 2020-11-08 | Disposition: A | Discharge status: Home / Self Care | Payer: Self-pay | Attending: Medical Oncology | Admitting: Medical Oncology

## 2020-11-08 ENCOUNTER — Encounter (HOSPITAL_COMMUNITY): Payer: Self-pay | Admitting: Medical Oncology

## 2020-11-08 DIAGNOSIS — K047 Periapical abscess without sinus: Secondary | ICD-10-CM

## 2020-11-08 MED ORDER — ACETAMINOPHEN 325 MG PO TABS
ORAL_TABLET | ORAL | Status: AC
  Filled 2020-11-08: qty 3

## 2020-11-08 MED ORDER — CLINDAMYCIN HCL 300 MG PO CAPS: 300.0000 mg | ORAL_CAPSULE | Freq: Three times a day (TID) | ORAL | 0 refills | Status: AC

## 2020-11-08 MED ORDER — ACETAMINOPHEN 325 MG PO TABS: 975.0000 mg | ORAL_TABLET | Freq: Once | ORAL | Status: DC

## 2020-11-08 NOTE — ED Provider Notes (Signed)
[facility]    CSN: [id number] Arrival date & time: 11/08/20  1708      History   Chief Complaint Chief Complaint  Patient presents with  . Dental Pain    HPI Keith Weber is a 50 y.o. male.   HPI  Dental Pain: Patient reports that he has had dental pain x1 day.  He feels that his wisdom tooth on the bottom left has become infected.  Pain described as aching in nature 9 out of 10.  He has tried Tylenol, Goody powders and ibuprofen with little relief.  No fevers, trouble breathing or trouble swallowing.  He does not currently have a dentist but states that he scheduled an appointment with [facility] for next week. He request pain medication in office today.   Past Medical History:  Diagnosis Date  . Asthma   . COPD (chronic obstructive pulmonary disease) (HCC)   . GERD (gastroesophageal reflux disease)   . Sciatic nerve pain     Patient Active Problem List   Diagnosis Date Noted  . Pneumothorax on right 09/09/2019  . CAP (community acquired pneumonia) 01/21/2016  . Right middle lobe pneumonia 01/21/2016  . Glucosuria with normal serum glucose 01/21/2016  . Lactic acidosis 01/21/2016  . CKD (chronic kidney disease) stage 3, GFR 30-59 ml/min (HCC) 01/21/2016  . Cigarette smoker 06/12/2015  . COPD pfts pending/ still smoking  06/11/2015  . Asthma, chronic 10/16/2014  . Closed fracture of right thumb 06/16/2014    Past Surgical History:  Procedure Laterality Date  . I & D EXTREMITY Right 06/16/2014   Procedure: IRRIGATION AND DEBRIDEMENT RIGHT THUMB AND FIRST FINGER AND PINNING ;  Surgeon: Dominica Severin, MD;  [facility];  Service: Orthopedics;  Laterality: Right;  . IRRIGATION AND DEBRIDEMENT KNEE Right 06/16/2014   Procedure: IRRIGATION AND DEBRIDEMENT POSTERIOR KNEE;  Surgeon: Dominica Severin, MD;  [facility];  Service: Orthopedics;  Laterality: Right;  . LACERATION REPAIR Right 06/16/2014   Procedure: REPAIR ODF THREE LACERATIONS POSTERIOR KNEE;   Surgeon: Dominica Severin, MD;  [facility];  Service: Orthopedics;  Laterality: Right;       Home Medications    Prior to Admission medications   Medication Sig Start Date End Date Taking? Authorizing Provider  clindamycin (CLEOCIN) 300 MG capsule Take 1 capsule (300 mg total) by mouth 3 (three) times daily for 7 days. 11/08/20 11/15/20 Yes Naylene Foell M, PA-C  acetaminophen (TYLENOL) 500 MG tablet Take 2 tablets (1,000 mg total) by mouth every 6 (six) hours as needed. 09/14/19   Meuth, Brooke A, PA-C  albuterol (VENTOLIN HFA) 108 (90 Base) MCG/ACT inhaler Inhale 1-2 puffs into the lungs every 6 (six) hours as needed for wheezing or shortness of breath. OFFICE VISIT NEEDED FOR ADDITIONAL REFILLS 10/02/20   Claiborne Rigg, NP  betamethasone dipropionate 0.05 % cream Apply topically 2 (two) times daily. 03/07/20   Claiborne Rigg, NP  docusate sodium (COLACE) 100 MG capsule Take 1 capsule (100 mg total) by mouth 2 (two) times daily. 09/14/19   Meuth, Lina Sar, PA-C  guaiFENesin (MUCINEX) 600 MG 12 hr tablet Take 1 tablet (600 mg total) by mouth 2 (two) times daily. To loosen phlegm/mucus 09/14/19   Meuth, Nehemiah Settle A, PA-C  ipratropium-albuterol (DUONEB) 0.5-2.5 (3) MG/3ML SOLN Take 3 mLs by nebulization every 6 (six) hours as needed. OFFICE VISIT NEEDED FOR ADDITIONAL REFILLS 10/02/20   Claiborne Rigg, NP  methocarbamol (ROBAXIN) 500 MG tablet Take 2 tablets (1,000  mg total) by mouth every 8 (eight) hours as needed for muscle spasms. 01/11/20   Anders Simmonds, PA-C  Multiple Vitamin (MULTIVITAMIN WITH MINERALS) TABS tablet Take 1 tablet by mouth daily.    [provider]  omeprazole (PRILOSEC) 20 MG capsule Take 1 capsule (20 mg total) by mouth daily. OFFICE VISIT NEEDED FOR ADDITIONAL REFILLS 10/02/20   Claiborne Rigg, NP  sodium chloride (OCEAN) 0.65 % SOLN nasal spray Place 1 spray into both nostrils as needed for congestion. 09/14/19   Meuth, Lina Sar, PA-C    Family History Family  History  Family history unknown: Yes    Social History Social History   Tobacco Use  . Smoking status: Former Smoker    Packs/day: 0.00    Years: 31.00    Pack years: 0.00    Types: Cigars  . Smokeless tobacco: Never Used  Substance Use Topics  . Alcohol use: Yes    Alcohol/week: 1.0 standard drink    Types: 1 Cans of beer per week    Comment: occ  . Drug use: Yes    Types: Marijuana     Allergies   Aspirin, Peanut-containing drug products, Shellfish allergy, Amoxicillin, Iodine, Kiwi extract, Naproxen, Tramadol, and Latex   Review of Systems Review of Systems  As stated above in HPI Physical Exam Triage Vital Signs ED Triage Vitals  Enc Vitals Group     BP 11/08/20 1723 (!) 169/107     Pulse Rate 11/08/20 1723 92     Resp 11/08/20 1723 18     Temp 11/08/20 1723 98.4 F (36.9 C)     Temp Source 11/08/20 1723 Oral     SpO2 11/08/20 1723 99 %     Weight --      Height --      Head Circumference --      Peak Flow --      Pain Score 11/08/20 1721 9     Pain Loc --      Pain Edu? --      Excl. in GC? --    No data found.  Updated Vital Signs BP (!) 169/107 (BP Location: Right Arm)   Pulse 92   Temp 98.4 F (36.9 C) (Oral)   Resp 18   SpO2 99%   Physical Exam Vitals and nursing note reviewed.  Constitutional:      General: He is not in acute distress.    Appearance: Normal appearance. He is not ill-appearing, toxic-appearing or diaphoretic.  HENT:     Head: Normocephalic and atraumatic.     Right Ear: Tympanic membrane, ear canal and external ear normal.     Left Ear: Tympanic membrane, ear canal and external ear normal.     Nose: Nose normal.     Mouth/Throat:     Dentition: Abnormal dentition. Dental tenderness, dental caries, dental abscesses and gum lesions present.     Tongue: No lesions. Tongue does not deviate from midline.     Palate: No mass and lesions.     Pharynx: Oropharynx is clear. Uvula midline. No pharyngeal swelling,  oropharyngeal exudate, posterior oropharyngeal erythema or uvula swelling.     Tonsils: No tonsillar exudate or tonsillar abscesses.      Comments: No tenderness, edema or bogginess of superior or inferior palate  Eyes:     Extraocular Movements: Extraocular movements intact.     Pupils: Pupils are equal, round, and reactive to light.  Cardiovascular:     Rate and  Rhythm: Normal rate and regular rhythm.     Heart sounds: Normal heart sounds.  Pulmonary:     Effort: Pulmonary effort is normal.     Breath sounds: Normal breath sounds.  Musculoskeletal:     Cervical back: Neck supple.  Lymphadenopathy:     Cervical: No cervical adenopathy.  Neurological:     Mental Status: He is alert.      UC Treatments / Results  Labs (all labs ordered are listed, but only abnormal results are displayed) Labs Reviewed - No data to display  EKG   Radiology No results found.  Procedures Procedures (including critical care time)  Medications Ordered in UC Medications  acetaminophen (TYLENOL) tablet 975 mg (has no administration in time range)    Initial Impression / Assessment and Plan / UC Course  I have reviewed the triage vital signs and the nursing notes.  Pertinent labs & imaging results that were available during my care of the patient were reviewed by me and considered in my medical decision making (see chart for details).     New.  Treating with clindamycin to prevent systemic infection and complication.  Discussed how to use.  Offering him Tylenol today in office as given his severe allergy to NSAIDs Toradol would not be recommended.  He does not have a driver and therefore I would not feel comfortable with him having anything stronger than Tylenol.  We discussed that I am glad that he has the appointment [facility] and that I would like for him to closely monitor symptoms.  Discussed red flag signs and symptoms. Final Clinical Impressions(s) / UC Diagnoses   Final diagnoses:   Dental abscess   Discharge Instructions   None    ED Prescriptions    Medication Sig Dispense Auth. Provider   clindamycin (CLEOCIN) 300 MG capsule Take 1 capsule (300 mg total) by mouth 3 (three) times daily for 7 days. 21 capsule Rushie Chestnut, [place]     PDMP not reviewed this encounter.   Rushie Chestnut, [place] 11/08/20 1757

## 2020-11-08 NOTE — ED Triage Notes (Signed)
Pt presents with dental pain x 1 day. Tylenol gives no relief, last dose over 6 hrs ago.

## 2020-11-08 NOTE — ED Notes (Signed)
Pt angrily states he is leaving UC to for to [facility] ED because "all he is receiving is Tylenol for his dental pain." States has been taking tylenol PM at home and no relief. Advised pt due to his allergies, Tylenol is the only medication he can receive. Pt stated "just give me the tylenol and ill go to the ED and get more medication"  As patient was walking out of room states "that's why people shoot this place up."   Security call and charge nurse notified at [facility] ED.

## 2020-11-12 ENCOUNTER — Encounter: Payer: 59 | Admitting: Nurse Practitioner

## 2020-12-02 ENCOUNTER — Ambulatory Visit: Payer: Self-pay | Admitting: Nurse Practitioner

## 2021-02-04 ENCOUNTER — Telehealth: Payer: Self-pay | Admitting: Nurse Practitioner

## 2021-02-04 ENCOUNTER — Ambulatory Visit: Payer: Self-pay | Admitting: Nurse Practitioner

## 2021-03-12 ENCOUNTER — Other Ambulatory Visit: Payer: Self-pay

## 2021-03-12 ENCOUNTER — Ambulatory Visit: Payer: Self-pay | Attending: Nurse Practitioner | Admitting: Nurse Practitioner

## 2021-03-12 ENCOUNTER — Encounter: Payer: Self-pay | Admitting: Nurse Practitioner

## 2021-03-12 DIAGNOSIS — Z1211 Encounter for screening for malignant neoplasm of colon: Secondary | ICD-10-CM

## 2021-03-12 DIAGNOSIS — E785 Hyperlipidemia, unspecified: Secondary | ICD-10-CM

## 2021-03-12 DIAGNOSIS — Z1159 Encounter for screening for other viral diseases: Secondary | ICD-10-CM

## 2021-03-12 DIAGNOSIS — N1832 Chronic kidney disease, stage 3b: Secondary | ICD-10-CM

## 2021-03-12 DIAGNOSIS — J441 Chronic obstructive pulmonary disease with (acute) exacerbation: Secondary | ICD-10-CM

## 2021-03-12 DIAGNOSIS — D649 Anemia, unspecified: Secondary | ICD-10-CM

## 2021-03-12 DIAGNOSIS — K219 Gastro-esophageal reflux disease without esophagitis: Secondary | ICD-10-CM

## 2021-03-12 DIAGNOSIS — J4520 Mild intermittent asthma, uncomplicated: Secondary | ICD-10-CM

## 2021-03-12 MED ORDER — OMEPRAZOLE 20 MG PO CPDR
20.0000 mg | DELAYED_RELEASE_CAPSULE | Freq: Every day | ORAL | 3 refills | Status: DC
  Filled 2021-03-12 – 2021-04-07 (×2): qty 30, 30d supply, fill #0
  Filled 2021-07-15: qty 30, 30d supply, fill #1
  Filled 2021-08-26: qty 30, 30d supply, fill #0
  Filled 2021-10-22: qty 30, 30d supply, fill #1
  Filled 2022-01-01: qty 30, 30d supply, fill #2
  Filled 2022-01-26 – 2022-02-11 (×2): qty 30, 30d supply, fill #3

## 2021-03-12 MED ORDER — IPRATROPIUM-ALBUTEROL 0.5-2.5 (3) MG/3ML IN SOLN
3.0000 mL | Freq: Four times a day (QID) | RESPIRATORY_TRACT | 6 refills | Status: DC | PRN
  Filled 2021-03-12 – 2021-04-07 (×2): qty 360, 30d supply, fill #0

## 2021-03-12 MED ORDER — ALBUTEROL SULFATE HFA 108 (90 BASE) MCG/ACT IN AERS
1.0000 | INHALATION_SPRAY | Freq: Four times a day (QID) | RESPIRATORY_TRACT | 1 refills | Status: DC | PRN
  Filled 2021-03-12 – 2021-04-07 (×2): qty 18, 25d supply, fill #0
  Filled 2021-07-15: qty 18, 25d supply, fill #1

## 2021-03-12 NOTE — Progress Notes (Signed)
Virtual Visit via Telephone Note Due to national recommendations of social distancing due to [place] 19, telehealth visit is felt to be most appropriate for this patient at this time.  I discussed the limitations, risks, security and privacy concerns of performing an evaluation and management service by telephone and the availability of in person appointments. I also discussed with the patient that there may be a patient responsible charge related to this service. The patient expressed understanding and agreed to proceed.    I connected with Keith Weber on 03/12/21  at   9:30 AM EDT  EDT by telephone and verified that I am speaking with the correct person using two identifiers.  Location of Patient: Private Residence   Location of Provider: [place] [facility] [employer] Office    Persons participating in Telemedicine visit: Geryl Rankins FNP-BC Pardeep Pautz    History of Present Illness: Telemedicine visit for: Asthma He has a past medical history of Asthma, COPD, GERD and Sciatic nerve pain.   Asthma Well controlled with albuterol nebs and inhaler  GERD Symptoms well controlled with omeprazole 91m daily.    Past Medical History:  Diagnosis Date   Asthma    COPD (chronic obstructive pulmonary disease) (HCC)    GERD (gastroesophageal reflux disease)    Sciatic nerve pain     Past Surgical History:  Procedure Laterality Date   I & D EXTREMITY Right 06/16/2014   Procedure: IRRIGATION AND DEBRIDEMENT RIGHT THUMB AND FIRST FINGER AND PINNING ;  Surgeon: WRoseanne Kaufman MD;  [place]  Service: Orthopedics;  Laterality: Right;   IRRIGATION AND DEBRIDEMENT KNEE Right 06/16/2014   Procedure: IRRIGATION AND DEBRIDEMENT POSTERIOR KNEE;  Surgeon: WRoseanne Kaufman MD;  [place] [facility]  Service: Orthopedics;  Laterality: Right;   LACERATION REPAIR Right 06/16/2014   Procedure: REPAIR ODF THREE LACERATIONS POSTERIOR KNEE;  Surgeon: WRoseanne Kaufman MD;  [place] [facility]   Service: Orthopedics;  Laterality: Right;    Family History  Family history unknown: Yes    Social History   Socioeconomic History   Marital status: Single    Spouse name: Not on file   Number of children: Not on file   Years of education: Not on file   Highest education level: Not on file  Occupational History   Not on file  Tobacco Use   Smoking status: Former    Packs/day: 0.00    Years: 31.00    Pack years: 0.00    Types: Cigars, Cigarettes   Smokeless tobacco: Never  Substance and Sexual Activity   Alcohol use: Yes    Alcohol/week: 1.0 standard drink    Types: 1 Cans of beer per week    Comment: occ   Drug use: Yes    Types: Marijuana   Sexual activity: Not on file  Other Topics Concern   Not on file  Social History Narrative   Not on file   Social Determinants of Health   Financial Resource Strain: Not on file  Food Insecurity: Not on file  Transportation Needs: Not on file  Physical Activity: Not on file  Stress: Not on file  Social Connections: Not on file     Observations/Objective: Awake, alert and oriented x 3   Review of Systems  Constitutional:  Negative for fever, malaise/fatigue and weight loss.  HENT: Negative.  Negative for nosebleeds.   Eyes: Negative.  Negative for blurred vision, double vision and photophobia.  Respiratory: Negative.  Negative for cough, shortness of breath and wheezing.  Cardiovascular: Negative.  Negative for chest pain, palpitations and leg swelling.  Gastrointestinal: Negative.  Negative for heartburn, nausea and vomiting.  Musculoskeletal:  Negative for joint pain and myalgias.  Neurological: Negative.  Negative for dizziness, focal weakness, seizures and headaches.  Psychiatric/Behavioral: Negative.  Negative for suicidal ideas.    Assessment and Plan: Diagnoses and all orders for this visit:  Mild intermittent chronic asthma without complication -     ipratropium-albuterol (DUONEB) 0.5-2.5 (3) MG/3ML SOLN;  Take 3 mLs by nebulization every 6 (six) hours as needed. -     albuterol (VENTOLIN HFA) 108 (90 Base) MCG/ACT inhaler; Inhale 1-2 puffs into the lungs every 6 (six) hours as needed for wheezing or shortness of breath.  Colon cancer screening -     Fecal occult blood, imunochemical([facility]corp/Sun[facility]); Future  Stage 3b chronic kidney disease ([place]) -     CMP14+EGFR; Future  Anemia, unspecified type -     CBC; Future  Dyslipidemia, goal LDL below 100 -     Lipid panel; Future INSTRUCTIONS: Work on a low fat, heart healthy diet and participate in regular aerobic exercise program by working out at least 150 minutes per week; 5 days a week-30 minutes per day. Avoid red meat/beef/steak,  fried foods. junk foods, sodas, sugary drinks, unhealthy snacking, alcohol and smoking.  Drink at least 80 oz of water per day and monitor your carbohydrate intake daily.    Need for hepatitis C screening test -     HCV Ab w Reflex to Quant PCR; Future  Gastroesophageal reflux disease without esophagitis -     omeprazole (PRILOSEC) 20 MG capsule; Take 1 capsule (20 mg total) by mouth daily. INSTRUCTIONS: Avoid GERD Triggers: acidic, spicy or fried foods, caffeine, coffee, sodas,  alcohol and chocolate.    COPD exacerbation ([place]) -     ipratropium-albuterol (DUONEB) 0.5-2.5 (3) MG/3ML SOLN; Take 3 mLs by nebulization every 6 (six) hours as needed.    Follow Up Instructions Return in about 3 months (around 06/12/2021).     I discussed the assessment and treatment plan with the patient. The patient was provided an opportunity to ask questions and all were answered. The patient agreed with the plan and demonstrated an understanding of the instructions.   The patient was advised to call back or seek an in-person evaluation if the symptoms worsen or if the condition fails to improve as anticipated.  I provided 14 minutes of non-face-to-face time during this encounter including median intraservice time, reviewing  previous notes, labs, imaging, medications and explaining diagnosis and management.  Gildardo Pounds, FNP-BC

## 2021-03-19 ENCOUNTER — Other Ambulatory Visit: Payer: Self-pay

## 2021-04-07 ENCOUNTER — Other Ambulatory Visit: Payer: Self-pay

## 2021-04-09 ENCOUNTER — Other Ambulatory Visit: Payer: Self-pay

## 2021-07-15 ENCOUNTER — Other Ambulatory Visit: Payer: Self-pay

## 2021-07-16 ENCOUNTER — Other Ambulatory Visit: Payer: Self-pay

## 2021-08-08 ENCOUNTER — Other Ambulatory Visit: Payer: Self-pay

## 2021-08-08 ENCOUNTER — Ambulatory Visit: Payer: Self-pay

## 2021-08-08 ENCOUNTER — Other Ambulatory Visit: Payer: Self-pay | Admitting: Nurse Practitioner

## 2021-08-08 DIAGNOSIS — J4520 Mild intermittent asthma, uncomplicated: Secondary | ICD-10-CM

## 2021-08-08 MED ORDER — ALBUTEROL SULFATE HFA 108 (90 BASE) MCG/ACT IN AERS
1.0000 | INHALATION_SPRAY | Freq: Four times a day (QID) | RESPIRATORY_TRACT | 1 refills | Status: DC | PRN
  Filled 2021-08-08: qty 18, 25d supply, fill #0

## 2021-08-08 NOTE — Telephone Encounter (Signed)
Chief Complaint: Tooth pain  Symptoms: Face is tight left side, gum feels swollen, wisdom tooth Frequency: Started 2 days ago Pertinent Negatives: Patient denies other symptoms Disposition: [] ED /[] Urgent Care (no appt availability in office) / [] Appointment(In office/virtual)/ []  [place] [facility]/ [] Home Care/ [] Refused Recommended Disposition /[x] [place] [facility]/ []  Follow-up with PCP Additional Notes: No openings in the office today, agrees to location at [facility].    Summary: rx request / toothache   The patient's bottom left wisdom tooth is causing them significant discomfort and they would like to be prescribed antibiotics to help   The patient has experienced discomfort for roughly two days   Please contact further when possible      Reason for Disposition  [1] SEVERE pain (e.g., excruciating, unable to do any normal activities) AND [2] not improved 2 hours after pain medicine  Answer Assessment - Initial Assessment Questions 1. LOCATION: "Which tooth is hurting?"  (e.g., right-side/left-side, upper/lower, front/back)     Wisdom tooth on the left side 2. ONSET: "When did the toothache start?"  (e.g., hours, days)      2 days ago 3. SEVERITY: "How bad is the toothache?"  (Scale 1-10; mild, moderate or severe)   - MILD (1-3): doesn't interfere with chewing    - MODERATE (4-7): interferes with chewing, interferes with normal activities, awakens from sleep     - SEVERE (8-10): unable to eat, unable to do any normal activities, excruciating pain        6.5 4. SWELLING: "Is there any visible swelling of your face?"     Feel like cheek is tight, swollen gum 5. OTHER SYMPTOMS: "Do you have any other symptoms?" (e.g., fever)     No 6. PREGNANCY: "Is there any chance you are pregnant?" "When was your last menstrual period?"     N/A  Protocols used: Toothache-A-AH

## 2021-08-08 NOTE — Telephone Encounter (Signed)
Patient called to triage and he asked for a refill of albuterol inhaler to [facility].

## 2021-08-08 NOTE — Telephone Encounter (Signed)
Requested Prescriptions  Pending Prescriptions Disp Refills   albuterol (VENTOLIN HFA) 108 (90 Base) MCG/ACT inhaler 18 g 1    Sig: Inhale 1-2 puffs into the lungs every 6 (six) hours as needed for wheezing or shortness of breath.     Pulmonology:  Beta Agonists Failed - 08/08/2021 12:44 PM      Failed - One inhaler should last at least one month. If the patient is requesting refills earlier, contact the patient to check for uncontrolled symptoms.      Passed - Valid encounter within last 12 months    Recent Outpatient Visits          4 months ago Mild intermittent chronic asthma without complication   [facility] [place], Shea Stakes, NP   1 year ago Poor dentition   [facility] [place], Marzella Schlein, [place]   1 year ago Hospital discharge follow-up   [facility] [place], [place] W, NP   4 years ago Asthma, unspecified asthma severity, unspecified whether complicated, unspecified whether persistent   [facility] [place], Shea Stakes, NP   6 years ago Pulmonary emphysema, unspecified emphysema type [facility])   [facility] Ambrose Finland, NP

## 2021-08-12 ENCOUNTER — Other Ambulatory Visit: Payer: Self-pay | Admitting: Nurse Practitioner

## 2021-08-12 DIAGNOSIS — J441 Chronic obstructive pulmonary disease with (acute) exacerbation: Secondary | ICD-10-CM

## 2021-08-12 DIAGNOSIS — J4520 Mild intermittent asthma, uncomplicated: Secondary | ICD-10-CM

## 2021-08-12 NOTE — Telephone Encounter (Signed)
Copied from [place] [phone]. Topic: Quick Communication - Rx Refill/Question >> Aug 12, 2021 10:58 AM Robina Ade, Helene Kelp D wrote: Medication: albuterol (VENTOLIN HFA) 108 (90 Base) MCG/ACT inhaler, docusate sodium (COLACE) 100 MG capsule, ipratropium-albuterol (DUONEB) 0.5-2.5 (3) MG/3ML SOLN (Expired)  Has the patient contacted their pharmacy? No. (Agent: If no, request that the patient contact the pharmacy for the refill. If patient does not wish to contact the pharmacy document the reason why and proceed with request.) (Agent: If yes, when and what did the pharmacy advise?)  Preferred Pharmacy (with phone number or street name): [place] [facility] [address] Has the patient been seen for an appointment in the last year OR does the patient have an upcoming appointment? Yes.    Agent: Please be advised that RX refills may take up to 3 business days. We ask that you follow-up with your pharmacy.

## 2021-08-13 ENCOUNTER — Other Ambulatory Visit: Payer: Self-pay

## 2021-08-13 MED ORDER — ALBUTEROL SULFATE HFA 108 (90 BASE) MCG/ACT IN AERS
1.0000 | INHALATION_SPRAY | Freq: Four times a day (QID) | RESPIRATORY_TRACT | 1 refills | Status: DC | PRN
Start: 2021-08-13 — End: 2021-11-28
  Filled 2021-08-13 – 2021-08-26 (×2): qty 18, 25d supply, fill #0
  Filled 2021-10-13 – 2021-10-22 (×2): qty 18, 25d supply, fill #1

## 2021-08-13 MED ORDER — IPRATROPIUM-ALBUTEROL 0.5-2.5 (3) MG/3ML IN SOLN
3.0000 mL | Freq: Four times a day (QID) | RESPIRATORY_TRACT | 6 refills | Status: DC | PRN
  Filled 2021-08-13 – 2021-08-26 (×2): qty 360, 30d supply, fill #0
  Filled 2021-09-08: qty 180, 15d supply, fill #0
  Filled 2021-10-13 – 2021-10-22 (×2): qty 180, 15d supply, fill #1
  Filled 2022-01-01 – 2022-01-09 (×3): qty 180, 15d supply, fill #2
  Filled 2022-01-26 – 2022-02-11 (×2): qty 180, 15d supply, fill #3
  Filled 2022-03-13: qty 180, 15d supply, fill #4
  Filled 2022-04-14: qty 180, 15d supply, fill #5
  Filled 2022-05-15: qty 180, 15d supply, fill #6
  Filled 2022-05-19 – 2022-06-10 (×2): qty 180, 15d supply, fill #7
  Filled 2022-07-05: qty 180, 15d supply, fill #8

## 2021-08-13 NOTE — Telephone Encounter (Signed)
CAlled pt. He has not picked up albuterol as of yet, will do so. Aware ipratropium refills are available.

## 2021-08-13 NOTE — Telephone Encounter (Signed)
Requested medication (s) are due for refill today: yes  Requested medication (s) are on the active medication list: yes    Last refill: 09/14/19  #10  0 refills  Future visit scheduled no  Notes to clinic:not delegated  Requested Prescriptions  Pending Prescriptions Disp Refills   docusate sodium (COLACE) 100 MG capsule 10 capsule 0    Sig: Take 1 capsule (100 mg total) by mouth 2 (two) times daily.     Over the Counter:  OTC Passed - 08/12/2021  5:17 PM      Passed - Valid encounter within last 12 months    Recent Outpatient Visits           5 months ago Mild intermittent chronic asthma without complication   [place], Vernia Buff, NP   1 year ago Poor dentition   [place], [place], [place]   1 year ago Hospital discharge follow-up   [place], [place] W, NP   4 years ago Asthma, unspecified asthma severity, unspecified whether complicated, unspecified whether persistent   [place], [place] W, NP   6 years ago Pulmonary emphysema, unspecified emphysema type [facility])   [place], Valerie A, NP              Signed Prescriptions Disp Refills   albuterol (VENTOLIN HFA) 108 (90 Base) MCG/ACT inhaler 18 g 1    Sig: Inhale 1-2 puffs into the lungs every 6 (six) hours as needed for wheezing or shortness of breath.     Pulmonology:  Beta Agonists Failed - 08/12/2021  5:17 PM      Failed - One inhaler should last at least one month. If the patient is requesting refills earlier, contact the patient to check for uncontrolled symptoms.      Passed - Valid encounter within last 12 months    Recent Outpatient Visits           5 months ago Mild intermittent chronic asthma without complication   [place], Vernia Buff, NP   1 year ago Poor dentition   [place], Dionne Bucy, [place]   1 year ago Hospital discharge follow-up   [place], [place] W, NP   4 years ago Asthma, unspecified asthma severity, unspecified whether complicated, unspecified whether persistent   [place], [place] W, NP   6 years ago Pulmonary emphysema, unspecified emphysema type [facility])   [place], Mateo Flow A, NP               ipratropium-albuterol (DUONEB) 0.5-2.5 (3) MG/3ML SOLN 360 mL 6    Sig: Take 3 mLs by nebulization every 6 (six) hours as needed.     Pulmonology:  Combination Products Passed - 08/12/2021  5:17 PM      Passed - Valid encounter within last 12 months    Recent Outpatient Visits           5 months ago Mild intermittent chronic asthma without complication   [place], Vernia Buff, NP   1 year ago Poor dentition   [place], Dionne Bucy, [place]   1 year ago Hospital discharge follow-up   [facility] [place], [place] W, NP   4 years ago Asthma, unspecified asthma severity, unspecified whether complicated, unspecified whether persistent   [place], NP   6 years ago Pulmonary emphysema, unspecified emphysema type [facility])   [place], NP

## 2021-08-26 ENCOUNTER — Other Ambulatory Visit: Payer: Self-pay

## 2021-09-02 ENCOUNTER — Other Ambulatory Visit: Payer: Self-pay

## 2021-09-08 ENCOUNTER — Other Ambulatory Visit: Payer: Self-pay

## 2021-09-09 ENCOUNTER — Other Ambulatory Visit: Payer: Self-pay

## 2021-10-13 ENCOUNTER — Other Ambulatory Visit: Payer: Self-pay

## 2021-10-20 ENCOUNTER — Other Ambulatory Visit: Payer: Self-pay

## 2021-10-22 ENCOUNTER — Other Ambulatory Visit: Payer: Self-pay

## 2021-10-23 ENCOUNTER — Other Ambulatory Visit: Payer: Self-pay

## 2021-10-24 ENCOUNTER — Other Ambulatory Visit: Payer: Self-pay

## 2021-11-28 ENCOUNTER — Ambulatory Visit: Payer: Self-pay

## 2021-11-28 ENCOUNTER — Other Ambulatory Visit: Payer: Self-pay | Admitting: Nurse Practitioner

## 2021-11-28 ENCOUNTER — Telehealth: Payer: Self-pay | Admitting: Nurse Practitioner

## 2021-11-28 ENCOUNTER — Telehealth: Payer: Self-pay | Admitting: Physician Assistant

## 2021-11-28 ENCOUNTER — Other Ambulatory Visit: Payer: Self-pay

## 2021-11-28 DIAGNOSIS — J4531 Mild persistent asthma with (acute) exacerbation: Secondary | ICD-10-CM

## 2021-11-28 DIAGNOSIS — J4520 Mild intermittent asthma, uncomplicated: Secondary | ICD-10-CM

## 2021-11-28 MED ORDER — BECLOMETHASONE DIPROP HFA 40 MCG/ACT IN AERB
2.0000 | INHALATION_SPRAY | Freq: Two times a day (BID) | RESPIRATORY_TRACT | 0 refills | Status: DC
  Filled 2021-11-28: qty 1, fill #0

## 2021-11-28 MED ORDER — ALBUTEROL SULFATE HFA 108 (90 BASE) MCG/ACT IN AERS
1.0000 | INHALATION_SPRAY | Freq: Four times a day (QID) | RESPIRATORY_TRACT | 1 refills | Status: DC | PRN
  Filled 2021-11-28: qty 18, 25d supply, fill #0
  Filled 2022-01-01 – 2022-01-09 (×2): qty 18, 25d supply, fill #1

## 2021-11-28 MED ORDER — PULMICORT FLEXHALER 90 MCG/ACT IN AEPB
1.0000 | INHALATION_SPRAY | Freq: Two times a day (BID) | RESPIRATORY_TRACT | 0 refills | Status: DC
  Filled 2021-11-28: qty 1, 30d supply, fill #0

## 2021-11-28 NOTE — Telephone Encounter (Signed)
Copied from [place] [phone]. Topic: General - Other ?>> Nov 28, 2021  9:44 AM [name] A wrote: ?Reason for CRM: Patient called in to inform Keith Weber that his nebulizer machine is broken and he is needing a new one. He does have solutions but no machine. Per patient the last nebulizer he got was from [facility]  Please advise ?

## 2021-11-28 NOTE — Telephone Encounter (Signed)
fyi

## 2021-11-28 NOTE — Telephone Encounter (Signed)
I cannot approve orders for a new nebulizer. Will route request to patient's nurse.  ?

## 2021-11-28 NOTE — Patient Instructions (Signed)
?  [name], thank you for joining Piedad Climes, PA-C for today's virtual visit.  While this provider is not your primary care provider (PCP), if your PCP is located in our provider database this encounter information will be shared with them immediately following your visit. ? ?Consent: ?(Patient) Keith Weber provided verbal consent for this virtual visit at the beginning of the encounter. ? ?Current Medications: ? ?Current Outpatient Medications:  ?  acetaminophen (TYLENOL) 500 MG tablet, Take 2 tablets (1,000 mg total) by mouth every 6 (six) hours as needed., Disp: 30 tablet, Rfl: 0 ?  albuterol (VENTOLIN HFA) 108 (90 Base) MCG/ACT inhaler, Inhale 1-2 puffs into the lungs every 6 (six) hours as needed for wheezing or shortness of breath., Disp: 18 g, Rfl: 1 ?  betamethasone dipropionate 0.05 % cream, Apply topically 2 (two) times daily., Disp: 60 g, Rfl: 1 ?  docusate sodium (COLACE) 100 MG capsule, Take 1 capsule (100 mg total) by mouth 2 (two) times daily., Disp: 10 capsule, Rfl: 0 ?  ipratropium-albuterol (DUONEB) 0.5-2.5 (3) MG/3ML SOLN, Take 3 mLs by nebulization every 6 (six) hours as needed., Disp: 360 mL, Rfl: 6 ?  methocarbamol (ROBAXIN) 500 MG tablet, Take 2 tablets (1,000 mg total) by mouth every 8 (eight) hours as needed for muscle spasms., Disp: 30 tablet, Rfl: 0 ?  Multiple Vitamin (MULTIVITAMIN WITH MINERALS) TABS tablet, Take 1 tablet by mouth daily., Disp: , Rfl:  ?  omeprazole (PRILOSEC) 20 MG capsule, Take 1 capsule (20 mg total) by mouth daily., Disp: 90 capsule, Rfl: 3 ?  sodium chloride (OCEAN) 0.65 % SOLN nasal spray, Place 1 spray into both nostrils as needed for congestion., Disp:  , Rfl: 0  ? ?Medications ordered in this encounter:  ?No orders of the defined types were placed in this encounter. ?  ? ?*If you need refills on other medications prior to your next appointment, please contact your pharmacy* ? ?Follow-Up: ?Call back or seek an in-person evaluation if the  symptoms worsen or if the condition fails to improve as anticipated. ? ?Other Instructions ?Take the new maintenance inhaler as directed. ?Restart the Albuterol when needed but no more than as directed. ?I want you to schedule a follow-up with your primary provider to discuss asthma maintenance as it seems that has been uncontrolled for a while and now just worsened with mild exacerbation after running out of medication.  ? ?If anything worsens despite what I have given you, you need an in-person evaluation ASAP. DO NOT DELAY CARE.  ? ? ?If you have been instructed to have an in-person evaluation today at a local Urgent Care facility, please use the link below. It will take you to a list of all of our available [facility], including address, phone number and hours of operation. Please do not delay care.  ?[facility] ? ?If you or a family member do not have a primary care provider, use the link below to schedule a visit and establish care. When you choose a [facility] primary care physician or advanced practice provider, you gain a long-term partner in health. ?Find a Primary Care Provider ? ?Learn more about [facility]'s in-office and virtual care options: ?[facility] - Get Care Now  ?

## 2021-11-28 NOTE — Progress Notes (Signed)
?Virtual Visit Consent  ? ?Keith Weber, you are scheduled for a virtual visit with a [facility] provider today.   ?  ?Just as with appointments in the office, your consent must be obtained to participate.  Your consent will be active for this visit and any virtual visit you may have with one of our providers in the next 365 days.   ?  ?If you have a MyChart account, a copy of this consent can be sent to you electronically.  All virtual visits are billed to your insurance company just like a traditional visit in the office.   ? ?As this is a virtual visit, video technology does not allow for your provider to perform a traditional examination.  This may limit your provider's ability to fully assess your condition.  If your provider identifies any concerns that need to be evaluated in person or the need to arrange testing (such as labs, EKG, etc.), we will make arrangements to do so.   ?  ?Although advances in technology are sophisticated, we cannot ensure that it will always work on either your end or our end.  If the connection with a video visit is poor, the visit may have to be switched to a telephone visit.  With either a video or telephone visit, we are not always able to ensure that we have a secure connection.    ? ?Also, by engaging in this virtual visit, you consent to the provision of healthcare. Additionally, you authorize for your insurance to be billed (if applicable) for the services provided during this visit.  ? ?I need to obtain your verbal consent now.   Are you willing to proceed with your visit today?  ?  ?Trevius Kemper has provided verbal consent on 11/28/2021 for a virtual visit (video or telephone). ?  ?Leeanne Rio, PA-C  ? ?Date: 11/28/2021 11:14 AM ? ? ?Virtual Visit via Video Note  ? ?ILeeanne Rio, connected with  Keith Weber  ([id number], [birth year]) on 11/28/21 at 10:45 AM EDT by a video-enabled telemedicine application and verified that I am speaking with the  correct person using two identifiers. ? ?Location: ?Patient: Virtual Visit Location Patient: Home ?Provider: Virtual Visit Location Provider: Home Office ?  ?I discussed the limitations of evaluation and management by telemedicine and the availability of in person appointments. The patient expressed understanding and agreed to proceed.   ? ?History of Present Illness: ?Keith Weber is a 51 y.o. who identifies as a male who was assigned male at birth, and is being seen today for about a day or so of increased asthma symptoms with chest tightness and wheezing. 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 Uses rescue inhaler when needed along with albuterol neb. Notes he typically uses a few times daily on a regular basis but since things have worsened he has used a little more frequently. Has been out of the inhaler for about a week. Tried to do a nebulizer treatment today but machine is broke. Denies fever, chills, change in sputum. Denies sinus pain, ear pain or tooth pain. Denies chest pain.  ? ? ?HPI: HPI  ?Problems:  ?Patient Active Problem List  ? Diagnosis Date Noted  ? Pneumothorax on right 09/09/2019  ? CAP (community acquired pneumonia) 01/21/2016  ? Right middle lobe pneumonia 01/21/2016  ? Glucosuria with normal serum glucose 01/21/2016  ? Lactic acidosis 01/21/2016  ? CKD (chronic kidney disease) stage 3, GFR 30-59 ml/min (HCC) 01/21/2016  ?  Cigarette smoker 06/12/2015  ? COPD pfts pending/ still smoking  06/11/2015  ? Asthma, chronic 10/16/2014  ? Closed fracture of right thumb 06/16/2014  ?  ?Allergies:  ?Allergies  ?Allergen Reactions  ? Aspirin Anaphylaxis  ? Peanut-Containing Drug Products Anaphylaxis  ? Shellfish Allergy Anaphylaxis and Shortness Of Breath  ? Amoxicillin Hives  ?  Has patient had a PCN reaction causing immediate rash, facial/tongue/throat swelling, SOB or lightheadedness with hypotension:  ?Has patient had a PCN reaction causing severe rash involving mucus membranes  or skin necrosis:  ?Has patient had a PCN reaction that required hospitalization Yes ?Has patient had a PCN reaction occurring within the last 10 years: ?If all of the above answers are "NO", then may proceed with Cephalosporin use. ?  ? Iodine Nausea And Vomiting and Swelling  ? Kiwi Extract Swelling  ? Naproxen Hives  ? Tramadol   ?  Breathes funny  ? Latex Itching and Rash  ? ?Medications:  ?Current Outpatient Medications:  ?  Budesonide (PULMICORT FLEXHALER) 90 MCG/ACT inhaler, Inhale 1 puff into the lungs 2 (two) times daily., Disp: 1 each, Rfl: 0 ?  acetaminophen (TYLENOL) 500 MG tablet, Take 2 tablets (1,000 mg total) by mouth every 6 (six) hours as needed., Disp: 30 tablet, Rfl: 0 ?  albuterol (VENTOLIN HFA) 108 (90 Base) MCG/ACT inhaler, Inhale 1-2 puffs into the lungs every 6 (six) hours as needed for wheezing or shortness of breath., Disp: 18 g, Rfl: 1 ?  betamethasone dipropionate 0.05 % cream, Apply topically 2 (two) times daily., Disp: 60 g, Rfl: 1 ?  docusate sodium (COLACE) 100 MG capsule, Take 1 capsule (100 mg total) by mouth 2 (two) times daily., Disp: 10 capsule, Rfl: 0 ?  ipratropium-albuterol (DUONEB) 0.5-2.5 (3) MG/3ML SOLN, Take 3 mLs by nebulization every 6 (six) hours as needed., Disp: 360 mL, Rfl: 6 ?  methocarbamol (ROBAXIN) 500 MG tablet, Take 2 tablets (1,000 mg total) by mouth every 8 (eight) hours as needed for muscle spasms., Disp: 30 tablet, Rfl: 0 ?  Multiple Vitamin (MULTIVITAMIN WITH MINERALS) TABS tablet, Take 1 tablet by mouth daily., Disp: , Rfl:  ?  omeprazole (PRILOSEC) 20 MG capsule, Take 1 capsule (20 mg total) by mouth daily., Disp: 90 capsule, Rfl: 3 ?  sodium chloride (OCEAN) 0.65 % SOLN nasal spray, Place 1 spray into both nostrils as needed for congestion., Disp:  , Rfl: 0 ? ?Observations/Objective: ?Patient is well-developed, well-nourished in no acute distress.  ?Resting comfortably at home.  ?Head is normocephalic, atraumatic.  ?No labored breathing. ?Speech is  clear and coherent with logical content.  ?Patient is alert and oriented at baseline.  ? ?Assessment and Plan: ?1. Mild persistent asthma with exacerbation ?- Budesonide (PULMICORT FLEXHALER) 90 MCG/ACT inhaler; Inhale 1 puff into the lungs 2 (two) times daily.  Dispense: 1 each; Refill: 0 ? ?Uncontrolled asthma since regularly using albuterol 1-3 times per day. Now with increased symptoms as out of medications altogether. Has not been on a maintenance inhaler in some time per his report. Will start Pulmicort Flexhaler BID. Albuterol refilled. Per nurse triage note PCP is working on new nebulizer machine. Discussed even though Rx [facility] if the ICS is still too costly, can start alternative agent. Strict ER precautions reviewed.  ? ?Follow Up Instructions: ?I discussed the assessment and treatment plan with the patient. The patient was provided an opportunity to ask questions and all were answered. The patient agreed with the plan and demonstrated an  understanding of the instructions.  A copy of instructions were sent to the patient via MyChart unless otherwise noted below.  ? ?The patient was advised to call back or seek an in-person evaluation if the symptoms worsen or if the condition fails to improve as anticipated. ? ?Time:  ?I spent 10 minutes with the patient via telehealth technology discussing the above problems/concerns.   ? ?Leeanne Rio, PA-C ?

## 2021-11-28 NOTE — Telephone Encounter (Signed)
?  Chief Complaint: tight chest, cough ?Symptoms: productive cough SOB ?Frequency: today ?Pertinent Negatives: Patient denies wheezing ?Disposition: [] ED /[] Urgent Care (no appt availability in office) / [] Appointment(In office/virtual)/ [x]  [place] [facility]/ [] Home Care/ [] Refused Recommended Disposition /[] [place] [facility]/ []  Follow-up with PCP ?Additional Notes: made VV appt ? ? ? ? ? ? ?Answer Assessment - Initial Assessment Questions ?1. RESPIRATORY STATUS: "Describe your breathing?" (e.g., wheezing, shortness of breath, unable to speak, severe coughing)  ?    Chest tight, saline neb, SOB  ?2. ONSET: "When did this asthma attack begin?"  ?    This am  ?3. TRIGGER: "What do you think triggered this attack?" (e.g., URI, exposure to pollen or other allergen, tobacco smoke)  ?    *No Answer* ?4. PEAK EXPIRATORY FLOW RATE (PEFR): "Do you use a peak flow meter?" If Yes, ask: "What's the current peak flow? What's your personal best peak flow?"  ?    *No Answer* ?5. SEVERITY: "How bad is this attack?"  ?  - MILD: No SOB at rest, mild SOB with walking, speaks normally in sentences, can lie down, no retractions, pulse < 100. (GREEN Zone: PEFR 80-100%) ?  - MODERATE: SOB at rest, SOB with minimal exertion and prefers to sit, cannot lie down flat, speaks in phrases, mild retractions, audible wheezing, pulse 100-120. (YELLOW Zone: PEFR 50-79%)  ?  - SEVERE: Struggling for each breath, speaks in single words, struggling to breathe, sitting hunched forward, retractions, usually loud wheezing, sometimes minimal wheezing because of decreased air movement, pulse > 120. (RED Zone: PEFR < 50%).  ?    moderate ?6. ASTHMA MEDICINES:  "What treatments have you given?"  ?  - INHALED QUICK RELIEF (RESCUE): "What is your inhaled quick-relief medicine?" (e.g., albuterol, salbutamol) "Do you use an inhaler or a nebulizer?" "How frequently have you been using?" ?  - CONTROLLER (LONG-TERM-CONTROL): "Do you take an inhaled  steroid? (e.g., Asmanex, Flovent, Pulmicort, Qvar) ?    Neb is broken and no inhalers ?7. INHALED QUICK-RELIEF TREATMENTS FOR THIS ATTACK: "What treatments have you given yourself so far?" and "How many and how often?" If using an inhaler, ask, "How many puffs?" Note: Routine treatments are 2 puffs every 4 hours as needed. Rescue treatments are 4 puffs repeated every 20 minutes, up to three times as needed.  ?    *No Answer* ?8. OTHER SYMPTOMS: "Do you have any other symptoms? (e.g., chest pain, coughing up yellow sputum, fever, runny nose) ?    Productive cough (white)part looked gray ?9. O2 SATURATION MONITOR:  "Do you use an oxygen saturation monitor (pulse oximeter) at home?" If Yes, "What is your reading (oxygen level) today?" "What is your usual oxygen saturation reading?" (e.g., 95%) ?    *No Answer* ?10. PREGNANCY: "Is there any chance you are pregnant?" "When was your last menstrual period?" ?      *No Answer* ? ?Protocols used: Asthma Attack-A-AH ? ?

## 2021-11-28 NOTE — Telephone Encounter (Signed)
He needs to bring the broken machine back to the clinic or we can not replace it.  ?

## 2021-11-28 NOTE — Telephone Encounter (Signed)
Requested Prescriptions  ?Pending Prescriptions Disp Refills  ?? albuterol (VENTOLIN HFA) 108 (90 Base) MCG/ACT inhaler 18 g 1  ?  Sig: Inhale 1-2 puffs into the lungs every 6 (six) hours as needed for wheezing or shortness of breath.  ?  ? Pulmonology:  Beta Agonists 2 Failed - 11/28/2021  9:33 AM  ?  ?  Failed - Last BP in normal range  ?  BP Readings from Last 1 Encounters:  ?11/08/20 (!) 169/107  ?   ?  ?  Passed - Last Heart Rate in normal range  ?  Pulse Readings from Last 1 Encounters:  ?11/08/20 92  ?   ?  ?  Passed - Valid encounter within last 12 months  ?  Recent Outpatient Visits   ?      ? 8 months ago Mild intermittent chronic asthma without complication  ? [place], Vernia Buff, NP  ? 1 year ago Poor dentition  ? [place], Dionne Bucy, [place]  ? 2 years ago Hospital discharge follow-up  ? [place], [place] W, NP  ? 4 years ago Asthma, unspecified asthma severity, unspecified whether complicated, unspecified whether persistent  ? [place], [place] W, NP  ? 6 years ago Pulmonary emphysema, unspecified emphysema type ([place])  ? [place], NP  ?  ?  ?Future Appointments   ?        ? In 3 days [place], Loraine Grip, PA-C Primary Care at [facility]  ?  ? ?  ?  ?  ? ? ?

## 2021-12-01 ENCOUNTER — Ambulatory Visit: Payer: Self-pay | Admitting: Physician Assistant

## 2021-12-01 NOTE — Telephone Encounter (Signed)
Called and left vm  °

## 2021-12-02 ENCOUNTER — Other Ambulatory Visit: Payer: Self-pay

## 2021-12-16 ENCOUNTER — Telehealth: Payer: Self-pay | Admitting: Nurse Practitioner

## 2021-12-17 NOTE — Telephone Encounter (Signed)
Patient has an appointment to FU with you on 5/23.

## 2021-12-18 ENCOUNTER — Telehealth: Payer: Self-pay | Admitting: Nurse Practitioner

## 2021-12-18 ENCOUNTER — Other Ambulatory Visit: Payer: Self-pay

## 2021-12-22 NOTE — Telephone Encounter (Signed)
Please place patient on an open [place] slot please.

## 2021-12-30 ENCOUNTER — Ambulatory Visit: Payer: Self-pay | Admitting: Nurse Practitioner

## 2022-01-01 ENCOUNTER — Other Ambulatory Visit: Payer: Self-pay

## 2022-01-01 ENCOUNTER — Encounter: Payer: Self-pay | Admitting: Physician Assistant

## 2022-01-01 ENCOUNTER — Ambulatory Visit: Payer: Self-pay | Attending: Physician Assistant | Admitting: Physician Assistant

## 2022-01-01 VITALS — BP 132/78 | HR 83 | Wt 157.0 lb

## 2022-01-01 DIAGNOSIS — K429 Umbilical hernia without obstruction or gangrene: Secondary | ICD-10-CM

## 2022-01-01 DIAGNOSIS — Z1331 Encounter for screening for depression: Secondary | ICD-10-CM

## 2022-01-01 DIAGNOSIS — S62501P Fracture of unspecified phalanx of right thumb, subsequent encounter for fracture with malunion: Secondary | ICD-10-CM

## 2022-01-01 DIAGNOSIS — H1013 Acute atopic conjunctivitis, bilateral: Secondary | ICD-10-CM

## 2022-01-01 MED ORDER — OLOPATADINE HCL 0.1 % OP SOLN
1.0000 [drp] | Freq: Two times a day (BID) | OPHTHALMIC | 12 refills | Status: DC
  Filled 2022-01-01 – 2022-01-09 (×2): qty 5, 20d supply, fill #0
  Filled 2022-01-26: qty 5, 20d supply, fill #1
  Filled 2022-02-11: qty 5, 30d supply, fill #1

## 2022-01-01 NOTE — Progress Notes (Signed)
Patient ID: Keith Weber, male   DOB: [birth year], 51 y.o.   MRN: [id number]   Keith Weber, is a 51 y.o. male  TDD:[id number]  WCB:[id number]  DOB - 12-02-1970  No chief complaint on file.      Subjective:   Keith Weber is a 51 y.o. male here today for R hand that has never healed properly from a previous injury.  He needs referral and wants to have that fixed.   He has an umbilical hernia that seems to be getting bigger.  No acute pain.  #9 on phq9=1.  Fleeting thoughts of wishing he was not here bc of the state of the world and regrets and disappointments in life. Sober but not active in recovery.  Protective factors include faith in God, faily and friendships.  Denies plan or intent to harm himself  No problems updated.  ALLERGIES: Allergies  Allergen Reactions   Aspirin Anaphylaxis   Peanut-Containing Drug Products Anaphylaxis   Shellfish Allergy Anaphylaxis and Shortness Of Breath   Amoxicillin Hives    Has patient had a PCN reaction causing immediate rash, facial/tongue/throat swelling, SOB or lightheadedness with hypotension:  Has patient had a PCN reaction causing severe rash involving mucus membranes or skin necrosis:  Has patient had a PCN reaction that required hospitalization Yes Has patient had a PCN reaction occurring within the last 10 years: If all of the above answers are "NO", then may proceed with Cephalosporin use.    Iodine Nausea And Vomiting and Swelling   Kiwi Extract Swelling   Naproxen Hives   Tramadol     Breathes funny   Latex Itching and Rash    PAST MEDICAL HISTORY: Past Medical History:  Diagnosis Date   Asthma    COPD (chronic obstructive pulmonary disease) (HCC)    GERD (gastroesophageal reflux disease)    Sciatic nerve pain     MEDICATIONS AT HOME: Prior to Admission medications   Medication Sig Start Date End Date Taking? Authorizing Provider  acetaminophen (TYLENOL) 500 MG tablet Take 2 tablets (1,000 mg total) by mouth  every 6 (six) hours as needed. 09/14/19  Yes Meuth, Brooke A, PA-C  albuterol (VENTOLIN HFA) 108 (90 Base) MCG/ACT inhaler Inhale 1-2 puffs into the lungs every 6 (six) hours as needed for wheezing or shortness of breath. 11/28/21 11/28/22 Yes Claiborne Rigg, NP  betamethasone dipropionate 0.05 % cream Apply topically 2 (two) times daily. 03/07/20  Yes Claiborne Rigg, NP  Budesonide (PULMICORT FLEXHALER) 90 MCG/ACT inhaler Inhale 1 puff into the lungs 2 (two) times daily. 11/28/21  Yes Waldon Merl, PA-C  docusate sodium (COLACE) 100 MG capsule Take 1 capsule (100 mg total) by mouth 2 (two) times daily. 09/14/19  Yes Meuth, Brooke A, PA-C  methocarbamol (ROBAXIN) 500 MG tablet Take 2 tablets (1,000 mg total) by mouth every 8 (eight) hours as needed for muscle spasms. 01/11/20  Yes Anders Simmonds, PA-C  Multiple Vitamin (MULTIVITAMIN WITH MINERALS) TABS tablet Take 1 tablet by mouth daily.   Yes [provider]  olopatadine (PATANOL) 0.1 % ophthalmic solution Place 1 drop into both eyes 2 (two) times daily. 01/01/22  Yes Georgian Co M, PA-C  omeprazole (PRILOSEC) 20 MG capsule Take 1 capsule (20 mg total) by mouth daily. 03/12/21  Yes Claiborne Rigg, NP  sodium chloride (OCEAN) 0.65 % SOLN nasal spray Place 1 spray into both nostrils as needed for congestion. 09/14/19  Yes Meuth, Brooke A, PA-C  ipratropium-albuterol (DUONEB) 0.5-2.5 (3) MG/3ML  SOLN Take 3 mLs by nebulization every 6 (six) hours as needed. 08/13/21 01/16/22  Claiborne Rigg, NP    ROS: Neg HEENT Neg resp Neg cardiac Neg GI Neg GU Neg MS Neg neuro  Objective:   Vitals:   01/01/22 1429 01/01/22 1441  BP: (!) 150/110 132/78  Pulse: 83   SpO2: 97%   Weight: 157 lb (71.2 kg)    Exam General appearance : Awake, alert, not in any distress. Speech Clear. Not toxic looking HEENT: Atraumatic and Normocephalic Neck: Supple, no JVD. No cervical lymphadenopathy.  Chest: Good air entry bilaterally, CTAB.  No  rales/rhonchi/wheezing CVS: S1 S2 regular, no murmurs.  Large scarring and bony overgrowth and disruption over 5th metacarpal from previous injury  Umbilical hernia non tender about 3-4 cm; reducible Extremities: B/L Lower Ext shows no edema, both legs are warm to touch Neurology: Awake alert, and oriented X 3, CN II-XII intact, Non focal Skin: No Rash  Data Review Lab Results  Component Value Date   HGBA1C 5.2 01/21/2016    Assessment & Plan   1. Allergic conjunctivitis of both eyes - olopatadine (PATANOL) 0.1 % ophthalmic solution; Place 1 drop into both eyes 2 (two) times daily.  Dispense: 5 mL; Refill: 12  2. Umbilical hernia without obstruction and without gangrene - Ambulatory referral to General Surgery  3. Closed displaced fracture of phalanx of right thumb with malunion, unspecified phalanx, subsequent encounter - Ambulatory referral to Hand Surgery  Positive depression screening.  He is open to 12 step recovery.  [facility] [place].org is the website for narcotics anonymous [employer].cy (website) or 810-259-4417 is the information for alcoholics anonymous Both are free and immediately available for help with alcohol and drug use Does not wish to do counseling(he has done in the past) Spent >30 mins face to face on counseling related to this.  Encourage him to also stop watching the news   Return in about 6 months (around 07/04/2022) for PCP for chronic conditions.  The patient was given clear instructions to go to ER or return to medical center if symptoms don't improve, worsen or new problems develop. The patient verbalized understanding. The patient was told to call to get lab results if they haven't heard anything in the next week.      Georgian Co, PA-C [facility] [place], [place] [phone]   01/01/2022, 3:27 PM

## 2022-01-01 NOTE — Patient Instructions (Signed)
[place]org is the website for narcotics anonymous [employer] (website) or 336-854-4278 is the information for alcoholics anonymous Both are free and immediately available for help with alcohol and drug use  

## 2022-01-02 ENCOUNTER — Other Ambulatory Visit: Payer: Self-pay

## 2022-01-04 ENCOUNTER — Telehealth: Payer: Self-pay | Admitting: Nurse Practitioner

## 2022-01-04 NOTE — Telephone Encounter (Signed)
error 

## 2022-01-06 NOTE — Telephone Encounter (Addendum)
Patient was scheduled for an appt 01/01/2022 on 12/29/2021.

## 2022-01-08 ENCOUNTER — Other Ambulatory Visit: Payer: Self-pay

## 2022-01-08 ENCOUNTER — Ambulatory Visit: Payer: Self-pay | Admitting: Surgery

## 2022-01-09 ENCOUNTER — Other Ambulatory Visit: Payer: Self-pay

## 2022-01-13 ENCOUNTER — Ambulatory Visit: Payer: Self-pay | Admitting: Orthopedic Surgery

## 2022-01-22 ENCOUNTER — Ambulatory Visit: Payer: Self-pay | Admitting: Surgery

## 2022-01-26 ENCOUNTER — Other Ambulatory Visit: Payer: Self-pay

## 2022-01-26 ENCOUNTER — Other Ambulatory Visit: Payer: Self-pay | Admitting: Nurse Practitioner

## 2022-01-26 DIAGNOSIS — J4520 Mild intermittent asthma, uncomplicated: Secondary | ICD-10-CM

## 2022-01-26 MED ORDER — ALBUTEROL SULFATE HFA 108 (90 BASE) MCG/ACT IN AERS
1.0000 | INHALATION_SPRAY | Freq: Four times a day (QID) | RESPIRATORY_TRACT | 1 refills | Status: DC | PRN
  Filled 2022-01-26: qty 18, 25d supply, fill #0
  Filled 2022-02-11: qty 8.5, 25d supply, fill #0
  Filled 2022-03-13: qty 8.5, 25d supply, fill #1

## 2022-02-02 ENCOUNTER — Other Ambulatory Visit: Payer: Self-pay

## 2022-02-11 ENCOUNTER — Other Ambulatory Visit: Payer: Self-pay

## 2022-02-12 ENCOUNTER — Other Ambulatory Visit: Payer: Self-pay

## 2022-02-13 ENCOUNTER — Other Ambulatory Visit: Payer: Self-pay

## 2022-02-15 ENCOUNTER — Encounter (HOSPITAL_COMMUNITY): Payer: Self-pay | Admitting: Emergency Medicine

## 2022-02-15 ENCOUNTER — Emergency Department (HOSPITAL_COMMUNITY): Payer: Commercial Managed Care - HMO

## 2022-02-15 ENCOUNTER — Other Ambulatory Visit: Payer: Self-pay

## 2022-02-15 ENCOUNTER — Emergency Department (HOSPITAL_COMMUNITY)
Admission: EM | Admit: 2022-02-15 | Discharge: 2022-02-15 | Disposition: A | Discharge status: Home / Self Care | Payer: Commercial Managed Care - HMO | Attending: Emergency Medicine | Admitting: Emergency Medicine

## 2022-02-15 DIAGNOSIS — J449 Chronic obstructive pulmonary disease, unspecified: Secondary | ICD-10-CM | POA: Diagnosis not present

## 2022-02-15 DIAGNOSIS — J4520 Mild intermittent asthma, uncomplicated: Secondary | ICD-10-CM

## 2022-02-15 DIAGNOSIS — Z7951 Long term (current) use of inhaled steroids: Secondary | ICD-10-CM | POA: Diagnosis not present

## 2022-02-15 DIAGNOSIS — Z91040 Latex allergy status: Secondary | ICD-10-CM | POA: Insufficient documentation

## 2022-02-15 DIAGNOSIS — R062 Wheezing: Secondary | ICD-10-CM

## 2022-02-15 DIAGNOSIS — Z91010 Allergy to peanuts: Secondary | ICD-10-CM | POA: Insufficient documentation

## 2022-02-15 MED ORDER — PREDNISONE 10 MG PO TABS
40.0000 mg | ORAL_TABLET | Freq: Every day | ORAL | 0 refills | Status: AC
  Filled 2022-02-15: qty 20, 5d supply, fill #0

## 2022-02-15 MED ORDER — PREDNISONE 20 MG PO TABS
60.0000 mg | ORAL_TABLET | Freq: Once | ORAL | Status: AC
  Administered 2022-02-15: 60 mg via ORAL
  Filled 2022-02-15: qty 3

## 2022-02-15 MED ORDER — IPRATROPIUM-ALBUTEROL 0.5-2.5 (3) MG/3ML IN SOLN
3.0000 mL | RESPIRATORY_TRACT | Status: AC
  Administered 2022-02-15 (×2): 3 mL via RESPIRATORY_TRACT
  Filled 2022-02-15 (×2): qty 3

## 2022-02-15 MED ORDER — ALBUTEROL SULFATE HFA 108 (90 BASE) MCG/ACT IN AERS
1.0000 | INHALATION_SPRAY | Freq: Three times a day (TID) | RESPIRATORY_TRACT | Status: DC | PRN
  Filled 2022-02-15: qty 6.7

## 2022-02-15 MED ORDER — ALBUTEROL SULFATE (2.5 MG/3ML) 0.083% IN NEBU
5.0000 mg | INHALATION_SOLUTION | Freq: Once | RESPIRATORY_TRACT | Status: AC
  Administered 2022-02-15: 5 mg via RESPIRATORY_TRACT
  Filled 2022-02-15: qty 6

## 2022-02-15 NOTE — ED Triage Notes (Signed)
Patient arrived with EMS from home reports wheezing/asthma this week , he ran out of his rescue inhaler/nebulizer medications .

## 2022-02-15 NOTE — Discharge Instructions (Signed)
As we discussed, your work-up in the ER today was reassuring for acute abnormalities. I suspect that your symptoms are related to your asthma.  I have given you an albuterol inhaler to help manage the symptoms.  Also given you a prescription for steroids for you to take for the next few days for continued management of your symptoms.  It is very important that you follow-up with your primary care doctor in the next few days for continued evaluation and management of your asthma.  Return if development of any new or worsening symptoms.

## 2022-02-15 NOTE — ED Provider Notes (Signed)
[facility] EMERGENCY DEPARTMENT Provider Note   CSN: [id number] Arrival date & time: 02/15/22  0502     History  Chief Complaint  Patient presents with   Asthma    Wheezing    Flem Enderle is a 51 y.o. male.  Patient with history of COPD and asthma presents today with complaints of wheezing. States that he normally uses his inhalers daily for his symptoms, however he ran out yesterday and is unable to get refills until tomorrow. Requests an albuterol inhaler to hold him over until he is able to get his prescriptions tomorrow. Denies fevers, chills, chest pain, nausea, or vomiting.  The history is provided by the patient. No language interpreter was used.  Asthma       Home Medications Prior to Admission medications   Medication Sig Start Date End Date Taking? Authorizing Provider  acetaminophen (TYLENOL) 500 MG tablet Take 2 tablets (1,000 mg total) by mouth every 6 (six) hours as needed. 09/14/19   Meuth, Brooke A, PA-C  albuterol (VENTOLIN HFA) 108 (90 Base) MCG/ACT inhaler Inhale 1-2 puffs into the lungs every 6 (six) hours as needed for wheezing or shortness of breath. 01/26/22 01/26/23  Hoy Register, MD  betamethasone dipropionate 0.05 % cream Apply topically 2 (two) times daily. 03/07/20   Claiborne Rigg, NP  Budesonide (PULMICORT FLEXHALER) 90 MCG/ACT inhaler Inhale 1 puff into the lungs 2 (two) times daily. 11/28/21   Waldon Merl, PA-C  docusate sodium (COLACE) 100 MG capsule Take 1 capsule (100 mg total) by mouth 2 (two) times daily. 09/14/19   Meuth, Brooke A, PA-C  ipratropium-albuterol (DUONEB) 0.5-2.5 (3) MG/3ML SOLN Take 3 mLs by nebulization every 6 (six) hours as needed. 08/13/21 02/28/22  Claiborne Rigg, NP  methocarbamol (ROBAXIN) 500 MG tablet Take 2 tablets (1,000 mg total) by mouth every 8 (eight) hours as needed for muscle spasms. 01/11/20   Anders Simmonds, PA-C  Multiple Vitamin (MULTIVITAMIN WITH MINERALS) TABS tablet Take 1 tablet  by mouth daily.    [provider]  olopatadine (PATANOL) 0.1 % ophthalmic solution Place 1 drop into both eyes 2 (two) times daily. 01/01/22   Anders Simmonds, PA-C  omeprazole (PRILOSEC) 20 MG capsule Take 1 capsule (20 mg total) by mouth daily. 03/12/21   Claiborne Rigg, NP  sodium chloride (OCEAN) 0.65 % SOLN nasal spray Place 1 spray into both nostrils as needed for congestion. 09/14/19   Meuth, Brooke A, PA-C      Allergies    Aspirin, Peanut-containing drug products, Shellfish allergy, Amoxicillin, Iodine, Kiwi extract, Naproxen, Tramadol, and Latex    Review of Systems   Review of Systems  Respiratory:  Positive for wheezing.   All other systems reviewed and are negative.   Physical Exam Updated Vital Signs BP (!) 129/92   Pulse 75   Temp (!) 97.5 F (36.4 C)   Resp 14   SpO2 95%  Physical Exam Vitals and nursing note reviewed.  Constitutional:      General: He is not in acute distress.    Appearance: Normal appearance. He is normal weight. He is not ill-appearing, toxic-appearing or diaphoretic.  HENT:     Head: Normocephalic and atraumatic.  Cardiovascular:     Rate and Rhythm: Normal rate.  Pulmonary:     Effort: Pulmonary effort is normal. No respiratory distress.     Breath sounds: Wheezing present.  Musculoskeletal:        General: Normal range of  motion.     Cervical back: Normal range of motion.  Skin:    General: Skin is warm and dry.  Neurological:     General: No focal deficit present.     Mental Status: He is alert.  Psychiatric:        Mood and Affect: Mood normal.        Behavior: Behavior normal.     ED Results / Procedures / Treatments   Labs (all labs ordered are listed, but only abnormal results are displayed) Labs Reviewed - No data to display  EKG None  Radiology DG Chest 2 View  Result Date: 02/15/2022 CLINICAL DATA:  Wheezing with shortness of breath EXAM: CHEST - 2 VIEW COMPARISON:  08/09/2020 FINDINGS: Chronic  interstitial coarsening and generous lung volumes. There is history of COPD. There is no edema, consolidation, effusion, or pneumothorax. Normal heart size and mediastinal contours. Remote rib fractures which are healed. IMPRESSION: COPD without acute superimposed finding. Electronically Signed   By: Tiburcio Pea M.D.   On: 02/15/2022 06:23    Procedures Procedures    Medications Ordered in ED Medications  ipratropium-albuterol (DUONEB) 0.5-2.5 (3) MG/3ML nebulizer solution 3 mL (has no administration in time range)  albuterol (VENTOLIN HFA) 108 (90 Base) MCG/ACT inhaler 1 puff (has no administration in time range)  predniSONE (DELTASONE) tablet 60 mg (has no administration in time range)  albuterol (PROVENTIL) (2.5 MG/3ML) 0.083% nebulizer solution 5 mg (5 mg Nebulization Given 02/15/22 0509)    ED Course/ Medical Decision Making/ A&P                           Medical Decision Making Amount and/or Complexity of Data Reviewed Radiology: ordered.  Risk Prescription drug management.   Patient presents today with complaints of wheezing.  Patient has known asthma and COPD and ran out of his inhalers yesterday which prompted his visit today.  He is afebrile, nontoxic-appearing, and in no acute distress with reassuring vital signs. Chest x-ray obtained which revealed COPD without acute superimposed findings.  I have personally reviewed and interpreted this imaging and agree with radiology interpretation. After steroids and duo-nebs, patient states that he is feeling much better and lung sounds are significantly improved.  Given an albuterol inhaler to go home with as well as a prescription for prednisone to take for the next few days to help with any continued wheezing given that patient is not a diabetic.  He is otherwise stable for discharge at this time, educated on red flag symptoms that would prompt immediate return.  Emphasized the importance of filling his home inhaler medications as well  as following up with his primary care doctor in the next few days for continued evaluation and management.  Patient is understanding and amenable with plan, discharged in stable condition.   Final Clinical Impression(s) / ED Diagnoses Final diagnoses:  Wheezing  Mild intermittent asthma without complication    Rx / DC Orders ED Discharge Orders          Ordered    predniSONE (DELTASONE) 10 MG tablet  Daily        02/15/22 1508          An After Visit Summary was printed and given to the patient.     Vear Clock 02/15/22 1510    Glynn Octave, MD 02/15/22 [phone]

## 2022-02-15 NOTE — ED Notes (Signed)
Patient has been on his cellphone talking the entire time he had been back in treatment area. No resp. Distress.

## 2022-02-15 NOTE — ED Notes (Signed)
Patient with  insp and exp wheezing he is able to speak in complete sentences ,states he ran out of his inhalers yest.

## 2022-02-15 NOTE — ED Notes (Signed)
Turkey sandwich given 

## 2022-02-16 ENCOUNTER — Other Ambulatory Visit: Payer: Self-pay

## 2022-02-18 ENCOUNTER — Other Ambulatory Visit: Payer: Self-pay

## 2022-03-13 ENCOUNTER — Other Ambulatory Visit: Payer: Self-pay | Admitting: Nurse Practitioner

## 2022-03-13 ENCOUNTER — Other Ambulatory Visit: Payer: Self-pay

## 2022-03-13 DIAGNOSIS — K219 Gastro-esophageal reflux disease without esophagitis: Secondary | ICD-10-CM

## 2022-03-13 MED ORDER — OMEPRAZOLE 20 MG PO CPDR
20.0000 mg | DELAYED_RELEASE_CAPSULE | Freq: Every day | ORAL | 0 refills | Status: DC
  Filled 2022-03-13: qty 30, 30d supply, fill #0
  Filled 2022-04-14: qty 30, 30d supply, fill #1
  Filled 2022-05-11: qty 30, 30d supply, fill #2

## 2022-03-13 NOTE — Telephone Encounter (Signed)
Requested Prescriptions  Pending Prescriptions Disp Refills  . omeprazole (PRILOSEC) 20 MG capsule 90 capsule 0    Sig: Take 1 capsule (20 mg total) by mouth daily.     Gastroenterology: Proton Pump Inhibitors Passed - 03/13/2022  9:22 AM      Passed - Valid encounter within last 12 months    Recent Outpatient Visits          2 months ago Allergic conjunctivitis of both eyes   [facility] [place], [place], [place]   1 year ago Mild intermittent chronic asthma without complication   [facility] [place], Shea Stakes, NP   2 years ago Poor dentition   [facility] [place], Marzella [place], [place]   2 years ago Hospital discharge follow-up   [facility] [place], [place] W, NP   4 years ago Asthma, unspecified asthma severity, unspecified whether complicated, unspecified whether persistent   [facility] [place], Shea Stakes, NP      Future Appointments            In 5 days Claiborne Rigg, NP [employer] [facility]

## 2022-03-17 ENCOUNTER — Other Ambulatory Visit: Payer: Self-pay

## 2022-03-18 ENCOUNTER — Ambulatory Visit: Payer: Commercial Managed Care - HMO | Attending: Nurse Practitioner | Admitting: Nurse Practitioner

## 2022-03-18 ENCOUNTER — Other Ambulatory Visit (HOSPITAL_COMMUNITY): Payer: Self-pay

## 2022-03-18 ENCOUNTER — Encounter: Payer: Self-pay | Admitting: Nurse Practitioner

## 2022-03-18 ENCOUNTER — Other Ambulatory Visit: Payer: Self-pay

## 2022-03-18 ENCOUNTER — Other Ambulatory Visit: Payer: Self-pay | Admitting: Pharmacist

## 2022-03-18 VITALS — BP 127/79 | HR 73 | Temp 98.1°F | Ht 71.0 in | Wt 151.8 lb

## 2022-03-18 DIAGNOSIS — M21941 Unspecified acquired deformity of hand, right hand: Secondary | ICD-10-CM | POA: Diagnosis not present

## 2022-03-18 DIAGNOSIS — Z1159 Encounter for screening for other viral diseases: Secondary | ICD-10-CM

## 2022-03-18 DIAGNOSIS — Z0100 Encounter for examination of eyes and vision without abnormal findings: Secondary | ICD-10-CM | POA: Diagnosis not present

## 2022-03-18 DIAGNOSIS — J441 Chronic obstructive pulmonary disease with (acute) exacerbation: Secondary | ICD-10-CM

## 2022-03-18 DIAGNOSIS — J302 Other seasonal allergic rhinitis: Secondary | ICD-10-CM

## 2022-03-18 DIAGNOSIS — Z1211 Encounter for screening for malignant neoplasm of colon: Secondary | ICD-10-CM

## 2022-03-18 DIAGNOSIS — E785 Hyperlipidemia, unspecified: Secondary | ICD-10-CM

## 2022-03-18 DIAGNOSIS — R7989 Other specified abnormal findings of blood chemistry: Secondary | ICD-10-CM

## 2022-03-18 DIAGNOSIS — R7309 Other abnormal glucose: Secondary | ICD-10-CM

## 2022-03-18 MED ORDER — AZELASTINE HCL 0.05 % OP SOLN
1.0000 [drp] | Freq: Two times a day (BID) | OPHTHALMIC | 12 refills | Status: DC
  Filled 2022-03-18: qty 6, 25d supply, fill #0
  Filled 2022-04-14: qty 6, 25d supply, fill #1

## 2022-03-18 MED ORDER — PREDNISONE 20 MG PO TABS
40.0000 mg | ORAL_TABLET | Freq: Every day | ORAL | 0 refills | Status: AC
  Filled 2022-03-18: qty 14, 7d supply, fill #0

## 2022-03-18 MED ORDER — MONTELUKAST SODIUM 10 MG PO TABS
10.0000 mg | ORAL_TABLET | Freq: Every day | ORAL | 3 refills | Status: DC
  Filled 2022-03-18: qty 30, 30d supply, fill #0
  Filled 2022-04-14: qty 30, 30d supply, fill #1

## 2022-03-18 MED ORDER — AZITHROMYCIN 250 MG PO TABS
ORAL_TABLET | ORAL | 0 refills | Status: AC
  Filled 2022-03-18: qty 6, 5d supply, fill #0

## 2022-03-18 MED ORDER — FLUTICASONE-SALMETEROL 250-50 MCG/ACT IN AEPB
1.0000 | INHALATION_SPRAY | Freq: Two times a day (BID) | RESPIRATORY_TRACT | 1 refills | Status: DC
  Filled 2022-03-18: qty 60, 30d supply, fill #0
  Filled 2022-04-14: qty 60, 30d supply, fill #1
  Filled 2022-05-11: qty 60, 30d supply, fill #2

## 2022-03-18 NOTE — Progress Notes (Signed)
Pt requesting prednisone & cough meds. Pt states he having congestion. Pt also requesting change in eye drop Rx.
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
## 2022-03-19 LAB — CBC WITH DIFFERENTIAL/PLATELET
Basophils Absolute: 0.1 10*3/uL (ref 0.0–0.2)
Basos: 1 %
EOS (ABSOLUTE): 0.5 10*3/uL — ABNORMAL HIGH (ref 0.0–0.4)
Eos: 9 %
Hematocrit: 38.1 % (ref 37.5–51.0)
Hemoglobin: 13.7 g/dL (ref 13.0–17.7)
Immature Grans (Abs): 0 10*3/uL (ref 0.0–0.1)
Immature Granulocytes: 0 %
Lymphocytes Absolute: 1.7 10*3/uL (ref 0.7–3.1)
Lymphs: 32 %
MCH: 33.7 pg — ABNORMAL HIGH (ref 26.6–33.0)
MCHC: 36 g/dL — ABNORMAL HIGH (ref 31.5–35.7)
MCV: 94 fL (ref 79–97)
Monocytes Absolute: 0.6 10*3/uL (ref 0.1–0.9)
Monocytes: 12 %
Neutrophils Absolute: 2.4 10*3/uL (ref 1.4–7.0)
Neutrophils: 46 %
Platelets: 236 10*3/uL (ref 150–450)
RBC: 4.07 x10E6/uL — ABNORMAL LOW (ref 4.14–5.80)
RDW: 11.9 % (ref 11.6–15.4)
WBC: 5.3 10*3/uL (ref 3.4–10.8)

## 2022-03-19 LAB — LIPID PANEL
Chol/HDL Ratio: 2.1 ratio (ref 0.0–5.0)
Cholesterol, Total: 224 mg/dL — ABNORMAL HIGH (ref 100–199)
HDL: 106 mg/dL (ref >39)
LDL Chol Calc (NIH): 107 mg/dL — ABNORMAL HIGH (ref 0–99)
Triglycerides: 64 mg/dL (ref 0–149)
VLDL Cholesterol Cal: 11 mg/dL (ref 5–40)

## 2022-03-19 LAB — HCV INTERPRETATION

## 2022-03-19 LAB — CMP14+EGFR
ALT: 21 IU/L (ref 0–44)
AST: 29 IU/L (ref 0–40)
Albumin/Globulin Ratio: 1.4 (ref 1.2–2.2)
Albumin: 4.1 g/dL (ref 3.8–4.9)
Alkaline Phosphatase: 123 IU/L — ABNORMAL HIGH (ref 44–121)
BUN/Creatinine Ratio: 9 (ref 9–20)
BUN: 10 mg/dL (ref 6–24)
Bilirubin Total: 0.2 mg/dL (ref 0.0–1.2)
CO2: 22 mmol/L (ref 20–29)
Calcium: 9.4 mg/dL (ref 8.7–10.2)
Chloride: 103 mmol/L (ref 96–106)
Creatinine, Ser: 1.17 mg/dL (ref 0.76–1.27)
Globulin, Total: 2.9 g/dL (ref 1.5–4.5)
Glucose: 89 mg/dL (ref 70–99)
Potassium: 3.9 mmol/L (ref 3.5–5.2)
Sodium: 139 mmol/L (ref 134–144)
Total Protein: 7 g/dL (ref 6.0–8.5)
eGFR: 75 mL/min/{1.73_m2} (ref >59)

## 2022-03-19 LAB — HCV AB W REFLEX TO QUANT PCR: HCV Ab: NONREACTIVE

## 2022-03-19 LAB — HEMOGLOBIN A1C
Est. average glucose Bld gHb Est-mCnc: 111 mg/dL
Hgb A1c MFr Bld: 5.5 % (ref 4.8–5.6)

## 2022-04-03 ENCOUNTER — Telehealth: Payer: Self-pay | Admitting: Emergency Medicine

## 2022-04-03 ENCOUNTER — Other Ambulatory Visit: Payer: Self-pay

## 2022-04-03 NOTE — Telephone Encounter (Signed)
Call returned and another order sent.

## 2022-04-03 NOTE — Telephone Encounter (Signed)
Copied from CRM [id number]. Topic: General - Other >> Apr 03, 2022  8:59 AM Esperanza Sheets wrote: Reason for CRM: pt states he received cologuard in a "cpl of weeks ago" and the directions states he was to take it within 2 days of receiving it  Pt inquiring another test can be mailed out

## 2022-04-14 ENCOUNTER — Other Ambulatory Visit: Payer: Self-pay

## 2022-04-14 ENCOUNTER — Other Ambulatory Visit: Payer: Self-pay | Admitting: Family Medicine

## 2022-04-14 DIAGNOSIS — J4520 Mild intermittent asthma, uncomplicated: Secondary | ICD-10-CM

## 2022-04-15 ENCOUNTER — Other Ambulatory Visit: Payer: Self-pay

## 2022-04-15 MED ORDER — ALBUTEROL SULFATE HFA 108 (90 BASE) MCG/ACT IN AERS
2.0000 | INHALATION_SPRAY | Freq: Four times a day (QID) | RESPIRATORY_TRACT | 2 refills | Status: DC | PRN
  Filled 2022-04-15 – 2022-04-21 (×2): qty 6.7, 25d supply, fill #0
  Filled 2022-05-15: qty 6.7, 25d supply, fill #1
  Filled 2022-05-19 – 2022-06-10 (×2): qty 6.7, 25d supply, fill #2

## 2022-04-16 ENCOUNTER — Institutional Professional Consult (permissible substitution): Payer: Commercial Managed Care - HMO | Admitting: Pulmonary Disease

## 2022-04-16 NOTE — Progress Notes (Deleted)
Synopsis: Referred in September 2023 for COPD by Bertram Denver, NP  Subjective:   PATIENT ID: Keith Weber GENDER: male DOB: 16-Sep-1970, MRN: [id number]   HPI  No chief complaint on file.  Diamonte Stavely is a 51 year old male, former smoker with GERD and CKDIII who is referred to pulmonary clinic for COPD evaluation.   Patient was seen in ED 02/15/22 for wheezing where he was treated with prednisone taper and albuterol inhaler. He was seen in follow up by his PCP on 03/18/22 where he was started on advair 250-64mcg 1 puff twice daily and montelukast 10mg  daily along with another steroid taper.  Past Medical History:  Diagnosis Date   Asthma    COPD (chronic obstructive pulmonary disease) (HCC)    GERD (gastroesophageal reflux disease)    Sciatic nerve pain      Family History  Family history unknown: Yes     Social History   Socioeconomic History   Marital status: Single    Spouse name: Not on file   Number of children: Not on file   Years of education: Not on file   Highest education level: Not on file  Occupational History   Not on file  Tobacco Use   Smoking status: Former    Packs/day: 0.00    Years: 31.00    Total pack years: 0.00    Types: Cigars, Cigarettes   Smokeless tobacco: Never  Substance and Sexual Activity   Alcohol use: Yes    Alcohol/week: 1.0 standard drink of alcohol    Types: 1 Cans of beer per week    Comment: occ   Drug use: Yes    Types: Marijuana   Sexual activity: Not on file  Other Topics Concern   Not on file  Social History Narrative   Not on file   Social Determinants of Health   Financial Resource Strain: Not on file  Food Insecurity: Not on file  Transportation Needs: Not on file  Physical Activity: Not on file  Stress: Not on file  Social Connections: Not on file  Intimate Partner Violence: Not on file     Allergies  Allergen Reactions   Aspirin Anaphylaxis   Peanut-Containing Drug Products Anaphylaxis    Shellfish Allergy Anaphylaxis and Shortness Of Breath   Amoxicillin Hives    Has patient had a PCN reaction causing immediate rash, facial/tongue/throat swelling, SOB or lightheadedness with hypotension:  Has patient had a PCN reaction causing severe rash involving mucus membranes or skin necrosis:  Has patient had a PCN reaction that required hospitalization Yes Has patient had a PCN reaction occurring within the last 10 years: If all of the above answers are "NO", then may proceed with Cephalosporin use.    Iodine Nausea And Vomiting and Swelling   Kiwi Extract Swelling   Naproxen Hives   Tramadol     Breathes funny   Latex Itching and Rash     Outpatient Medications Prior to Visit  Medication Sig Dispense Refill   acetaminophen (TYLENOL) 500 MG tablet Take 2 tablets (1,000 mg total) by mouth every 6 (six) hours as needed. 30 tablet 0   albuterol (PROVENTIL HFA) 108 (90 Base) MCG/ACT inhaler Inhale 2 puffs into the lungs every 6 (six) hours as needed for wheezing or shortness of breath. 6.7 g 2   azelastine (OPTIVAR) 0.05 % ophthalmic solution Place 1 drop into both eyes 2 (two) times daily. 6 mL 12   betamethasone dipropionate 0.05 % cream Apply topically  2 (two) times daily. 60 g 1   docusate sodium (COLACE) 100 MG capsule Take 1 capsule (100 mg total) by mouth 2 (two) times daily. 10 capsule 0   fluticasone-salmeterol (ADVAIR) 250-50 MCG/ACT AEPB Inhale 1 puff into the lungs in the morning and at bedtime. 180 each 1   ipratropium-albuterol (DUONEB) 0.5-2.5 (3) MG/3ML SOLN Take 3 mLs by nebulization every 6 (six) hours as needed. 360 mL 6   montelukast (SINGULAIR) 10 MG tablet Take 1 tablet (10 mg total) by mouth at bedtime. 30 tablet 3   Multiple Vitamin (MULTIVITAMIN WITH MINERALS) TABS tablet Take 1 tablet by mouth daily.     omeprazole (PRILOSEC) 20 MG capsule Take 1 capsule (20 mg total) by mouth daily. 90 capsule 0   sodium chloride (OCEAN) 0.65 % SOLN nasal spray Place 1 spray  into both nostrils as needed for congestion.  0   No facility-administered medications prior to visit.   Review of Systems  Constitutional:  Negative for chills, fever, malaise/fatigue and weight loss.  HENT:  Negative for congestion, sinus pain and sore throat.   Eyes: Negative.   Respiratory:  Negative for cough, hemoptysis, sputum production, shortness of breath and wheezing.   Cardiovascular:  Negative for chest pain, palpitations, orthopnea, claudication and leg swelling.  Gastrointestinal:  Negative for abdominal pain, heartburn, nausea and vomiting.  Genitourinary: Negative.   Musculoskeletal:  Negative for joint pain and myalgias.  Skin:  Negative for rash.  Neurological:  Negative for weakness.  Endo/Heme/Allergies: Negative.   Psychiatric/Behavioral: Negative.     Objective:  There were no vitals filed for this visit.   Physical Exam Constitutional:      General: He is not in acute distress. HENT:     Head: Normocephalic and atraumatic.  Eyes:     Extraocular Movements: Extraocular movements intact.     Conjunctiva/sclera: Conjunctivae normal.     Pupils: Pupils are equal, round, and reactive to light.  Cardiovascular:     Rate and Rhythm: Normal rate and regular rhythm.     Pulses: Normal pulses.     Heart sounds: Normal heart sounds. No murmur heard. Abdominal:     General: Bowel sounds are normal.     Palpations: Abdomen is soft.  Musculoskeletal:     Right lower leg: No edema.     Left lower leg: No edema.  Lymphadenopathy:     Cervical: No cervical adenopathy.  Skin:    General: Skin is warm and dry.  Neurological:     General: No focal deficit present.     Mental Status: He is alert.  Psychiatric:        Mood and Affect: Mood normal.        Behavior: Behavior normal.        Thought Content: Thought content normal.        Judgment: Judgment normal.    CBC    Component Value Date/Time   WBC 5.3 03/18/2022 1639   WBC 9.1 08/09/2020 2000   RBC  4.07 (L) 03/18/2022 1639   RBC 4.33 08/09/2020 2000   HGB 13.7 03/18/2022 1639   HCT 38.1 03/18/2022 1639   PLT 236 03/18/2022 1639   MCV 94 03/18/2022 1639   MCH 33.7 (H) 03/18/2022 1639   MCH 34.4 (H) 08/09/2020 2000   MCHC 36.0 (H) 03/18/2022 1639   MCHC 35.3 08/09/2020 2000   RDW 11.9 03/18/2022 1639   LYMPHSABS 1.7 03/18/2022 1639   MONOABS 1.1 (H) 12/12/2018 1450  EOSABS 0.5 (H) 03/18/2022 1639   BASOSABS 0.1 03/18/2022 1639      Latest Ref Rng & Units 03/18/2022    4:39 PM 08/09/2020    8:00 PM 09/13/2019   10:15 AM  BMP  Glucose 70 - 99 mg/dL 89  91  010   BUN 6 - 24 mg/dL 10  12  13    Creatinine 0.76 - 1.27 mg/dL  2.72  5.36   BUN/Creat Ratio 9 - 20 9     Sodium 134 - 144 mmol/L 139  137  138   Potassium 3.5 - 5.2 mmol/L 3.9  4.2  4.2   Chloride 96 - 106 mmol/L 103  102  101   CO2 20 - 29 mmol/L 22  24  28    Calcium 8.7 - 10.2 mg/dL 9.4  9.3  8.9    Chest imaging: CXR 02/15/22 Chronic interstitial coarsening and generous lung volumes. There is history of COPD. There is no edema, consolidation, effusion, or pneumothorax. Normal heart size and mediastinal contours. Remote rib fractures which are healed.  PFT:     No data to display          Labs:  Path:  Echo:  Heart Catheterization:  Assessment & Plan:   No diagnosis found.  Discussion: ***    Current Outpatient Medications:    acetaminophen (TYLENOL) 500 MG tablet, Take 2 tablets (1,000 mg total) by mouth every 6 (six) hours as needed., Disp: 30 tablet, Rfl: 0   albuterol (PROVENTIL HFA) 108 (90 Base) MCG/ACT inhaler, Inhale 2 puffs into the lungs every 6 (six) hours as needed for wheezing or shortness of breath., Disp: 6.7 g, Rfl: 2   azelastine (OPTIVAR) 0.05 % ophthalmic solution, Place 1 drop into both eyes 2 (two) times daily., Disp: 6 mL, Rfl: 12   betamethasone dipropionate 0.05 % cream, Apply topically 2 (two) times daily., Disp: 60 g, Rfl: 1   docusate sodium (COLACE) 100 MG  capsule, Take 1 capsule (100 mg total) by mouth 2 (two) times daily., Disp: 10 capsule, Rfl: 0   fluticasone-salmeterol (ADVAIR) 250-50 MCG/ACT AEPB, Inhale 1 puff into the lungs in the morning and at bedtime., Disp: 180 each, Rfl: 1   ipratropium-albuterol (DUONEB) 0.5-2.5 (3) MG/3ML SOLN, Take 3 mLs by nebulization every 6 (six) hours as needed., Disp: 360 mL, Rfl: 6   montelukast (SINGULAIR) 10 MG tablet, Take 1 tablet (10 mg total) by mouth at bedtime., Disp: 30 tablet, Rfl: 3   Multiple Vitamin (MULTIVITAMIN WITH MINERALS) TABS tablet, Take 1 tablet by mouth daily., Disp: , Rfl:    omeprazole (PRILOSEC) 20 MG capsule, Take 1 capsule (20 mg total) by mouth daily., Disp: 90 capsule, Rfl: 0   sodium chloride (OCEAN) 0.65 % SOLN nasal spray, Place 1 spray into both nostrils as needed for congestion., Disp:  , Rfl: 0

## 2022-04-21 ENCOUNTER — Other Ambulatory Visit: Payer: Self-pay

## 2022-04-21 ENCOUNTER — Other Ambulatory Visit (HOSPITAL_COMMUNITY): Payer: Self-pay

## 2022-04-22 ENCOUNTER — Other Ambulatory Visit (HOSPITAL_COMMUNITY): Payer: Self-pay

## 2022-04-29 ENCOUNTER — Ambulatory Visit: Payer: Self-pay

## 2022-04-29 ENCOUNTER — Telehealth: Payer: Commercial Managed Care - HMO | Admitting: Physician Assistant

## 2022-04-29 ENCOUNTER — Other Ambulatory Visit: Payer: Self-pay

## 2022-04-29 DIAGNOSIS — K047 Periapical abscess without sinus: Secondary | ICD-10-CM

## 2022-04-29 MED ORDER — IBUPROFEN 600 MG PO TABS: 600.0000 mg | ORAL_TABLET | Freq: Three times a day (TID) | ORAL | 0 refills | Status: DC | PRN

## 2022-04-29 MED ORDER — MOXIFLOXACIN HCL 400 MG PO TABS: 400.0000 mg | ORAL_TABLET | Freq: Every day | ORAL | 0 refills | Status: DC

## 2022-04-29 NOTE — Telephone Encounter (Signed)
noted 

## 2022-04-29 NOTE — Telephone Encounter (Signed)
  Chief Complaint: toothache Symptoms: tooth pain on LL area, 8.5/10, jaw pain  Frequency: on and off for 1 year Pertinent Negatives: Patient denies swelling Disposition: [] ED /[] Urgent Care (no appt availability in office) / [] Appointment(In office/virtual)/ [x]  [facility]/ [] Home Care/ [] Refused Recommended Disposition /[]  Mobile Bus/ []  Follow-up with PCP Additional Notes: pt has called dentist but too expensive and hasn't been able to call around to other areas. Pt asking for abx to help with possible tooth infection. Advised pt no appts available and offered virtual UC appt. Pt agreed and scheduled for 1800 today.   Summary: Tooth pain/Possible infection   Patient has been having tooth pain. He says a filing fell out and he feels like he may have an infection. He is wondering if an antibiotic can be called into his pharmacy at   [place]  Phone: [phone]  Fax: [phone]      Reason for Disposition  Toothache present > 24 hours  Answer Assessment - Initial Assessment Questions 1. LOCATION: "Which tooth is hurting?"  (e.g., right-side/left-side, upper/lower, front/back)     Lower left  2. ONSET: "When did the toothache start?"  (e.g., hours, days)      1 year  3. SEVERITY: "How bad is the toothache?"  (Scale 1-10; mild, moderate or severe)   - MILD (1-3): doesn't interfere with chewing    - MODERATE (4-7): interferes with chewing, interferes with normal activities, awakens from sleep     - SEVERE (8-10): unable to eat, unable to do any normal activities, excruciating pain        8.5 4. SWELLING: "Is there any visible swelling of your face?"     no 5. OTHER SYMPTOMS: "Do you have any other symptoms?" (e.g., fever)     Jaw pain  Protocols used: Toothache-A-AH

## 2022-04-29 NOTE — Patient Instructions (Signed)
[name], thank you for joining Mar Daring, PA-C for today's virtual visit.  While this provider is not your primary care provider (PCP), if your PCP is located in our provider database this encounter information will be shared with them immediately following your visit.  Consent: (Patient) Keith Weber provided verbal consent for this virtual visit at the beginning of the encounter.  Current Medications:  Current Outpatient Medications:    ibuprofen (ADVIL) 600 MG tablet, Take 1 tablet (600 mg total) by mouth every 8 (eight) hours as needed., Disp: 30 tablet, Rfl: 0   moxifloxacin (AVELOX) 400 MG tablet, Take 1 tablet (400 mg total) by mouth daily for 7 days., Disp: 7 tablet, Rfl: 0   acetaminophen (TYLENOL) 500 MG tablet, Take 2 tablets (1,000 mg total) by mouth every 6 (six) hours as needed., Disp: 30 tablet, Rfl: 0   albuterol (PROVENTIL HFA) 108 (90 Base) MCG/ACT inhaler, Inhale 2 puffs into the lungs every 6 (six) hours as needed for wheezing or shortness of breath., Disp: 6.7 g, Rfl: 2   azelastine (OPTIVAR) 0.05 % ophthalmic solution, Place 1 drop into both eyes 2 (two) times daily., Disp: 6 mL, Rfl: 12   betamethasone dipropionate 0.05 % cream, Apply topically 2 (two) times daily., Disp: 60 g, Rfl: 1   docusate sodium (COLACE) 100 MG capsule, Take 1 capsule (100 mg total) by mouth 2 (two) times daily., Disp: 10 capsule, Rfl: 0   fluticasone-salmeterol (ADVAIR) 250-50 MCG/ACT AEPB, Inhale 1 puff into the lungs in the morning and at bedtime., Disp: 180 each, Rfl: 1   ipratropium-albuterol (DUONEB) 0.5-2.5 (3) MG/3ML SOLN, Take 3 mLs by nebulization every 6 (six) hours as needed., Disp: 360 mL, Rfl: 6   montelukast (SINGULAIR) 10 MG tablet, Take 1 tablet (10 mg total) by mouth at bedtime., Disp: 30 tablet, Rfl: 3   Multiple Vitamin (MULTIVITAMIN WITH MINERALS) TABS tablet, Take 1 tablet by mouth daily., Disp: , Rfl:    omeprazole (PRILOSEC) 20 MG capsule, Take 1 capsule  (20 mg total) by mouth daily., Disp: 90 capsule, Rfl: 0   sodium chloride (OCEAN) 0.65 % SOLN nasal spray, Place 1 spray into both nostrils as needed for congestion., Disp:  , Rfl: 0   Medications ordered in this encounter:  Meds ordered this encounter  Medications   moxifloxacin (AVELOX) 400 MG tablet    Sig: Take 1 tablet (400 mg total) by mouth daily for 7 days.    Dispense:  7 tablet    Refill:  0    Order Specific Question:   Supervising Provider    Answer:   Chase Picket [id number]   ibuprofen (ADVIL) 600 MG tablet    Sig: Take 1 tablet (600 mg total) by mouth every 8 (eight) hours as needed.    Dispense:  30 tablet    Refill:  0    Order Specific Question:   Supervising Provider    Answer:   Chase Picket [id number]     *If you need refills on other medications prior to your next appointment, please contact your pharmacy*  Follow-Up: Call back or seek an in-person evaluation if the symptoms worsen or if the condition fails to improve as anticipated.  Other Instructions  [facility] [place], [place], [place] [address].For appointments and more information, call [phone].    If you have been instructed to have an in-person evaluation today at a local Urgent Care facility, please use the link below. It  will take you to a list of all of our available [facility], including address, phone number and hours of operation. Please do not delay care.  [facility]  If you or a family member do not have a primary care provider, use the link below to schedule a visit and establish care. When you choose a [facility] primary care physician or advanced practice provider, you gain a long-term partner in health. Find a Primary Care Provider  Learn more about [facility]'s in-office and virtual care options: [facility] - Get Care Now

## 2022-04-29 NOTE — Progress Notes (Signed)
Virtual Visit Consent   Keith Weber, you are scheduled for a virtual visit with a [facility] provider today. Just as with appointments in the office, your consent must be obtained to participate. Your consent will be active for this visit and any virtual visit you may have with one of our providers in the next 365 days. If you have a MyChart account, a copy of this consent can be sent to you electronically.  As this is a virtual visit, video technology does not allow for your provider to perform a traditional examination. This may limit your provider's ability to fully assess your condition. If your provider identifies any concerns that need to be evaluated in person or the need to arrange testing (such as labs, EKG, etc.), we will make arrangements to do so. Although advances in technology are sophisticated, we cannot ensure that it will always work on either your end or our end. If the connection with a video visit is poor, the visit may have to be switched to a telephone visit. With either a video or telephone visit, we are not always able to ensure that we have a secure connection.  By engaging in this virtual visit, you consent to the provision of healthcare and authorize for your insurance to be billed (if applicable) for the services provided during this visit. Depending on your insurance coverage, you may receive a charge related to this service.  I need to obtain your verbal consent now. Are you willing to proceed with your visit today? Keith Weber has provided verbal consent on 04/29/2022 for a virtual visit (video or telephone). Keith Daring, PA-C  Date: 04/29/2022 6:14 PM  Virtual Visit via Video Note   I, Keith Weber, connected with  Keith Weber  ([id number], [birth year]) on 04/29/22 at  6:00 PM EDT by a video-enabled telemedicine application and verified that I am speaking with the correct person using two identifiers.  Location: Patient: Virtual Visit Location  Patient: Home Provider: Virtual Visit Location Provider: Home Office   I discussed the limitations of evaluation and management by telemedicine and the availability of in person appointments. The patient expressed understanding and agreed to proceed.    History of Present Illness: Keith Weber is a 51 y.o. who identifies as a male who was assigned male at birth, and is being seen today for dental infection.  HPI: Dental Pain  This is a new problem. The current episode started yesterday. The problem occurs constantly. The problem has been gradually worsening. The pain is moderate. Associated symptoms include facial pain, a fever (subjective fevers) and thermal sensitivity. Pertinent negatives include no sinus pressure. He has tried NSAIDs (BC powder) for the symptoms. The treatment provided no relief.     Problems:  Patient Active Problem List   Diagnosis Date Noted   Pneumothorax on right 09/09/2019   CAP (community acquired pneumonia) 01/21/2016   Right middle lobe pneumonia 01/21/2016   Glucosuria with normal serum glucose 01/21/2016   Lactic acidosis 01/21/2016   CKD (chronic kidney disease) stage 3, GFR 30-59 ml/min ([facility]) 01/21/2016   Cigarette smoker 06/12/2015   COPD pfts pending/ still smoking  06/11/2015   Asthma, chronic 10/16/2014   Closed fracture of right thumb 06/16/2014    Allergies:  Allergies  Allergen Reactions   Aspirin Anaphylaxis   Peanut-Containing Drug Products Anaphylaxis   Shellfish Allergy Anaphylaxis and Shortness Of Breath   Amoxicillin Hives    Has patient had a PCN reaction causing immediate rash, facial/tongue/throat  swelling, SOB or lightheadedness with hypotension:  Has patient had a PCN reaction causing severe rash involving mucus membranes or skin necrosis:  Has patient had a PCN reaction that required hospitalization Yes Has patient had a PCN reaction occurring within the last 10 years: If all of the above answers are "NO", then may proceed  with Cephalosporin use.    Clindamycin/Lincomycin Other (See Comments)    Chest tightness   Iodine Nausea And Vomiting and Swelling   Kiwi Extract Swelling   Naproxen Hives   Tramadol     Breathes funny   Latex Itching and Rash   Medications:  Current Outpatient Medications:    ibuprofen (ADVIL) 600 MG tablet, Take 1 tablet (600 mg total) by mouth every 8 (eight) hours as needed., Disp: 30 tablet, Rfl: 0   moxifloxacin (AVELOX) 400 MG tablet, Take 1 tablet (400 mg total) by mouth daily for 7 days., Disp: 7 tablet, Rfl: 0   acetaminophen (TYLENOL) 500 MG tablet, Take 2 tablets (1,000 mg total) by mouth every 6 (six) hours as needed., Disp: 30 tablet, Rfl: 0   albuterol (PROVENTIL HFA) 108 (90 Base) MCG/ACT inhaler, Inhale 2 puffs into the lungs every 6 (six) hours as needed for wheezing or shortness of breath., Disp: 6.7 g, Rfl: 2   azelastine (OPTIVAR) 0.05 % ophthalmic solution, Place 1 drop into both eyes 2 (two) times daily., Disp: 6 mL, Rfl: 12   betamethasone dipropionate 0.05 % cream, Apply topically 2 (two) times daily., Disp: 60 g, Rfl: 1   docusate sodium (COLACE) 100 MG capsule, Take 1 capsule (100 mg total) by mouth 2 (two) times daily., Disp: 10 capsule, Rfl: 0   fluticasone-salmeterol (ADVAIR) 250-50 MCG/ACT AEPB, Inhale 1 puff into the lungs in the morning and at bedtime., Disp: 180 each, Rfl: 1   ipratropium-albuterol (DUONEB) 0.5-2.5 (3) MG/3ML SOLN, Take 3 mLs by nebulization every 6 (six) hours as needed., Disp: 360 mL, Rfl: 6   montelukast (SINGULAIR) 10 MG tablet, Take 1 tablet (10 mg total) by mouth at bedtime., Disp: 30 tablet, Rfl: 3   Multiple Vitamin (MULTIVITAMIN WITH MINERALS) TABS tablet, Take 1 tablet by mouth daily., Disp: , Rfl:    omeprazole (PRILOSEC) 20 MG capsule, Take 1 capsule (20 mg total) by mouth daily., Disp: 90 capsule, Rfl: 0   sodium chloride (OCEAN) 0.65 % SOLN nasal spray, Place 1 spray into both nostrils as needed for congestion., Disp:  , Rfl:  0  Observations/Objective: Patient is well-developed, well-nourished in no acute distress.  Resting comfortably at home.  Head is normocephalic, atraumatic.  No labored breathing.  Speech is clear and coherent with logical content.  Patient is alert and oriented at baseline.    Assessment and Plan: 1. Dental infection - moxifloxacin (AVELOX) 400 MG tablet; Take 1 tablet (400 mg total) by mouth daily for 7 days.  Dispense: 7 tablet; Refill: 0 - ibuprofen (ADVIL) 600 MG tablet; Take 1 tablet (600 mg total) by mouth every 8 (eight) hours as needed.  Dispense: 30 tablet; Refill: 0  - Suspected infection with broken tooth - Avelox (given due to multiple allergies) and ibuprofen prescribed - Can use ice on outside jaw/cheek for swelling - Can also take tylenol for pain with other medications - Discussed DenTemp putty that can be used to cover a broken tooth - Schedule a follow with a dentist as soon as possible (Can contact [facility] or [facility] [[phone]] associated with [facility]  if underinsured or uninsured) - Seek in person evaluation if symptoms fail to improve or if they worsen   Follow Up Instructions: I discussed the assessment and treatment plan with the patient. The patient was provided an opportunity to ask questions and all were answered. The patient agreed with the plan and demonstrated an understanding of the instructions.  A copy of instructions were sent to the patient via MyChart unless otherwise noted below.    The patient was advised to call back or seek an in-person evaluation if the symptoms worsen or if the condition fails to improve as anticipated.  Time:  I spent 10 minutes with the patient via telehealth technology discussing the above problems/concerns.    Keith Daring, PA-C

## 2022-04-29 NOTE — Telephone Encounter (Signed)
Routing to CMA 

## 2022-05-01 ENCOUNTER — Telehealth: Payer: Self-pay | Admitting: Nurse Practitioner

## 2022-05-01 ENCOUNTER — Other Ambulatory Visit: Payer: Self-pay | Admitting: Nurse Practitioner

## 2022-05-01 DIAGNOSIS — K089 Disorder of teeth and supporting structures, unspecified: Secondary | ICD-10-CM

## 2022-05-01 DIAGNOSIS — K047 Periapical abscess without sinus: Secondary | ICD-10-CM

## 2022-05-01 NOTE — Telephone Encounter (Signed)
Routing to CMA 

## 2022-05-01 NOTE — Telephone Encounter (Signed)
Please advise 

## 2022-05-01 NOTE — Telephone Encounter (Signed)
Dental referral placed. I do not prescribe pain meds for dental concerns.

## 2022-05-01 NOTE — Telephone Encounter (Signed)
Pt is calling in to request a referral to a dentist due to a toothache. Pt says that he has [employer]. Pt completed a virtual visit with Fenton Malling.       Pt would also like to know if provider could sent in something stronger than IBuprofen 600mg  pt says that it isn't helping with the pain.     Pharmacy:  [facility] Phone:  [phone]  Fax:  [phone]      [phone]

## 2022-05-03 ENCOUNTER — Ambulatory Visit (HOSPITAL_COMMUNITY)
Admission: EM | Admit: 2022-05-03 | Discharge: 2022-05-03 | Disposition: A | Discharge status: Home / Self Care | Payer: Commercial Managed Care - HMO | Attending: Emergency Medicine | Admitting: Emergency Medicine

## 2022-05-03 ENCOUNTER — Encounter (HOSPITAL_COMMUNITY): Payer: Self-pay | Admitting: *Deleted

## 2022-05-03 DIAGNOSIS — K047 Periapical abscess without sinus: Secondary | ICD-10-CM

## 2022-05-03 DIAGNOSIS — K029 Dental caries, unspecified: Secondary | ICD-10-CM

## 2022-05-03 DIAGNOSIS — S025XXA Fracture of tooth (traumatic), initial encounter for closed fracture: Secondary | ICD-10-CM | POA: Diagnosis not present

## 2022-05-03 DIAGNOSIS — K0889 Other specified disorders of teeth and supporting structures: Secondary | ICD-10-CM

## 2022-05-03 HISTORY — DX: Unspecified osteoarthritis, unspecified site: M19.90

## 2022-05-03 MED ORDER — IBUPROFEN 800 MG PO TABS
800.0000 mg | ORAL_TABLET | Freq: Three times a day (TID) | ORAL | 0 refills | Status: DC | PRN
  Filled 2022-05-03: qty 20, 7d supply, fill #0

## 2022-05-03 MED ORDER — IBUPROFEN 800 MG PO TABS: 800.0000 mg | ORAL_TABLET | Freq: Three times a day (TID) | ORAL | 0 refills | Status: AC | PRN

## 2022-05-03 MED ORDER — LIDOCAINE VISCOUS HCL 2 % MT SOLN
15.0000 mL | Freq: Once | OROMUCOSAL | Status: AC
  Administered 2022-05-03: 15 mL via OROMUCOSAL

## 2022-05-03 MED ORDER — CEFDINIR 300 MG PO CAPS: 300.0000 mg | ORAL_CAPSULE | Freq: Two times a day (BID) | ORAL | 0 refills | Status: AC

## 2022-05-03 MED ORDER — LIDOCAINE VISCOUS HCL 2 % MT SOLN: 15.0000 mL | OROMUCOSAL | 0 refills | Status: AC | PRN

## 2022-05-03 MED ORDER — LIDOCAINE VISCOUS HCL 2 % MT SOLN
OROMUCOSAL | Status: AC
  Filled 2022-05-03: qty 15

## 2022-05-03 MED ORDER — FLUTICASONE PROPIONATE 50 MCG/ACT NA SUSP
1.0000 | Freq: Every day | NASAL | 2 refills | Status: DC
  Filled 2022-05-03: qty 16, 30d supply, fill #0
  Filled 2022-05-15 – 2022-06-10 (×3): qty 16, 30d supply, fill #1

## 2022-05-03 MED ORDER — LIDOCAINE VISCOUS HCL 2 % MT SOLN
15.0000 mL | OROMUCOSAL | 0 refills | Status: DC | PRN
  Filled 2022-05-03: qty 100, fill #0

## 2022-05-03 MED ORDER — CEFDINIR 300 MG PO CAPS
300.0000 mg | ORAL_CAPSULE | Freq: Two times a day (BID) | ORAL | 0 refills | Status: DC
  Filled 2022-05-03: qty 14, 7d supply, fill #0

## 2022-05-03 NOTE — ED Provider Notes (Signed)
[place]    CSN: [id number] Arrival date & time: 05/03/22  1124      History   Chief Complaint Chief Complaint  Patient presents with   Dental Pain    HPI Brevon Dewald is a 51 y.o. male.  Presents with 4-day history of left sided dental pain, top and bottom Reports lots of pain, especially with chewing on that side No fevers, chills, swelling, drainage Has not seen a dentist in many years  Saw primary care 4 days ago, was given moxifloxacin, 600 mg ibuprofen, temperin repair.  Was recommended to follow-up with dentist but he was unable to find one to see Reports none of these have helped his symptoms  Additionally reports some nasal congestion with seasonal changes  Past Medical History:  Diagnosis Date   Arthritis    Asthma    COPD (chronic obstructive pulmonary disease) (HCC)    GERD (gastroesophageal reflux disease)    Sciatic nerve pain     Patient Active Problem List   Diagnosis Date Noted   Pneumothorax on right 09/09/2019   CAP (community acquired pneumonia) 01/21/2016   Right middle lobe pneumonia 01/21/2016   Glucosuria with normal serum glucose 01/21/2016   Lactic acidosis 01/21/2016   CKD (chronic kidney disease) stage 3, GFR 30-59 ml/min ([place]) 01/21/2016   Cigarette smoker 06/12/2015   COPD pfts pending/ still smoking  06/11/2015   Asthma, chronic 10/16/2014   Closed fracture of right thumb 06/16/2014    Past Surgical History:  Procedure Laterality Date   I & D EXTREMITY Right 06/16/2014   Procedure: IRRIGATION AND DEBRIDEMENT RIGHT THUMB AND FIRST FINGER AND PINNING ;  Surgeon: Roseanne Kaufman, MD;  Location: [facility];  Service: Orthopedics;  Laterality: Right;   IRRIGATION AND DEBRIDEMENT KNEE Right 06/16/2014   Procedure: IRRIGATION AND DEBRIDEMENT POSTERIOR KNEE;  Surgeon: Roseanne Kaufman, MD;  Location: [facility];  Service: Orthopedics;  Laterality: Right;   LACERATION REPAIR Right 06/16/2014   Procedure: REPAIR ODF THREE LACERATIONS  POSTERIOR KNEE;  Surgeon: Roseanne Kaufman, MD;  Location: [facility];  Service: Orthopedics;  Laterality: Right;       Home Medications    Prior to Admission medications   Medication Sig Start Date End Date Taking? Authorizing Provider  cefdinir (OMNICEF) 300 MG capsule Take 1 capsule (300 mg total) by mouth 2 (two) times daily for 7 days. 05/03/22 05/10/22 Yes Awad Gladd, Wells Guiles, PA-C  fluticasone (FLONASE) 50 MCG/ACT nasal spray Place 1 spray into both nostrils daily for 5 days. 05/03/22 05/08/22 Yes Swannie Milius, Wells Guiles, PA-C  fluticasone-salmeterol (ADVAIR) 250-50 MCG/ACT AEPB Inhale 1 puff into the lungs in the morning and at bedtime. 03/18/22  Yes Gildardo Pounds, NP  lidocaine (XYLOCAINE) 2 % solution Use as directed 15 mLs in the mouth or throat as needed for mouth pain. 05/03/22  Yes Serena Petterson, Wells Guiles, PA-C  omeprazole (PRILOSEC) 20 MG capsule Take 1 capsule (20 mg total) by mouth daily. 03/13/22  Yes Gildardo Pounds, NP  acetaminophen (TYLENOL) 500 MG tablet Take 2 tablets (1,000 mg total) by mouth every 6 (six) hours as needed. 09/14/19   Meuth, Brooke A, PA-C  albuterol (PROVENTIL HFA) 108 (90 Base) MCG/ACT inhaler Inhale 2 puffs into the lungs every 6 (six) hours as needed for wheezing or shortness of breath. 04/15/22   Charlott Rakes, MD  betamethasone dipropionate 0.05 % cream Apply topically 2 (two) times daily. 03/07/20   Gildardo Pounds, NP  docusate sodium (COLACE) 100 MG capsule Take 1 capsule (  100 mg total) by mouth 2 (two) times daily. 09/14/19   Meuth, Brooke A, PA-C  ibuprofen (ADVIL) 800 MG tablet Take 1 tablet (800 mg total) by mouth every 8 (eight) hours as needed. 05/03/22   Paislee Szatkowski, Wells Guiles, PA-C  ipratropium-albuterol (DUONEB) 0.5-2.5 (3) MG/3ML SOLN Take 3 mLs by nebulization every 6 (six) hours as needed. 08/13/21 04/30/22  Gildardo Pounds, NP  Multiple Vitamin (MULTIVITAMIN WITH MINERALS) TABS tablet Take 1 tablet by mouth daily.    [provider]  sodium chloride (OCEAN) 0.65 % SOLN  nasal spray Place 1 spray into both nostrils as needed for congestion. 09/14/19   Meuth, Blaine Hamper, PA-C    Family History Family History  Family history unknown: Yes    Social History Social History   Tobacco Use   Smoking status: Former    Packs/day: 0.00    Years: 31.00    Total pack years: 0.00    Types: Cigarettes   Smokeless tobacco: Never  Vaping Use   Vaping Use: Never used  Substance Use Topics   Alcohol use: Not Currently    Comment: occasionally   Drug use: Not Currently    Types: Marijuana     Allergies   Aspirin, Peanut-containing drug products, Shellfish allergy, Amoxicillin, Clindamycin/lincomycin, Iodine, Kiwi extract, Naproxen, Tramadol, and Latex   Review of Systems Review of Systems Per HPI  Physical Exam Triage Vital Signs ED Triage Vitals [05/03/22 1144]  Enc Vitals Group     BP (!) 165/110     Pulse Rate 70     Resp 16     Temp 98.7 F (37.1 C)     Temp Source Oral     SpO2 100 %     Weight      Height      Head Circumference      Peak Flow      Pain Score 7     Pain Loc      Pain Edu?      Excl. in [place]?    No data found.  Updated Vital Signs BP (!) 145/105   Pulse 70   Temp 98.7 F (37.1 C) (Oral)   Resp 16   SpO2 100%    Physical Exam Vitals and nursing note reviewed.  Constitutional:      General: He is not in acute distress. HENT:     Right Ear: Tympanic membrane and ear canal normal.     Left Ear: Tympanic membrane and ear canal normal.     Nose: No congestion.     Mouth/Throat:     Mouth: Mucous membranes are moist. No oral lesions.     Dentition: Abnormal dentition. Dental tenderness, gingival swelling and dental caries present.     Tongue: No lesions.     Pharynx: Oropharynx is clear.     Comments: Left side, top and bottom with missing teeth, gum swelling, cracked teeth, dental tenderness. No abscess noted, no facial swelling Cardiovascular:     Rate and Rhythm: Normal rate and regular rhythm.     Pulses:  Normal pulses.     Heart sounds: Normal heart sounds.  Pulmonary:     Effort: Pulmonary effort is normal.     Breath sounds: Normal breath sounds.  Musculoskeletal:        General: Normal range of motion.  Neurological:     Mental Status: He is alert and oriented to person, place, and time.     UC Treatments / Results  Labs (all labs ordered are listed, but only abnormal results are displayed) Labs Reviewed - No data to display  EKG   Radiology No results found.  Procedures Procedures (including critical care time)  Medications Ordered in UC Medications  lidocaine (XYLOCAINE) 2 % viscous mouth solution 15 mL (15 mLs Mouth/Throat Given 05/03/22 1303)    Initial Impression / Assessment and Plan / UC Course  I have reviewed the triage vital signs and the nursing notes.  Pertinent labs & imaging results that were available during my care of the patient were reviewed by me and considered in my medical decision making (see chart for details).  Dental pain could be from gum infection versus infection under the tooth Abnormal dentition with some cracked teeth At this time recommend Omnicef twice daily for the next 7 days, discussed discontinuing the Moxifloxacin Increase ibuprofen to 800 mg. We discussed using Tylenol intermittently with ibuprofen for additional pain relief Sent viscous lidocaine swish and spit to use as needed, swish given in clinic with improvement. Sent flonase for nasal congestion per patient request  Provided dental resources list, recommend calling tomorrow Work note provided Return precautions discussed. Patient agrees to plan  Final Clinical Impressions(s) / UC Diagnoses   Final diagnoses:  Pain, dental  Dental caries  Closed fracture of tooth, initial encounter  Dental abscess     Discharge Instructions      I recommend using the lidocaine swish and spit as needed to help with mouth pain.  Take the Omnicef (antibiotic) twice daily for the  next 7 days. Stop using the other antibiotic prescribed by your primary care doctor.  You can take the ibuprofen every 6-8 hours as needed.  You can also try tylenol, no more than 1,000 mg every 4 hours for pain.  Please follow up with a dentist as soon as you are able. There are some dental resources attached.  The flonase can be used once daily for nasal congestion.    ED Prescriptions     Medication Sig Dispense Auth. Provider   cefdinir (OMNICEF) 300 MG capsule Take 1 capsule (300 mg total) by mouth 2 (two) times daily for 7 days. 14 capsule Zaiah Credeur, PA-C   lidocaine (XYLOCAINE) 2 % solution Use as directed 15 mLs in the mouth or throat as needed for mouth pain. 100 mL Pieper Kasik, PA-C   ibuprofen (ADVIL) 800 MG tablet Take 1 tablet (800 mg total) by mouth every 8 (eight) hours as needed. 20 tablet Mikhail Hallenbeck, PA-C   fluticasone (FLONASE) 50 MCG/ACT nasal spray Place 1 spray into both nostrils daily for 5 days. 9.9 mL Cheris Tweten, Wells Guiles, PA-C      PDMP not reviewed this encounter.   Omaree Fuqua, Wells Guiles, [place] 05/03/22 1410

## 2022-05-03 NOTE — ED Triage Notes (Signed)
C/O left upper and constant lower dental pain onset 3-4 days ago; saw PCP 04/29/22 for same - was placed on moxifloxacin (was supposed to take 1 daily, but has been doubling the dose, and only has 1 dose left) and given Rx for 673m IBU. Reports no improvement. Has also been using Temparin repair kit and also taking Tyl.

## 2022-05-03 NOTE — Discharge Instructions (Addendum)
I recommend using the lidocaine swish and spit as needed to help with mouth pain.  Take the Omnicef (antibiotic) twice daily for the next 7 days. Stop using the other antibiotic prescribed by your primary care doctor.  You can take the ibuprofen every 6-8 hours as needed.  You can also try tylenol, no more than 1,000 mg every 4 hours for pain.  Please follow up with a dentist as soon as you are able. There are some dental resources attached.  The flonase can be used once daily for nasal congestion.

## 2022-05-04 ENCOUNTER — Other Ambulatory Visit: Payer: Self-pay

## 2022-05-08 ENCOUNTER — Other Ambulatory Visit: Payer: Self-pay

## 2022-05-11 ENCOUNTER — Other Ambulatory Visit (HOSPITAL_COMMUNITY): Payer: Self-pay

## 2022-05-11 ENCOUNTER — Other Ambulatory Visit: Payer: Self-pay

## 2022-05-14 ENCOUNTER — Ambulatory Visit: Payer: Commercial Managed Care - HMO | Admitting: Pulmonary Disease

## 2022-05-14 ENCOUNTER — Other Ambulatory Visit (HOSPITAL_COMMUNITY): Payer: Self-pay

## 2022-05-14 ENCOUNTER — Encounter: Payer: Self-pay | Admitting: Pulmonary Disease

## 2022-05-14 ENCOUNTER — Other Ambulatory Visit: Payer: Self-pay

## 2022-05-14 ENCOUNTER — Telehealth: Payer: Self-pay

## 2022-05-14 VITALS — BP 126/76 | HR 64 | Temp 98.4°F | Ht 69.0 in | Wt 155.6 lb

## 2022-05-14 DIAGNOSIS — R0609 Other forms of dyspnea: Secondary | ICD-10-CM

## 2022-05-14 DIAGNOSIS — J4540 Moderate persistent asthma, uncomplicated: Secondary | ICD-10-CM

## 2022-05-14 MED ORDER — BREZTRI AEROSPHERE 160-9-4.8 MCG/ACT IN AERO
2.0000 | INHALATION_SPRAY | Freq: Two times a day (BID) | RESPIRATORY_TRACT | 11 refills | Status: DC
  Filled 2022-05-14 – 2022-07-05 (×6): qty 10.7, 30d supply, fill #0
  Filled 2022-08-05: qty 10.7, 30d supply, fill #1
  Filled 2022-08-27 – 2022-09-02 (×2): qty 10.7, 30d supply, fill #2
  Filled 2022-10-29 – 2022-12-23 (×4): qty 10.7, 30d supply, fill #3
  Filled 2023-01-19 – 2023-01-27 (×3): qty 10.7, 30d supply, fill #4
  Filled 2023-01-27 – 2023-02-12 (×4): qty 10.7, 30d supply, fill #0
  Filled 2023-03-15 (×2): qty 10.7, 30d supply, fill #1
  Filled 2023-04-23: qty 10.7, 30d supply, fill #2

## 2022-05-14 NOTE — Patient Instructions (Addendum)
Nice to meet you  Use a new inhaler Breztri 2 puffs twice a day - rinse your mouth with water after every use  Use albuterol inhaler or your nebulizer machine AS NEEDED  We will get Pulmonary function tests to help define how well your lungs are working and if COPD is present or not  Call [phone] and ask for nicotine patches the 14 dose and lozenges and gum for breakthrough.  You can get over the counter as well.  Return 3 months or sooner as needed

## 2022-05-14 NOTE — Telephone Encounter (Signed)
Yes - please proceed with PA for [facility] - he has failed breo, advair, symbicort due to uncontrolled emphysema exacerbations requiring repeated prednisone therapy

## 2022-05-14 NOTE — Progress Notes (Signed)
@Patient  ID: Keith Weber, male    DOB: 17-Jun-1971, 51 y.o.   MRN: [id number]  Chief Complaint  Patient presents with   Consult    Consult for copd. Pt states that he has had asthma since he was little. Pt saw wert in 2016. Chest xray done 02/15/22. Pt is on Advair, Albuterol and Duonebs. Pt states these medications do help with his copd/asthma.     Referring provider: Claiborne Rigg, NP  HPI:   51 y.o. man, current smoker, who we are seeing for evaluation of recurrent bronchitis and shortness of breath.  ED note 04/2022 reviewed.  Most recent note from PCP office, 03/2022 reviewed.  Most recent pulmonary note from Dr. Sherene Sires 2016 reviewed.  Patient states in general he is feeling fine.  Day-to-day seems to do okay.  Has some shortness of breath.  Has tried multiple different inhalers including about every single ICS and ICS/LABA inhaler therapy in the past.  Mild improvement.  Helped some.  Unfortunate, despite good adherence to this he has what sounds like recurrent exacerbations of obstructive lung disease.  Typically with worsening shortness of breath and cough.  Responds well to steroids.  Most recently 03/2022.  Then slowly symptoms go back to baseline which includes mild dyspnea on exertion, daily cough.  He had multiple courses of prednisone a year.  Symptoms present throughout the day.  Usually worse in the morning.  Improves after inhaler therapy.  Currently on Advair.  No position make things better or worse.  No seasonal environmental factors he can identify to make things better or worse.  Albuterol helps some when needed.  No other alleviating or exacerbating factors.  Most recent chest x-ray 02/2022 on my review interpretation reveals clear lungs with significant hyperinflation.  PMH: Tobacco abuse, GERD Surgical history: I&D of right hand Family history: No significant rest or illness in first relatives Social history: Current smoker, lives in [place] /  Pulmonary Flowsheets:   ACT:      No data to display          MMRC:     No data to display          Epworth:      No data to display          Tests:   FENO:  No results found for: "NITRICOXIDE"  PFT:     No data to display          WALK:      No data to display          Imaging: Personally reviewed and as per EMR and discussion this note No results found.  Lab Results: Personally reviewed, notably eosinophils elevated CBC    Component Value Date/Time   WBC 5.3 03/18/2022 1639   WBC 9.1 08/09/2020 2000   RBC 4.07 (L) 03/18/2022 1639   RBC 4.33 08/09/2020 2000   HGB 13.7 03/18/2022 1639   HCT 38.1 03/18/2022 1639   PLT 236 03/18/2022 1639   MCV 94 03/18/2022 1639   MCH 33.7 (H) 03/18/2022 1639   MCH 34.4 (H) 08/09/2020 2000   MCHC 36.0 (H) 03/18/2022 1639   MCHC 35.3 08/09/2020 2000   RDW 11.9 03/18/2022 1639   LYMPHSABS 1.7 03/18/2022 1639   MONOABS 1.1 (H) 12/12/2018 1450   EOSABS 0.5 (H) 03/18/2022 1639   BASOSABS 0.1 03/18/2022 1639    BMET    Component Value Date/Time   NA 139 03/18/2022 1639  K 3.9 03/18/2022 1639   CL 103 03/18/2022 1639   CO2 22 03/18/2022 1639   GLUCOSE 89 03/18/2022 1639   GLUCOSE 91 08/09/2020 2000   BUN 10 03/18/2022 1639   CREATININE 1.17 03/18/2022 1639   CALCIUM 9.4 03/18/2022 1639   GFRNONAA >60 08/09/2020 2000   GFRAA >60 09/13/2019 1015    BNP    Component Value Date/Time   BNP 10.2 10/09/2014 2141    ProBNP No results found for: "PROBNP"  Specialty Problems       Pulmonary Problems   Asthma, chronic    Patient presents today for follow-up visit from ED for asthma exererbation. He was prescribed advair, albuterol and prednisone. He has not filled due to a lack of funds.He feels the albuterol is not working      COPD pfts pending/ still smoking     BREO rx x 6 weeks samples 06/11/2015 >>>       CAP (community acquired pneumonia)   Right middle lobe pneumonia   Pneumothorax  on right    Allergies  Allergen Reactions   Aspirin Anaphylaxis    NO PROBLEMS WITH IBUPROFEN   Peanut-Containing Drug Products Anaphylaxis   Shellfish Allergy Anaphylaxis and Shortness Of Breath   Amoxicillin Hives    Has patient had a PCN reaction causing immediate rash, facial/tongue/throat swelling, SOB or lightheadedness with hypotension:  Has patient had a PCN reaction causing severe rash involving mucus membranes or skin necrosis:  Has patient had a PCN reaction that required hospitalization Yes Has patient had a PCN reaction occurring within the last 10 years: If all of the above answers are "NO", then may proceed with Cephalosporin use.    Clindamycin/Lincomycin Other (See Comments)    Chest tightness   Iodine Nausea And Vomiting and Swelling   Kiwi Extract Swelling   Naproxen Hives   Tramadol     Breathes funny   Latex Itching and Rash    Immunization History  Administered Date(s) Administered   Influenza,inj,Quad PF,6+ Mos 07/05/2017   PFIZER(Purple Top)SARS-COV-2 Vaccination 11/09/2019, 12/04/2019   Rabies, IM 06/18/2014   Tdap 06/16/2014    Past Medical History:  Diagnosis Date   Arthritis    Asthma    COPD (chronic obstructive pulmonary disease) (HCC)    GERD (gastroesophageal reflux disease)    Sciatic nerve pain     Tobacco History: Social History   Tobacco Use  Smoking Status Every Day   Packs/day: 0.00   Years: 31.00   Total pack years: 0.00   Types: Cigarettes  Smokeless Tobacco Never  Tobacco Comments   Pt states he smoke 1/2 ppd. ALS 05/14/22   Ready to quit: Not Answered Counseling given: Not Answered Tobacco comments: Pt states he smoke 1/2 ppd. ALS 05/14/22     Outpatient Encounter Medications as of 05/14/2022  Medication Sig   acetaminophen (TYLENOL) 500 MG tablet Take 2 tablets (1,000 mg total) by mouth every 6 (six) hours as needed.   albuterol (PROVENTIL HFA) 108 (90 Base) MCG/ACT inhaler Inhale 2 puffs into the lungs every 6  (six) hours as needed for wheezing or shortness of breath.   betamethasone dipropionate 0.05 % cream Apply topically 2 (two) times daily.   Budeson-Glycopyrrol-Formoterol (BREZTRI AEROSPHERE) 160-9-4.8 MCG/ACT AERO Inhale 2 puffs into the lungs in the morning and at bedtime.   docusate sodium (COLACE) 100 MG capsule Take 1 capsule (100 mg total) by mouth 2 (two) times daily.   fluticasone (FLONASE) 50 MCG/ACT nasal spray Place 1  spray into both nostrils daily.   ibuprofen (ADVIL) 800 MG tablet Take 1 tablet (800 mg total) by mouth every 8 (eight) hours as needed.   lidocaine (XYLOCAINE) 2 % solution Use as directed 15 mLs in the mouth or throat as needed for mouth pain.   Multiple Vitamin (MULTIVITAMIN WITH MINERALS) TABS tablet Take 1 tablet by mouth daily.   omeprazole (PRILOSEC) 20 MG capsule Take 1 capsule (20 mg total) by mouth daily.   sodium chloride (OCEAN) 0.65 % SOLN nasal spray Place 1 spray into both nostrils as needed for congestion.   [DISCONTINUED] fluticasone-salmeterol (ADVAIR) 250-50 MCG/ACT AEPB Inhale 1 puff into the lungs in the morning and at bedtime.   ipratropium-albuterol (DUONEB) 0.5-2.5 (3) MG/3ML SOLN Take 3 mLs by nebulization every 6 (six) hours as needed.   No facility-administered encounter medications on file as of 05/14/2022.     Review of Systems  Review of Systems  No chest pain exertion.  No orthopnea or PND.  Comprehensive review of systems otherwise negative. Physical Exam  BP 126/76 (BP Location: Left Arm, Patient Position: Sitting, Cuff Size: Normal)   Pulse 64   Temp 98.4 F (36.9 C) (Oral)   Ht 5\' 9"  (1.753 m)   Wt 155 lb 9.6 oz (70.6 kg)   SpO2 98%   BMI 22.98 kg/m   Wt Readings from Last 5 Encounters:  05/14/22 155 lb 9.6 oz (70.6 kg)  03/18/22 151 lb 12.8 oz (68.9 kg)  01/01/22 157 lb (71.2 kg)  02/26/20 170 lb (77.1 kg)  09/09/19 165 lb (74.8 kg)    BMI Readings from Last 5 Encounters:  05/14/22 22.98 kg/m  03/18/22 21.17 kg/m   01/01/22 21.29 kg/m  02/26/20 23.06 kg/m  09/09/19 23.01 kg/m     Physical Exam General: Sitting in chair, no acute distress Eyes: EOMI, no icterus Neck: Supple, no JVP Pulmonary: Clear, normal work of breathing Cardiovascular: Warm, no edema Abdomen: Nondistended, Bowel sounds present MSK: Swelling of right hand and knuckles, no synovitis Neuro: Normal gait, no weakness Psych: Normal mood, full affect   Assessment & Plan:   Dyspnea on exertion: Intermittent.  Likely related to asthma.  Possible smoking-related disease.  Chest x-ray appears hyperinflated.  PFTs for further evaluation.  Escalate inhaler regimen as below.  Asthma: Moderate persistent.  Exacerbations twice a year requiring prednisone for the last couple years.  Escalate to triple inhaled therapy.  Continue as needed Saba via nebulizer and HFA.  Emphysema: Escalate mid dose Advair to high-dose Breztri.   Return in about 3 months (around 08/14/2022).   Lanier Clam, MD 05/14/2022

## 2022-05-14 NOTE — Telephone Encounter (Signed)
PA request received through CoverMyMeds for Breztri Aerosphere 160-9-4.8MCG/ACT aerosol.  PA has been submitted and is pending determination.   Key: [id number] - PA Case ID: [id number]

## 2022-05-15 ENCOUNTER — Other Ambulatory Visit: Payer: Self-pay

## 2022-05-15 ENCOUNTER — Other Ambulatory Visit: Payer: Self-pay | Admitting: Nurse Practitioner

## 2022-05-15 ENCOUNTER — Other Ambulatory Visit (HOSPITAL_COMMUNITY): Payer: Self-pay

## 2022-05-15 DIAGNOSIS — K219 Gastro-esophageal reflux disease without esophagitis: Secondary | ICD-10-CM

## 2022-05-15 MED ORDER — OMEPRAZOLE 20 MG PO CPDR
20.0000 mg | DELAYED_RELEASE_CAPSULE | Freq: Every day | ORAL | 2 refills | Status: DC
  Filled 2022-05-15 – 2022-06-10 (×3): qty 30, 30d supply, fill #0
  Filled 2022-07-05: qty 30, 30d supply, fill #1
  Filled 2022-08-05: qty 30, 30d supply, fill #2

## 2022-05-18 NOTE — Telephone Encounter (Signed)
Received a fax regarding Prior Authorization from [employer] for [employer] 160-9-4.8MCG/ACT aerosol.  Authorization has been DENIED because:   There is no indication your patient has met both of the following:  1. Failure/inadequate response  with Anoro Ellipta therapy (meaning patient requires add-on therapy with inhaled corticosteroids);    2. Inability to use Anora Ellipta in combination with each preferred inhaled corticosteroid therapy*.  *budesonide, Arnuity Ellipta, Flovent Diskus/HFA, and Asmanex/HFA**. **Prior authorization or  step therapy may apply.  Judithann Sauger Aerosphere is considered medically necessary when both of the following:    1. Failure/inadequate response with Anoro Ellipta (requiring continued add-on therapy with inhaled  corticosteroids);    2. Inability to use Anoro Ellipta in combination with all of the following inhaled  corticosteroid therapies: budesonide, Arnuity Ellipta, Flovent Diskus/HFA, and Asmanex/HFA*.  *may need prior authorization..   Denial letter has been attached in patients documents.

## 2022-05-19 ENCOUNTER — Other Ambulatory Visit: Payer: Self-pay | Admitting: Nurse Practitioner

## 2022-05-19 ENCOUNTER — Other Ambulatory Visit: Payer: Self-pay

## 2022-05-19 ENCOUNTER — Encounter: Payer: Self-pay | Admitting: Pulmonary Disease

## 2022-05-19 ENCOUNTER — Other Ambulatory Visit (HOSPITAL_COMMUNITY): Payer: Self-pay

## 2022-05-19 MED ORDER — ADULT MULTIVITAMIN W/MINERALS CH
1.0000 | ORAL_TABLET | Freq: Every day | ORAL | 2 refills | Status: AC
  Filled 2022-05-19: qty 130, 130d supply, fill #0
  Filled 2022-07-05: qty 90, 90d supply, fill #0
  Filled 2022-12-15: qty 130, 130d supply, fill #1

## 2022-05-19 NOTE — Telephone Encounter (Signed)
duplicate

## 2022-05-19 NOTE — Telephone Encounter (Signed)
Routing to prior authorization team for re-submission.  PA team - please route response directly back to triage team to April Smith. Thanks!  Knox Saliva, PharmD, MPH, BCPS, CPP Clinical Pharmacist (Rheumatology and Pulmonology)

## 2022-05-19 NOTE — Telephone Encounter (Signed)
Thank you for bringing this to my attention. He has indeed failed anoro and ICS combinations listed above in the past. Can we re-submit PA?

## 2022-05-19 NOTE — Telephone Encounter (Signed)
Received notification from [employer] regarding a prior authorization for BREZTRI 160-9-4.10mcg/ACT.   Authorization has been APPROVED from 05/19/2022 to 05/19/2023.   Per test claim, copay for 30 days supply is $40.  Key: [id number]  - PA Case ID: [id number]

## 2022-05-19 NOTE — Telephone Encounter (Signed)
Thank you for your help.

## 2022-05-20 ENCOUNTER — Other Ambulatory Visit (HOSPITAL_COMMUNITY): Payer: Self-pay

## 2022-05-28 ENCOUNTER — Other Ambulatory Visit: Payer: Self-pay

## 2022-05-28 ENCOUNTER — Other Ambulatory Visit (HOSPITAL_COMMUNITY): Payer: Self-pay

## 2022-05-28 NOTE — Telephone Encounter (Signed)
Please see new mychart messages sent by pt. 

## 2022-05-29 NOTE — Telephone Encounter (Signed)
Dr. Silas Flood, please advise if it is ok to send in script for Singulair. It was recently removed from pt's med list by another provider (dentistry) and hasnt been ordered by pulmonary yet. Thanks.

## 2022-05-31 NOTE — Telephone Encounter (Signed)
[place] for montelukast script to be sent to [facility]

## 2022-06-01 MED ORDER — MONTELUKAST SODIUM 10 MG PO TABS: 10.0000 mg | ORAL_TABLET | Freq: Every day | ORAL | 11 refills | Status: AC

## 2022-06-08 ENCOUNTER — Other Ambulatory Visit (HOSPITAL_COMMUNITY): Payer: Self-pay

## 2022-06-10 ENCOUNTER — Ambulatory Visit: Payer: Commercial Managed Care - HMO | Attending: Nurse Practitioner | Admitting: Nurse Practitioner

## 2022-06-10 ENCOUNTER — Encounter: Payer: Self-pay | Admitting: Nurse Practitioner

## 2022-06-10 ENCOUNTER — Other Ambulatory Visit: Payer: Self-pay

## 2022-06-10 VITALS — BP 136/86 | HR 74 | Temp 98.0°F | Ht 69.0 in | Wt 164.4 lb

## 2022-06-10 DIAGNOSIS — M21941 Unspecified acquired deformity of hand, right hand: Secondary | ICD-10-CM

## 2022-06-10 DIAGNOSIS — E785 Hyperlipidemia, unspecified: Secondary | ICD-10-CM

## 2022-06-10 DIAGNOSIS — F17200 Nicotine dependence, unspecified, uncomplicated: Secondary | ICD-10-CM | POA: Diagnosis not present

## 2022-06-10 DIAGNOSIS — Z1211 Encounter for screening for malignant neoplasm of colon: Secondary | ICD-10-CM

## 2022-06-10 DIAGNOSIS — Z0100 Encounter for examination of eyes and vision without abnormal findings: Secondary | ICD-10-CM

## 2022-06-10 DIAGNOSIS — R0981 Nasal congestion: Secondary | ICD-10-CM | POA: Diagnosis not present

## 2022-06-10 DIAGNOSIS — Z23 Encounter for immunization: Secondary | ICD-10-CM

## 2022-06-10 MED ORDER — AZELASTINE-FLUTICASONE 137-50 MCG/ACT NA SUSP
1.0000 | Freq: Two times a day (BID) | NASAL | 6 refills | Status: DC
  Filled 2022-06-10: qty 23, fill #0
  Filled 2022-07-05: qty 23, 30d supply, fill #0
  Filled 2022-08-05: qty 23, 30d supply, fill #1
  Filled 2022-08-27 – 2022-10-29 (×3): qty 23, 30d supply, fill #2

## 2022-06-10 MED ORDER — NICOTINE 21 MG/24HR TD PT24
21.0000 mg | MEDICATED_PATCH | Freq: Every day | TRANSDERMAL | 0 refills | Status: AC
  Filled 2022-06-10: qty 28, 28d supply, fill #0
  Filled 2022-07-05: qty 14, 14d supply, fill #1

## 2022-06-10 NOTE — Addendum Note (Signed)
Addended by: Gasper Lloyd on: 06/10/2022 11:18 AM   Modules accepted: Orders
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
## 2022-06-11 LAB — LIPID PANEL
Chol/HDL Ratio: 2.4 ratio (ref 0.0–5.0)
Cholesterol, Total: 215 mg/dL — ABNORMAL HIGH (ref 100–199)
HDL: 91 mg/dL (ref >39)
LDL Chol Calc (NIH): 113 mg/dL — ABNORMAL HIGH (ref 0–99)
Triglycerides: 62 mg/dL (ref 0–149)
VLDL Cholesterol Cal: 11 mg/dL (ref 5–40)

## 2022-07-05 ENCOUNTER — Other Ambulatory Visit: Payer: Self-pay | Admitting: Family Medicine

## 2022-07-05 ENCOUNTER — Other Ambulatory Visit (HOSPITAL_COMMUNITY): Payer: Self-pay

## 2022-07-06 ENCOUNTER — Other Ambulatory Visit (HOSPITAL_COMMUNITY): Payer: Self-pay

## 2022-07-07 ENCOUNTER — Other Ambulatory Visit (HOSPITAL_COMMUNITY): Payer: Self-pay

## 2022-07-07 MED ORDER — ALBUTEROL SULFATE HFA 108 (90 BASE) MCG/ACT IN AERS
2.0000 | INHALATION_SPRAY | Freq: Four times a day (QID) | RESPIRATORY_TRACT | 2 refills | Status: DC | PRN
  Filled 2022-07-07: qty 6.7, 25d supply, fill #0
  Filled 2022-10-29 (×3): qty 6.7, 25d supply, fill #1

## 2022-07-07 NOTE — Telephone Encounter (Signed)
Requested Prescriptions  Pending Prescriptions Disp Refills   albuterol (PROVENTIL HFA) 108 (90 Base) MCG/ACT inhaler 6.7 g 2    Sig: Inhale 2 puffs into the lungs every 6 (six) hours as needed for wheezing or shortness of breath.     Pulmonology:  Beta Agonists 2 Passed - 07/05/2022 10:21 AM      Passed - Last BP in normal range    BP Readings from Last 1 Encounters:  06/10/22 136/86         Passed - Last Heart Rate in normal range    Pulse Readings from Last 1 Encounters:  06/10/22 74         Passed - Valid encounter within last 12 months    Recent Outpatient Visits           3 weeks ago Acquired hand deformity, right   [facility] [place], [place] W, NP   3 months ago Chronic obstructive pulmonary disease with acute exacerbation [facility])   [facility] [place], Shea Stakes, NP   6 months ago Allergic conjunctivitis of both eyes   [facility] [place], [place], [place]   1 year ago Mild intermittent chronic asthma without complication   [facility] [place], Shea Stakes, NP   2 years ago Poor dentition   [facility] [place], Marzella Schlein, [place]       Future Appointments             In 2 months Claiborne Rigg, NP [facility]

## 2022-07-08 ENCOUNTER — Other Ambulatory Visit (HOSPITAL_COMMUNITY): Payer: Self-pay

## 2022-07-10 ENCOUNTER — Telehealth: Payer: Self-pay | Admitting: Nurse Practitioner

## 2022-07-10 NOTE — Telephone Encounter (Signed)
Message sent with information.

## 2022-07-10 NOTE — Telephone Encounter (Signed)
Pt is calling to request a referral for a dentist, eye doctor, and chiroprator

## 2022-07-23 ENCOUNTER — Other Ambulatory Visit: Payer: Self-pay

## 2022-07-24 ENCOUNTER — Ambulatory Visit: Payer: Commercial Managed Care - HMO

## 2022-07-30 ENCOUNTER — Other Ambulatory Visit: Payer: Self-pay

## 2022-08-05 ENCOUNTER — Other Ambulatory Visit: Payer: Self-pay

## 2022-08-07 ENCOUNTER — Other Ambulatory Visit: Payer: Self-pay

## 2022-08-07 ENCOUNTER — Ambulatory Visit: Payer: Commercial Managed Care - HMO

## 2022-08-07 MED ORDER — ZOSTER VAC RECOMB ADJUVANTED 50 MCG/0.5ML IM SUSR
0.5000 mL | Freq: Once | INTRAMUSCULAR | 1 refills | Status: DC
  Filled 2022-08-07: qty 0.5, 1d supply, fill #0

## 2022-08-07 MED ORDER — ZOSTER VAC RECOMB ADJUVANTED 50 MCG/0.5ML IM SUSR
INTRAMUSCULAR | 0 refills | Status: AC
  Filled 2022-12-15: qty 0.5, 1d supply, fill #0

## 2022-08-18 ENCOUNTER — Other Ambulatory Visit: Payer: Self-pay | Admitting: Family Medicine

## 2022-08-18 ENCOUNTER — Other Ambulatory Visit: Payer: Self-pay

## 2022-08-18 DIAGNOSIS — K219 Gastro-esophageal reflux disease without esophagitis: Secondary | ICD-10-CM

## 2022-08-18 MED ORDER — OMEPRAZOLE 20 MG PO CPDR
20.0000 mg | DELAYED_RELEASE_CAPSULE | Freq: Every day | ORAL | 0 refills | Status: DC
  Filled 2022-08-18 – 2022-08-27 (×2): qty 30, 30d supply, fill #0

## 2022-08-18 NOTE — Telephone Encounter (Signed)
Future visit in 1 month.  Requested Prescriptions  Pending Prescriptions Disp Refills   omeprazole (PRILOSEC) 20 MG capsule 30 capsule 0    Sig: Take 1 capsule (20 mg total) by mouth daily.     Gastroenterology: Proton Pump Inhibitors Passed - 08/18/2022  3:01 PM      Passed - Valid encounter within last 12 months    Recent Outpatient Visits           2 months ago Acquired hand deformity, right   [place], [place] W, NP   5 months ago Chronic obstructive pulmonary disease with acute exacerbation [facility])   [place], Vernia Buff, NP   7 months ago Allergic conjunctivitis of both eyes   [place], [place], [place]   1 year ago Mild intermittent chronic asthma without complication   [place], Vernia Buff, NP   2 years ago Poor dentition   [place], Dionne Bucy, [place]       Future Appointments             In 3 weeks Hunsucker, Bonna Gains, MD [facility]   In 1 month Gildardo Pounds, NP [place]

## 2022-08-27 ENCOUNTER — Emergency Department (HOSPITAL_COMMUNITY): Payer: Commercial Managed Care - HMO

## 2022-08-27 ENCOUNTER — Other Ambulatory Visit: Payer: Self-pay

## 2022-08-27 ENCOUNTER — Emergency Department (HOSPITAL_COMMUNITY)
Admission: EM | Admit: 2022-08-27 | Discharge: 2022-08-27 | Disposition: A | Discharge status: Home / Self Care | Payer: Commercial Managed Care - HMO | Attending: Emergency Medicine | Admitting: Emergency Medicine

## 2022-08-27 ENCOUNTER — Ambulatory Visit: Payer: Self-pay

## 2022-08-27 DIAGNOSIS — N189 Chronic kidney disease, unspecified: Secondary | ICD-10-CM | POA: Insufficient documentation

## 2022-08-27 DIAGNOSIS — R0789 Other chest pain: Secondary | ICD-10-CM | POA: Insufficient documentation

## 2022-08-27 DIAGNOSIS — J45909 Unspecified asthma, uncomplicated: Secondary | ICD-10-CM | POA: Insufficient documentation

## 2022-08-27 DIAGNOSIS — Z7951 Long term (current) use of inhaled steroids: Secondary | ICD-10-CM | POA: Insufficient documentation

## 2022-08-27 DIAGNOSIS — Z79899 Other long term (current) drug therapy: Secondary | ICD-10-CM | POA: Insufficient documentation

## 2022-08-27 DIAGNOSIS — J449 Chronic obstructive pulmonary disease, unspecified: Secondary | ICD-10-CM | POA: Insufficient documentation

## 2022-08-27 DIAGNOSIS — F17200 Nicotine dependence, unspecified, uncomplicated: Secondary | ICD-10-CM | POA: Insufficient documentation

## 2022-08-27 DIAGNOSIS — Z91010 Allergy to peanuts: Secondary | ICD-10-CM | POA: Insufficient documentation

## 2022-08-27 DIAGNOSIS — Z91040 Latex allergy status: Secondary | ICD-10-CM | POA: Insufficient documentation

## 2022-08-27 LAB — BASIC METABOLIC PANEL
Anion gap: 6 (ref 5–15)
BUN: 8 mg/dL (ref 6–20)
CO2: 29 mmol/L (ref 22–32)
Calcium: 8.6 mg/dL — ABNORMAL LOW (ref 8.9–10.3)
Chloride: 102 mmol/L (ref 98–111)
Creatinine, Ser: 1.07 mg/dL (ref 0.61–1.24)
GFR, Estimated: >60 mL/min (ref >60)
Glucose, Bld: 88 mg/dL (ref 70–99)
Potassium: 3.9 mmol/L (ref 3.5–5.1)
Sodium: 137 mmol/L (ref 135–145)

## 2022-08-27 LAB — CBC
HCT: 39.4 % (ref 39.0–52.0)
Hemoglobin: 13.8 g/dL (ref 13.0–17.0)
MCH: 34.1 pg — ABNORMAL HIGH (ref 26.0–34.0)
MCHC: 35 g/dL (ref 30.0–36.0)
MCV: 97.3 fL (ref 80.0–100.0)
Platelets: 250 10*3/uL (ref 150–400)
RBC: 4.05 MIL/uL — ABNORMAL LOW (ref 4.22–5.81)
RDW: 13.7 % (ref 11.5–15.5)
WBC: 4.2 10*3/uL (ref 4.0–10.5)
nRBC: 0 % (ref 0.0–0.2)

## 2022-08-27 LAB — TROPONIN I (HIGH SENSITIVITY)
Troponin I (High Sensitivity): 5 ng/L (ref <18)
Troponin I (High Sensitivity): 4 ng/L (ref <18)

## 2022-08-27 MED ORDER — LIDOCAINE 5 % EX PTCH: 1.0000 | MEDICATED_PATCH | CUTANEOUS | 0 refills | Status: AC

## 2022-08-27 MED ORDER — LIDOCAINE 5 % EX PTCH
1.0000 | MEDICATED_PATCH | CUTANEOUS | 0 refills | Status: DC
  Filled 2022-08-27: qty 30, 30d supply, fill #0

## 2022-08-27 MED ORDER — ACETAMINOPHEN 500 MG PO TABS
1000.0000 mg | ORAL_TABLET | Freq: Once | ORAL | Status: AC
  Administered 2022-08-27: 1000 mg via ORAL
  Filled 2022-08-27: qty 2

## 2022-08-27 NOTE — Discharge Instructions (Addendum)
You have been evaluated for your chest pain.  Fortunately no evidence to suggest an ongoing heart attack.  Your pain may be due to chest wall pain such as an injury to your ribs.  Please apply Lidoderm patch to the affected area daily for symptom control.  You may take over-the-counter Tylenol as needed for pain.  Avoid tobacco use as it is negatively impacting your health.  Return to the ER if you have any concern.

## 2022-08-27 NOTE — Telephone Encounter (Signed)
     Chief Complaint: Chest pain, left side. Getting worse. Symptoms: Pain 8/10. Frequency: 2 days ago Pertinent Negatives: Patient denies  Disposition: [x] ED /[] Urgent Care (no appt availability in office) / [] Appointment(In office/virtual)/ []  [place] [facility]/ [] Home Care/ [] Refused Recommended Disposition /[] [place] [facility]/ []  Follow-up with PCP Additional Notes: Will 911 for worsening of symptoms.  Reason for Disposition  Patient sounds very sick or weak to the triager  Answer Assessment - Initial Assessment Questions 1. LOCATION: "Where does it hurt?"       Left side 2. RADIATION: "Does the pain go anywhere else?" (e.g., into neck, jaw, arms, back)     No 3. ONSET: "When did the chest pain begin?" (Minutes, hours or days)      2 days ago 4. PATTERN: "Does the pain come and go, or has it been constant since it started?"  "Does it get worse with exertion?"      Constant 5. DURATION: "How long does it last" (e.g., seconds, minutes, hours)     Hours 6. SEVERITY: "How bad is the pain?"  (e.g., Scale 1-10; mild, moderate, or severe)    - MILD (1-3): doesn't interfere with normal activities     - MODERATE (4-7): interferes with normal activities or awakens from sleep    - SEVERE (8-10): excruciating pain, unable to do any normal activities       Now - 8 7. CARDIAC RISK FACTORS: "Do you have any history of heart problems or risk factors for heart disease?" (e.g., angina, prior heart attack; diabetes, high blood pressure, high cholesterol, smoker, or strong family history of heart disease)     arhythmia 8. PULMONARY RISK FACTORS: "Do you have any history of lung disease?"  (e.g., blood clots in lung, asthma, emphysema, birth control pills)     Asthma 9. CAUSE: "What do you think is causing the chest pain?"     Unsure 10. OTHER SYMPTOMS: "Do you have any other symptoms?" (e.g., dizziness, nausea, vomiting, sweating, fever, difficulty breathing, cough)       No 11.  PREGNANCY: "Is there any chance you are pregnant?" "When was your last menstrual period?"       N/a  Protocols used: Chest Pain-A-AH
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
## 2022-08-27 NOTE — ED Provider Triage Note (Signed)
Emergency Medicine Provider Triage Evaluation Note  Keith Weber , a 52 y.o. male  was evaluated in triage.  Pt complains of left sided chest pain for the past 2-3 days. Worse with deep breaths and coughing. No SOB. No trauma to the chest.  No history of malignancy, DVT/PE, hormone use, long travel.  Review of Systems  Positive:  Negative:   Physical Exam  BP (!) 127/94 (BP Location: Right Arm)   Pulse 70   Temp 97.9 F (36.6 C)   Resp 16   SpO2 99%  Gen:   Awake, no distress   Resp:  Normal effort  MSK:   Moves extremities without difficulty  Other:  Tenderness to the lower left chest. No crepitus. RRR. CTAB.  Medical Decision Making  Medically screening exam initiated at 3:04 PM.  Appropriate orders placed.  Keith Weber was informed that the remainder of the evaluation will be completed by another provider, this initial triage assessment does not replace that evaluation, and the importance of remaining in the ED until their evaluation is complete.  Cardiac labs. CXR. EKG.   Keith Puller, PA-C 08/27/22 1505

## 2022-08-27 NOTE — ED Triage Notes (Signed)
Patient BIB [facility] from home for left sided chest pain that started three days ago and is made worse by sneezing and coughing. Denies recent injury but states the pain feels like when he broke a rib. Patient states he called PCP and was told to go to the ED for evaluation.

## 2022-08-28 ENCOUNTER — Other Ambulatory Visit: Payer: Self-pay

## 2022-08-31 ENCOUNTER — Other Ambulatory Visit: Payer: Self-pay

## 2022-08-31 ENCOUNTER — Other Ambulatory Visit (HOSPITAL_COMMUNITY): Payer: Self-pay

## 2022-09-02 ENCOUNTER — Other Ambulatory Visit: Payer: Self-pay

## 2022-09-10 ENCOUNTER — Ambulatory Visit: Payer: Commercial Managed Care - HMO | Admitting: Pulmonary Disease

## 2022-09-14 ENCOUNTER — Ambulatory Visit: Payer: Commercial Managed Care - HMO | Attending: Nurse Practitioner

## 2022-09-14 DIAGNOSIS — Z23 Encounter for immunization: Secondary | ICD-10-CM | POA: Diagnosis not present

## 2022-09-14 NOTE — Progress Notes (Signed)
Shingrix vaccine administered  right deltoid per protocols.  Information sheet given. Patient denies and pain or discomfort at injection site. Tolerated injection well no reaction.  

## 2022-09-21 ENCOUNTER — Ambulatory Visit: Payer: Commercial Managed Care - HMO | Admitting: Nurse Practitioner

## 2022-09-25 ENCOUNTER — Ambulatory Visit: Payer: Commercial Managed Care - HMO | Admitting: Pulmonary Disease

## 2022-09-29 ENCOUNTER — Other Ambulatory Visit: Payer: Self-pay | Admitting: Family Medicine

## 2022-09-29 ENCOUNTER — Other Ambulatory Visit: Payer: Self-pay

## 2022-09-29 DIAGNOSIS — K219 Gastro-esophageal reflux disease without esophagitis: Secondary | ICD-10-CM

## 2022-09-29 MED ORDER — OMEPRAZOLE 20 MG PO CPDR
20.0000 mg | DELAYED_RELEASE_CAPSULE | Freq: Every day | ORAL | 0 refills | Status: DC
  Filled 2022-09-29 – 2022-12-23 (×5): qty 30, 30d supply, fill #0

## 2022-09-30 ENCOUNTER — Other Ambulatory Visit: Payer: Self-pay

## 2022-10-01 ENCOUNTER — Other Ambulatory Visit (HOSPITAL_COMMUNITY): Payer: Self-pay

## 2022-10-01 ENCOUNTER — Encounter (HOSPITAL_COMMUNITY): Payer: Self-pay

## 2022-10-06 ENCOUNTER — Other Ambulatory Visit: Payer: Self-pay

## 2022-10-29 ENCOUNTER — Other Ambulatory Visit: Payer: Self-pay

## 2022-10-29 ENCOUNTER — Other Ambulatory Visit: Payer: Self-pay | Admitting: Pharmacist

## 2022-10-29 ENCOUNTER — Other Ambulatory Visit: Payer: Self-pay | Admitting: Nurse Practitioner

## 2022-10-29 ENCOUNTER — Other Ambulatory Visit (HOSPITAL_COMMUNITY): Payer: Self-pay

## 2022-10-29 DIAGNOSIS — J441 Chronic obstructive pulmonary disease with (acute) exacerbation: Secondary | ICD-10-CM

## 2022-10-29 DIAGNOSIS — J4520 Mild intermittent asthma, uncomplicated: Secondary | ICD-10-CM

## 2022-10-29 DIAGNOSIS — R0981 Nasal congestion: Secondary | ICD-10-CM

## 2022-10-29 MED ORDER — AZELASTINE HCL 0.1 % NA SOLN
1.0000 | Freq: Two times a day (BID) | NASAL | 0 refills | Status: DC
  Filled 2022-10-29: qty 30, 50d supply, fill #0
  Filled 2022-12-15: qty 30, 90d supply, fill #0
  Filled 2022-12-23: qty 30, 100d supply, fill #0

## 2022-10-29 MED ORDER — ALBUTEROL SULFATE HFA 108 (90 BASE) MCG/ACT IN AERS
2.0000 | INHALATION_SPRAY | Freq: Four times a day (QID) | RESPIRATORY_TRACT | 0 refills | Status: AC | PRN
  Filled 2022-10-29 – 2023-08-19 (×2): qty 18, 25d supply, fill #0

## 2022-10-29 MED ORDER — FLUTICASONE PROPIONATE 50 MCG/ACT NA SUSP
1.0000 | Freq: Every day | NASAL | 0 refills | Status: DC
  Filled 2022-10-29: qty 16, 30d supply, fill #0
  Filled 2022-12-15: qty 16, 60d supply, fill #0
  Filled 2022-12-23: qty 16, 30d supply, fill #0

## 2022-10-29 MED ORDER — IPRATROPIUM-ALBUTEROL 0.5-2.5 (3) MG/3ML IN SOLN
3.0000 mL | Freq: Four times a day (QID) | RESPIRATORY_TRACT | 0 refills | Status: DC | PRN
  Filled 2022-10-29 – 2023-04-23 (×2): qty 360, 30d supply, fill #0

## 2022-10-30 ENCOUNTER — Other Ambulatory Visit (HOSPITAL_COMMUNITY): Payer: Self-pay

## 2022-10-30 ENCOUNTER — Encounter (HOSPITAL_COMMUNITY): Payer: Self-pay

## 2022-11-04 ENCOUNTER — Other Ambulatory Visit: Payer: Self-pay

## 2022-12-15 ENCOUNTER — Other Ambulatory Visit: Payer: Self-pay

## 2022-12-15 ENCOUNTER — Other Ambulatory Visit (HOSPITAL_COMMUNITY): Payer: Self-pay

## 2022-12-16 ENCOUNTER — Other Ambulatory Visit: Payer: Self-pay

## 2022-12-16 ENCOUNTER — Other Ambulatory Visit (HOSPITAL_COMMUNITY): Payer: Self-pay

## 2022-12-16 ENCOUNTER — Ambulatory Visit: Payer: Commercial Managed Care - HMO | Admitting: Pulmonary Disease

## 2022-12-17 ENCOUNTER — Encounter: Payer: Self-pay | Admitting: Pharmacist

## 2022-12-17 ENCOUNTER — Other Ambulatory Visit: Payer: Self-pay

## 2022-12-21 ENCOUNTER — Other Ambulatory Visit: Payer: Self-pay

## 2022-12-23 ENCOUNTER — Encounter: Payer: Self-pay | Admitting: Pharmacist

## 2022-12-23 ENCOUNTER — Other Ambulatory Visit: Payer: Self-pay

## 2022-12-25 ENCOUNTER — Other Ambulatory Visit (HOSPITAL_COMMUNITY): Payer: Self-pay

## 2022-12-26 ENCOUNTER — Other Ambulatory Visit (HOSPITAL_COMMUNITY): Payer: Self-pay

## 2022-12-31 ENCOUNTER — Encounter: Payer: Self-pay | Admitting: Pulmonary Disease

## 2023-01-18 ENCOUNTER — Other Ambulatory Visit: Payer: Self-pay

## 2023-01-19 ENCOUNTER — Encounter: Payer: Self-pay | Admitting: Pharmacist

## 2023-01-19 ENCOUNTER — Other Ambulatory Visit: Payer: Self-pay

## 2023-01-19 ENCOUNTER — Other Ambulatory Visit: Payer: Self-pay | Admitting: Family Medicine

## 2023-01-19 DIAGNOSIS — R0981 Nasal congestion: Secondary | ICD-10-CM

## 2023-01-19 DIAGNOSIS — K219 Gastro-esophageal reflux disease without esophagitis: Secondary | ICD-10-CM

## 2023-01-19 MED ORDER — FLUTICASONE PROPIONATE 50 MCG/ACT NA SUSP
1.0000 | Freq: Every day | NASAL | 0 refills | Status: DC
  Filled 2023-01-19: qty 16, 60d supply, fill #0
  Filled 2023-01-27 (×2): qty 16, 30d supply, fill #0
  Filled 2023-02-04: qty 16, 60d supply, fill #0
  Filled 2023-02-09 – 2023-02-12 (×2): qty 16, 30d supply, fill #0

## 2023-01-19 MED ORDER — AZELASTINE HCL 0.1 % NA SOLN
1.0000 | Freq: Two times a day (BID) | NASAL | 0 refills | Status: AC
  Filled 2023-01-19 – 2023-05-20 (×2): qty 30, 100d supply, fill #0

## 2023-01-19 MED ORDER — OMEPRAZOLE 20 MG PO CPDR
20.0000 mg | DELAYED_RELEASE_CAPSULE | Freq: Every day | ORAL | 0 refills | Status: DC
  Filled 2023-01-19 – 2023-04-23 (×5): qty 30, 30d supply, fill #0

## 2023-01-22 ENCOUNTER — Other Ambulatory Visit: Payer: Self-pay

## 2023-01-26 ENCOUNTER — Other Ambulatory Visit: Payer: Self-pay

## 2023-01-27 ENCOUNTER — Other Ambulatory Visit (HOSPITAL_COMMUNITY): Payer: Self-pay

## 2023-01-27 ENCOUNTER — Other Ambulatory Visit: Payer: Self-pay

## 2023-02-01 ENCOUNTER — Other Ambulatory Visit: Payer: Self-pay

## 2023-02-04 ENCOUNTER — Other Ambulatory Visit: Payer: Self-pay

## 2023-02-04 ENCOUNTER — Other Ambulatory Visit (HOSPITAL_COMMUNITY): Payer: Self-pay

## 2023-02-09 ENCOUNTER — Other Ambulatory Visit (HOSPITAL_COMMUNITY): Payer: Self-pay

## 2023-02-09 ENCOUNTER — Other Ambulatory Visit: Payer: Self-pay

## 2023-02-12 ENCOUNTER — Encounter: Payer: Self-pay | Admitting: Pharmacist

## 2023-02-12 ENCOUNTER — Other Ambulatory Visit: Payer: Self-pay

## 2023-02-12 ENCOUNTER — Other Ambulatory Visit (HOSPITAL_COMMUNITY): Payer: Self-pay

## 2023-02-15 ENCOUNTER — Other Ambulatory Visit (HOSPITAL_COMMUNITY): Payer: Self-pay

## 2023-02-16 ENCOUNTER — Other Ambulatory Visit: Payer: Self-pay

## 2023-02-18 ENCOUNTER — Other Ambulatory Visit (HOSPITAL_BASED_OUTPATIENT_CLINIC_OR_DEPARTMENT_OTHER): Payer: Self-pay

## 2023-02-18 ENCOUNTER — Other Ambulatory Visit (HOSPITAL_COMMUNITY): Payer: Self-pay

## 2023-03-15 ENCOUNTER — Other Ambulatory Visit: Payer: Self-pay | Admitting: Family Medicine

## 2023-03-15 ENCOUNTER — Other Ambulatory Visit: Payer: Self-pay

## 2023-03-15 ENCOUNTER — Other Ambulatory Visit (HOSPITAL_COMMUNITY): Payer: Self-pay

## 2023-03-15 DIAGNOSIS — R0981 Nasal congestion: Secondary | ICD-10-CM

## 2023-03-16 ENCOUNTER — Other Ambulatory Visit: Payer: Self-pay

## 2023-03-16 ENCOUNTER — Other Ambulatory Visit (HOSPITAL_COMMUNITY): Payer: Self-pay

## 2023-03-16 MED ORDER — FLUTICASONE PROPIONATE 50 MCG/ACT NA SUSP
1.0000 | Freq: Every day | NASAL | 0 refills | Status: DC
  Filled 2023-03-16: qty 16, 60d supply, fill #0

## 2023-03-16 NOTE — Telephone Encounter (Signed)
Requested Prescriptions  Pending Prescriptions Disp Refills   fluticasone (FLONASE) 50 MCG/ACT nasal spray 16 g 0    Sig: Place 1 spray into both nostrils daily.     Ear, Nose, and Throat: Nasal Preparations - Corticosteroids Passed - 03/15/2023  2:11 PM      Passed - Valid encounter within last 12 months    Recent Outpatient Visits           9 months ago Acquired hand deformity, right   [facility] [place], [place] W, NP   12 months ago Chronic obstructive pulmonary disease with acute exacerbation [facility])   [place] [facility] [place], Shea Stakes, NP   1 year ago Allergic conjunctivitis of both eyes   [facility] [place], [place], [place]   2 years ago Mild intermittent chronic asthma without complication   [facility] [place], Shea Stakes, NP   3 years ago Poor dentition   [facility] [place], [place], [place]

## 2023-04-23 ENCOUNTER — Other Ambulatory Visit: Payer: Self-pay

## 2023-04-23 ENCOUNTER — Other Ambulatory Visit (HOSPITAL_COMMUNITY): Payer: Self-pay

## 2023-05-20 ENCOUNTER — Other Ambulatory Visit: Payer: Self-pay

## 2023-05-20 ENCOUNTER — Other Ambulatory Visit: Payer: Self-pay | Admitting: Pulmonary Disease

## 2023-05-20 ENCOUNTER — Other Ambulatory Visit: Payer: Self-pay | Admitting: Family Medicine

## 2023-05-20 ENCOUNTER — Other Ambulatory Visit (HOSPITAL_COMMUNITY): Payer: Self-pay

## 2023-05-20 DIAGNOSIS — K219 Gastro-esophageal reflux disease without esophagitis: Secondary | ICD-10-CM

## 2023-05-20 DIAGNOSIS — J4520 Mild intermittent asthma, uncomplicated: Secondary | ICD-10-CM

## 2023-05-20 DIAGNOSIS — R0981 Nasal congestion: Secondary | ICD-10-CM

## 2023-05-20 DIAGNOSIS — J441 Chronic obstructive pulmonary disease with (acute) exacerbation: Secondary | ICD-10-CM

## 2023-05-21 ENCOUNTER — Other Ambulatory Visit (HOSPITAL_COMMUNITY): Payer: Self-pay

## 2023-05-21 ENCOUNTER — Other Ambulatory Visit: Payer: Self-pay

## 2023-05-21 MED ORDER — BREZTRI AEROSPHERE 160-9-4.8 MCG/ACT IN AERO
2.0000 | INHALATION_SPRAY | Freq: Two times a day (BID) | RESPIRATORY_TRACT | 1 refills | Status: DC
  Filled 2023-05-21: qty 10.7, 30d supply, fill #0
  Filled 2023-06-18: qty 10.7, 30d supply, fill #1

## 2023-05-24 ENCOUNTER — Other Ambulatory Visit: Payer: Self-pay

## 2023-06-18 ENCOUNTER — Other Ambulatory Visit (HOSPITAL_COMMUNITY): Payer: Self-pay

## 2023-06-18 ENCOUNTER — Other Ambulatory Visit: Payer: Self-pay

## 2023-06-29 ENCOUNTER — Other Ambulatory Visit (HOSPITAL_COMMUNITY): Payer: Self-pay

## 2023-07-13 ENCOUNTER — Other Ambulatory Visit: Payer: Self-pay

## 2023-07-14 ENCOUNTER — Other Ambulatory Visit: Payer: Self-pay

## 2023-07-15 ENCOUNTER — Other Ambulatory Visit: Payer: Self-pay

## 2023-08-09 ENCOUNTER — Other Ambulatory Visit: Payer: Self-pay

## 2023-08-09 ENCOUNTER — Other Ambulatory Visit: Payer: Self-pay | Admitting: Pulmonary Disease

## 2023-08-09 ENCOUNTER — Other Ambulatory Visit: Payer: Self-pay | Admitting: Nurse Practitioner

## 2023-08-09 DIAGNOSIS — K219 Gastro-esophageal reflux disease without esophagitis: Secondary | ICD-10-CM

## 2023-08-09 DIAGNOSIS — R0981 Nasal congestion: Secondary | ICD-10-CM

## 2023-08-09 MED ORDER — BREZTRI AEROSPHERE 160-9-4.8 MCG/ACT IN AERO
2.0000 | INHALATION_SPRAY | Freq: Two times a day (BID) | RESPIRATORY_TRACT | 0 refills | Status: DC
  Filled 2023-08-09 – 2023-08-19 (×2): qty 10.7, 30d supply, fill #0

## 2023-08-09 NOTE — Telephone Encounter (Signed)
Pt must have an appointment for anymore refills. Not seen in 1 year.

## 2023-08-19 ENCOUNTER — Other Ambulatory Visit: Payer: Self-pay

## 2023-08-19 ENCOUNTER — Other Ambulatory Visit (HOSPITAL_COMMUNITY): Payer: Self-pay

## 2023-08-20 ENCOUNTER — Other Ambulatory Visit (HOSPITAL_COMMUNITY): Payer: Self-pay

## 2023-08-20 ENCOUNTER — Other Ambulatory Visit: Payer: Self-pay

## 2023-08-20 MED ORDER — FLUTICASONE PROPIONATE 50 MCG/ACT NA SUSP
1.0000 | Freq: Every day | NASAL | 0 refills | Status: DC
  Filled 2023-08-20: qty 16, 60d supply, fill #0

## 2023-08-20 MED ORDER — OMEPRAZOLE 20 MG PO CPDR
20.0000 mg | DELAYED_RELEASE_CAPSULE | Freq: Every day | ORAL | 0 refills | Status: DC
  Filled 2023-08-20 (×2): qty 30, 30d supply, fill #0

## 2023-10-11 ENCOUNTER — Other Ambulatory Visit: Payer: Self-pay

## 2023-10-11 ENCOUNTER — Other Ambulatory Visit: Payer: Self-pay | Admitting: Family Medicine

## 2023-10-11 ENCOUNTER — Other Ambulatory Visit: Payer: Self-pay | Admitting: Pulmonary Disease

## 2023-10-11 DIAGNOSIS — R0981 Nasal congestion: Secondary | ICD-10-CM

## 2023-10-12 ENCOUNTER — Other Ambulatory Visit: Payer: Self-pay

## 2023-10-13 ENCOUNTER — Other Ambulatory Visit: Payer: Self-pay

## 2023-10-15 MED ORDER — OMEPRAZOLE 20 MG PO CPDR
20.0000 mg | DELAYED_RELEASE_CAPSULE | Freq: Every day | ORAL | 0 refills | Status: AC
  Filled 2023-10-15 – 2023-10-18 (×2): qty 30, 30d supply, fill #0

## 2023-10-18 ENCOUNTER — Other Ambulatory Visit: Payer: Self-pay

## 2023-10-18 ENCOUNTER — Other Ambulatory Visit (HOSPITAL_COMMUNITY): Payer: Self-pay

## 2023-10-18 MED ORDER — FLUTICASONE PROPIONATE 50 MCG/ACT NA SUSP
1.0000 | Freq: Every day | NASAL | 0 refills | Status: DC
Start: 2023-10-15 — End: 2024-06-16
  Filled 2023-10-18: qty 16, 60d supply, fill #0

## 2023-11-25 ENCOUNTER — Other Ambulatory Visit: Payer: Self-pay

## 2023-11-25 ENCOUNTER — Other Ambulatory Visit: Payer: Self-pay | Admitting: Nurse Practitioner

## 2023-11-26 ENCOUNTER — Encounter: Payer: Self-pay | Admitting: Pharmacist

## 2023-11-26 ENCOUNTER — Other Ambulatory Visit: Payer: Self-pay

## 2023-12-14 ENCOUNTER — Other Ambulatory Visit (HOSPITAL_COMMUNITY): Payer: Self-pay

## 2023-12-14 ENCOUNTER — Other Ambulatory Visit: Payer: Self-pay

## 2023-12-15 ENCOUNTER — Other Ambulatory Visit: Payer: Self-pay

## 2023-12-20 ENCOUNTER — Other Ambulatory Visit: Payer: Self-pay

## 2023-12-20 ENCOUNTER — Other Ambulatory Visit (HOSPITAL_COMMUNITY): Payer: Self-pay

## 2023-12-20 MED ORDER — BREZTRI AEROSPHERE 160-9-4.8 MCG/ACT IN AERO
2.0000 | INHALATION_SPRAY | Freq: Two times a day (BID) | RESPIRATORY_TRACT | 0 refills | Status: DC
  Filled 2023-12-20: qty 10.7, 30d supply, fill #0

## 2023-12-22 ENCOUNTER — Encounter: Payer: Self-pay | Admitting: Pharmacist

## 2023-12-22 ENCOUNTER — Other Ambulatory Visit: Payer: Self-pay

## 2024-02-01 ENCOUNTER — Other Ambulatory Visit: Payer: Self-pay

## 2024-02-02 ENCOUNTER — Other Ambulatory Visit: Payer: Self-pay

## 2024-02-02 ENCOUNTER — Other Ambulatory Visit (HOSPITAL_COMMUNITY): Payer: Self-pay

## 2024-02-02 MED ORDER — LISINOPRIL 10 MG PO TABS
10.0000 mg | ORAL_TABLET | Freq: Every day | ORAL | 0 refills | Status: DC
  Filled 2024-02-02: qty 90, 90d supply, fill #0

## 2024-02-02 MED ORDER — ALBUTEROL SULFATE HFA 108 (90 BASE) MCG/ACT IN AERS
2.0000 | INHALATION_SPRAY | RESPIRATORY_TRACT | 3 refills | Status: AC | PRN
  Filled 2024-02-02: qty 18, 17d supply, fill #0
  Filled 2024-02-02: qty 6.7, 16d supply, fill #0
  Filled 2024-04-26: qty 18, 17d supply, fill #1
  Filled 2024-06-02: qty 6.7, 25d supply, fill #0
  Filled 2024-06-02: qty 18, 17d supply, fill #2

## 2024-02-02 MED ORDER — BUDESON-GLYCOPYRROL-FORMOTEROL 160-9-4.8 MCG/ACT IN AERO
2.0000 | INHALATION_SPRAY | Freq: Two times a day (BID) | RESPIRATORY_TRACT | 0 refills | Status: DC
  Filled 2024-02-02 – 2024-03-06 (×2): qty 10.7, 30d supply, fill #0

## 2024-02-02 MED ORDER — OMEPRAZOLE 20 MG PO CPDR
20.0000 mg | DELAYED_RELEASE_CAPSULE | Freq: Every day | ORAL | 3 refills | Status: AC
  Filled 2024-02-02 – 2024-04-26 (×2): qty 90, 90d supply, fill #0

## 2024-02-03 ENCOUNTER — Ambulatory Visit (HOSPITAL_COMMUNITY)
Admission: EM | Admit: 2024-02-03 | Discharge: 2024-02-03 | Disposition: A | Discharge status: Home / Self Care | Payer: MEDICAID | Attending: Emergency Medicine | Admitting: Emergency Medicine

## 2024-02-03 ENCOUNTER — Other Ambulatory Visit: Payer: Self-pay

## 2024-02-03 ENCOUNTER — Encounter (HOSPITAL_COMMUNITY): Payer: Self-pay

## 2024-02-03 DIAGNOSIS — T7840XA Allergy, unspecified, initial encounter: Secondary | ICD-10-CM | POA: Diagnosis not present

## 2024-02-03 DIAGNOSIS — T783XXA Angioneurotic edema, initial encounter: Secondary | ICD-10-CM | POA: Diagnosis not present

## 2024-02-03 DIAGNOSIS — T464X5A Adverse effect of angiotensin-converting-enzyme inhibitors, initial encounter: Secondary | ICD-10-CM | POA: Diagnosis not present

## 2024-02-03 DIAGNOSIS — J4521 Mild intermittent asthma with (acute) exacerbation: Secondary | ICD-10-CM | POA: Diagnosis not present

## 2024-02-03 MED ORDER — IPRATROPIUM-ALBUTEROL 0.5-2.5 (3) MG/3ML IN SOLN
RESPIRATORY_TRACT | Status: AC
  Filled 2024-02-03: qty 3

## 2024-02-03 MED ORDER — METHYLPREDNISOLONE SODIUM SUCC 125 MG IJ SOLR
INTRAMUSCULAR | Status: AC
  Filled 2024-02-03: qty 2

## 2024-02-03 MED ORDER — FAMOTIDINE 20 MG PO TABS
20.0000 mg | ORAL_TABLET | Freq: Once | ORAL | Status: AC
  Administered 2024-02-03: 20 mg via ORAL

## 2024-02-03 MED ORDER — IPRATROPIUM-ALBUTEROL 0.5-2.5 (3) MG/3ML IN SOLN
3.0000 mL | Freq: Once | RESPIRATORY_TRACT | Status: AC
  Administered 2024-02-03: 3 mL via RESPIRATORY_TRACT

## 2024-02-03 MED ORDER — FAMOTIDINE 20 MG PO TABS
ORAL_TABLET | ORAL | Status: AC
  Filled 2024-02-03: qty 1

## 2024-02-03 MED ORDER — PREDNISONE 20 MG PO TABS
40.0000 mg | ORAL_TABLET | Freq: Every day | ORAL | 0 refills | Status: AC
  Filled 2024-02-03: qty 10, 5d supply, fill #0

## 2024-02-03 MED ORDER — METHYLPREDNISOLONE SODIUM SUCC 125 MG IJ SOLR
125.0000 mg | Freq: Once | INTRAMUSCULAR | Status: AC
Start: 2024-02-03 — End: 2024-02-03
  Administered 2024-02-03: 125 mg via INTRAMUSCULAR

## 2024-02-03 NOTE — ED Provider Notes (Addendum)
 [facility]    CSN: [id number] Arrival date & time: 02/03/24  1229      History   Chief Complaint Chief Complaint  Patient presents with   Allergic Reaction    HPI Keith Weber is a 53 y.o. male.   Patient presents with lip swelling that began upon waking this morning.  Patient states that he took his first dose of lisinopril last night and woke up this morning with swelling to his lower lip that has now spread to his upper lip.  Patient denies tongue/throat swelling, rash, and itching. Denies dizziness, lightheadedness, or feeling like he might pass out.  Patient states that he does have some chest tightness and indigestion, but denies difficulty breathing.  Patient states that he feels like the swelling is spreading to his face as well.  Patient does have a history of asthma and COPD.  Patient states that he did use his albuterol  inhaler earlier today with some relief of chest tightness.  Patient states that he took 2 tablets of Benadryl  an hour prior to arrival here.    The history is provided by the patient and medical records.  Allergic Reaction   Past Medical History:  Diagnosis Date   Arthritis    Asthma    COPD (chronic obstructive pulmonary disease) (HCC)    GERD (gastroesophageal reflux disease)    Sciatic nerve pain     Patient Active Problem List   Diagnosis Date Noted   Pneumothorax on right 09/09/2019   CAP (community acquired pneumonia) 01/21/2016   Right middle lobe pneumonia 01/21/2016   Glucosuria with normal serum glucose 01/21/2016   Lactic acidosis 01/21/2016   CKD (chronic kidney disease) stage 3, GFR 30-59 ml/min (HCC) 01/21/2016   Cigarette smoker 06/12/2015   COPD pfts pending/ still smoking  06/11/2015   Asthma, chronic 10/16/2014   Closed fracture of right thumb 06/16/2014    Past Surgical History:  Procedure Laterality Date   I & D EXTREMITY Right 06/16/2014   Procedure: IRRIGATION AND DEBRIDEMENT RIGHT THUMB AND FIRST  FINGER AND PINNING ;  Surgeon: Elsie Mussel, MD;  Location: [facility] OR;  Service: Orthopedics;  Laterality: Right;   IRRIGATION AND DEBRIDEMENT KNEE Right 06/16/2014   Procedure: IRRIGATION AND DEBRIDEMENT POSTERIOR KNEE;  Surgeon: Elsie Mussel, MD;  Location: [facility] OR;  Service: Orthopedics;  Laterality: Right;   LACERATION REPAIR Right 06/16/2014   Procedure: REPAIR ODF THREE LACERATIONS POSTERIOR KNEE;  Surgeon: Elsie Mussel, MD;  Location: [facility] OR;  Service: Orthopedics;  Laterality: Right;       Home Medications    Prior to Admission medications   Medication Sig Start Date End Date Taking? Authorizing Provider  predniSONE  (DELTASONE ) 20 MG tablet Take 2 tablets (40 mg total) by mouth daily for 5 days. 02/03/24 02/08/24 Yes Ersa Delaney A, NP  acetaminophen  (TYLENOL ) 500 MG tablet Take 2 tablets (1,000 mg total) by mouth every 6 (six) hours as needed. 09/14/19   Meuth, Lyle LABOR, PA-C  albuterol  (VENTOLIN  HFA) 108 (90 Base) MCG/ACT inhaler Inhale 2 puffs into the lungs every 6 (six) hours as needed for wheezing or shortness of breath. 10/29/22   Delbert Clam, MD  albuterol  (VENTOLIN  HFA) 108 (90 Base) MCG/ACT inhaler Inhale 2 puffs into the lungs every 4 (four) hours as needed for shortness of breath or whezing. 02/01/24     azelastine  (ASTELIN ) 0.1 % nasal spray Place 1 spray into both nostrils 2 (two) times daily. 01/19/23   Newlin, Enobong, MD  betamethasone   dipropionate 0.05 % cream Apply topically 2 (two) times daily. 03/07/20   Theotis Haze ORN, NP  Budeson-Glycopyrrol-Formoterol  (BREZTRI  AEROSPHERE) 160-9-4.8 MCG/ACT AERO Inhale 2 puffs into the lungs in the morning and at bedtime. 08/09/23   Hunsucker, Donnice SAUNDERS, MD  budesonide -glycopyrrolate -formoterol  (BREZTRI ) 160-9-4.8 MCG/ACT AERO inhaler Inhale 2 puffs into the lungs 2 (two) times daily. 02/01/24     docusate sodium  (COLACE) 100 MG capsule Take 1 capsule (100 mg total) by mouth 2 (two) times daily. 09/14/19   Meuth, Brooke A, PA-C   fluticasone  (FLONASE ) 50 MCG/ACT nasal spray Place 1 spray into both nostrils daily. 10/15/23     ibuprofen  (ADVIL ) 800 MG tablet Take 1 tablet (800 mg total) by mouth every 8 (eight) hours as needed. 05/03/22   Rising, Asberry, PA-C  ipratropium-albuterol  (DUONEB) 0.5-2.5 (3) MG/3ML SOLN Inhale 3 mLs by nebulization every 6 (six) hours as needed. 10/29/22   Newlin, Enobong, MD  lidocaine  (LIDODERM ) 5 % Place 1 patch onto the skin daily. Remove & Discard patch within 12 hours or as directed by MD 08/27/22   Nivia Colon, PA-C  lidocaine  (XYLOCAINE ) 2 % solution Use as directed 15 mLs in the mouth or throat as needed for mouth pain. 05/03/22   Rising, Asberry, PA-C  lisinopril (ZESTRIL) 10 MG tablet Take 1 tablet (10 mg total) by mouth daily. 12/29/23     montelukast  (SINGULAIR ) 10 MG tablet Take 1 tablet (10 mg total) by mouth at bedtime. 06/01/22   Hunsucker, Donnice SAUNDERS, MD  Multiple Vitamin (MULTIVITAMIN WITH MINERALS) TABS tablet Take 1 tablet by mouth daily. 05/19/22   Celestia Rosaline SQUIBB, NP  omeprazole  (PRILOSEC) 20 MG capsule Take 1 capsule (20 mg total) by mouth daily.Must have office visit for refills 10/15/23     omeprazole  (PRILOSEC) 20 MG capsule Take 1 capsule (20 mg total) by mouth daily before breakfast. 02/01/24     sodium chloride  (OCEAN) 0.65 % SOLN nasal spray Place 1 spray into both nostrils as needed for congestion. 09/14/19   Meuth, Brooke A, PA-C  Zoster Vaccine Adjuvanted (SHINGRIX ) injection Inject 0.5 ml into the muscle for 1 dose. 08/07/22   Theotis Haze ORN, NP    Family History Family History  Family history unknown: Yes    Social History Social History   Tobacco Use   Smoking status: Some Days    Current packs/day: 0.50    Average packs/day: 0.5 packs/day for 31.0 years (15.5 ttl pk-yrs)    Types: Cigarettes   Smokeless tobacco: Never   Tobacco comments:    Pt states he smoke 1/2 ppd. ALS 05/14/22  Vaping Use   Vaping status: Never Used  Substance Use Topics    Alcohol use: Not Currently    Comment: occasionally   Drug use: Not Currently    Types: Marijuana     Allergies   Aspirin, Lisinopril, Peanut-containing drug products, Shellfish allergy, Amoxicillin, Clindamycin /lincomycin, Iodine, Kiwi extract, Naproxen , Tramadol , and Latex   Review of Systems Review of Systems  Per HPI  Physical Exam Triage Vital Signs ED Triage Vitals  Encounter Vitals Group     BP 02/03/24 1247 (!) 145/88     Girls Systolic BP Percentile --      Girls Diastolic BP Percentile --      Boys Systolic BP Percentile --      Boys Diastolic BP Percentile --      Pulse Rate 02/03/24 1247 70     Resp 02/03/24 1247 18     Temp 02/03/24  1247 98.4 F (36.9 C)     Temp Source 02/03/24 1247 Oral     SpO2 02/03/24 1247 96 %     Weight --      Height --      Head Circumference --      Peak Flow --      Pain Score 02/03/24 1248 0     Pain Loc --      Pain Education --      Exclude from Growth Chart --    No data found.  Updated Vital Signs BP (!) 145/88 (BP Location: Right Arm)   Pulse 70   Temp 98.4 F (36.9 C) (Oral)   Resp 18   SpO2 96%   Visual Acuity Right Eye Distance:   Left Eye Distance:   Bilateral Distance:    Right Eye Near:   Left Eye Near:    Bilateral Near:     Physical Exam Vitals and nursing note reviewed.  Constitutional:      General: He is awake. He is not in acute distress.    Appearance: Normal appearance. He is well-developed and well-groomed. He is not ill-appearing.  HENT:     Mouth/Throat:     Mouth: Angioedema present.     Pharynx: Oropharynx is clear.     Comments: Upper and lower angioedema noted.  Without obvious tongue or throat swelling present.  Cardiovascular:     Rate and Rhythm: Normal rate and regular rhythm.  Pulmonary:     Effort: Pulmonary effort is normal.     Breath sounds: No stridor. Wheezing present.   Skin:    General: Skin is warm and dry.   Neurological:     Mental Status: He is alert.    Psychiatric:        Behavior: Behavior is cooperative.      UC Treatments / Results  Labs (all labs ordered are listed, but only abnormal results are displayed) Labs Reviewed - No data to display  EKG   Radiology No results found.  Procedures Procedures (including critical care time)  Medications Ordered in UC Medications  ipratropium-albuterol  (DUONEB) 0.5-2.5 (3) MG/3ML nebulizer solution 3 mL (3 mLs Nebulization Given 02/03/24 1257)  methylPREDNISolone  sodium succinate (SOLU-MEDROL ) 125 mg/2 mL injection 125 mg (125 mg Intramuscular Given 02/03/24 1257)  famotidine  (PEPCID ) tablet 20 mg (20 mg Oral Given 02/03/24 1257)    Initial Impression / Assessment and Plan / UC Course  I have reviewed the triage vital signs and the nursing notes.  Pertinent labs & imaging results that were available during my care of the patient were reviewed by me and considered in my medical decision making (see chart for details).     Patient is overall well-appearing and does not appear to be in any acute distress.  Upon assessment upper and lower angioedema noted.  Without obvious tongue or throat involvement.  Patient is not tachypneic but upon auscultation there is inspiratory and expiratory wheezing auscultated throughout all lung fields.  DuoNeb and Solu-Medrol  given with relief of wheezing and symptomatic relief of chest tightness.  Patient also given Pepcid .  Patient took Benadryl  prior to arrival so deferred giving additional Benadryl  at this time.  Angioedema likely related to just starting lisinopril.  Recommended patient stop taking lisinopril.  Difficult to determine if this exacerbated his asthma or if patient was already having some underlying issues with asthma.  Prescribed prednisone  burst for asthma exacerbation and additional relief of swelling.  Discussed over-the-counter antihistamine use.  Discussed when to use albuterol  inhaler and nebulizers.  Discussed follow-up, return,  and strict ER precautions. Final Clinical Impressions(s) / UC Diagnoses   Final diagnoses:  Allergic reaction, initial encounter  Mild intermittent asthma with acute exacerbation  Angiotensin converting enzyme inhibitor-aggravated angioedema, initial encounter     Discharge Instructions      Stop taking lisinopril!! Tomorrow start taking 2 tablets of prednisone  for 5 days this will help reduce swelling and assist with asthma exacerbation. Use albuterol  inhaler and nebulizers every 6 hours as needed for shortness of breath, wheezing, and chest tightness. You can continue taking 25 mg of Benadryl  every 6-8 hours to help with swelling.  This can make you drowsy so do not drive, work, or drink alcohol while taking this. Keep your follow-up appointment with your primary care provider tomorrow morning. If you develop worsening swelling, tongue swelling, throat swelling, difficulty breathing, or full-body hives please seek immediate medical treatment in the emergency department.     ED Prescriptions     Medication Sig Dispense Auth. Provider   predniSONE  (DELTASONE ) 20 MG tablet Take 2 tablets (40 mg total) by mouth daily for 5 days. 10 tablet Johnie Flaming A, NP      PDMP not reviewed this encounter.   Johnie Flaming LABOR, NP 02/03/24 1326    Johnie Flaming A, NP 02/03/24 [phone]

## 2024-02-03 NOTE — Discharge Instructions (Addendum)
 Stop taking lisinopril!! Tomorrow start taking 2 tablets of prednisone  for 5 days this will help reduce swelling and assist with asthma exacerbation. Use albuterol  inhaler and nebulizers every 6 hours as needed for shortness of breath, wheezing, and chest tightness. You can continue taking 25 mg of Benadryl  every 6-8 hours to help with swelling.  This can make you drowsy so do not drive, work, or drink alcohol while taking this. Keep your follow-up appointment with your primary care provider tomorrow morning. If you develop worsening swelling, tongue swelling, throat swelling, difficulty breathing, or full-body hives please seek immediate medical treatment in the emergency department.

## 2024-02-03 NOTE — ED Triage Notes (Signed)
 Pt with lip and facial swelling. Patient states he started lisinopril last night and woke up this morning with the swelling. Patient last took a benadryl  about an hour PTA. Denies difficulty breathing but states the swelling is moving into the rest of his face.

## 2024-02-04 ENCOUNTER — Other Ambulatory Visit: Payer: Self-pay

## 2024-02-04 MED ORDER — OLOPATADINE HCL 0.2 % OP SOLN
1.0000 [drp] | Freq: Every day | OPHTHALMIC | 11 refills | Status: AC
  Filled 2024-02-04: qty 2.5, 25d supply, fill #0
  Filled 2024-03-06: qty 2.5, 25d supply, fill #1

## 2024-02-04 MED ORDER — NIFEDIPINE ER 30 MG PO TB24
30.0000 mg | ORAL_TABLET | Freq: Every day | ORAL | 0 refills | Status: DC
  Filled 2024-02-04: qty 90, 90d supply, fill #0

## 2024-02-07 ENCOUNTER — Other Ambulatory Visit: Payer: Self-pay

## 2024-02-08 ENCOUNTER — Other Ambulatory Visit: Payer: Self-pay

## 2024-02-21 ENCOUNTER — Encounter (HOSPITAL_COMMUNITY): Payer: Self-pay | Admitting: Emergency Medicine

## 2024-02-21 ENCOUNTER — Ambulatory Visit (HOSPITAL_COMMUNITY)
Admission: EM | Admit: 2024-02-21 | Discharge: 2024-02-21 | Disposition: A | Discharge status: Home / Self Care | Payer: MEDICAID | Attending: Internal Medicine | Admitting: Internal Medicine

## 2024-02-21 ENCOUNTER — Other Ambulatory Visit: Payer: Self-pay

## 2024-02-21 DIAGNOSIS — T63441A Toxic effect of venom of bees, accidental (unintentional), initial encounter: Secondary | ICD-10-CM | POA: Diagnosis not present

## 2024-02-21 DIAGNOSIS — H02841 Edema of right upper eyelid: Secondary | ICD-10-CM | POA: Diagnosis not present

## 2024-02-21 MED ORDER — FAMOTIDINE 20 MG PO TABS
20.0000 mg | ORAL_TABLET | Freq: Every day | ORAL | 0 refills | Status: AC
  Filled 2024-02-21: qty 7, 7d supply, fill #0

## 2024-02-21 MED ORDER — DEXAMETHASONE SODIUM PHOSPHATE 10 MG/ML IJ SOLN
10.0000 mg | Freq: Once | INTRAMUSCULAR | Status: AC
Start: 2024-02-21 — End: 2024-02-21
  Administered 2024-02-21: 10 mg via INTRAMUSCULAR

## 2024-02-21 MED ORDER — CETIRIZINE HCL 10 MG PO TABS
10.0000 mg | ORAL_TABLET | Freq: Every day | ORAL | 0 refills | Status: AC
  Filled 2024-02-21 – 2024-03-06 (×2): qty 7, 7d supply, fill #0

## 2024-02-21 MED ORDER — DEXAMETHASONE SODIUM PHOSPHATE 10 MG/ML IJ SOLN
INTRAMUSCULAR | Status: AC
  Filled 2024-02-21: qty 1

## 2024-02-21 NOTE — ED Triage Notes (Signed)
 Pt reports got stung by bee on right eye Saturday. Taken Benadryl  and swelling not getting better of right side of face

## 2024-02-21 NOTE — Discharge Instructions (Addendum)
 You have been evaluated today for an allergic reaction. We gave you medicine to help with symptoms.   Provide warm compresses with a clean wash cloth to the right upper eyelid to reduce swelling.  We gave you a shot of steroid in the clinic today to help to further reduce the allergic reaction.  Take medications sent to pharmacy as directed.  You may take an over the counter antihistamine (Claritin or Zyrtec ) for the next 5-7 days as well as a medication called famotidine  (Pepcid ) over the counter 20mg  daily for 5-7 days to further reduce your symptoms.   Please schedule an appointment with your primary care provider for follow-up and ongoing management. Return if you experience rashes, difficulty breathing or swallowing, lip/mouth/tongue swelling, vomiting, or for any other concerning symptoms. If symptoms are severe, please go to the ER for further workup.

## 2024-02-21 NOTE — ED Provider Notes (Signed)
 [facility]    CSN: [id number] Arrival date & time: 02/21/24  0851      History   Chief Complaint Chief Complaint  Patient presents with   Insect Bite    HPI Keith Weber is a 53 y.o. male.   Patient presents to urgent care for evaluation of bee sting to the right eyelid that happened 2 days ago on Saturday, February 19, 2024.  The right eyelid became swollen and itchy after the bee sting.  No surrounding hives or significant facial swelling as a result of bee sting.  Denies tongue swelling, throat swelling, shortness of breath, fever, chills, hives/rash, dizziness, vision changes, and headache.  He presents today due to persistent swelling of the right upper eyelid and concern for persistent allergic reaction. He was recently treated with prednisone  burst and steroid injection urgent care due to allergic reaction to lisinopril  (angioedema) 2.5 weeks ago on February 03, 2024.  Denies other recent steroid use.  History of multiple medication/food allergies including anaphylaxis with peanuts, aspirin, and shellfish.  Denies recent exposures to these allergens.  He has been taking Benadryl  with significant improvement in swelling and itching of the right eyelid.     Past Medical History:  Diagnosis Date   Arthritis    Asthma    COPD (chronic obstructive pulmonary disease) (HCC)    GERD (gastroesophageal reflux disease)    Sciatic nerve pain     Patient Active Problem List   Diagnosis Date Noted   Pneumothorax on right 09/09/2019   CAP (community acquired pneumonia) 01/21/2016   Right middle lobe pneumonia 01/21/2016   Glucosuria with normal serum glucose 01/21/2016   Lactic acidosis 01/21/2016   CKD (chronic kidney disease) stage 3, GFR 30-59 ml/min (HCC) 01/21/2016   Cigarette smoker 06/12/2015   COPD pfts pending/ still smoking  06/11/2015   Asthma, chronic 10/16/2014   Closed fracture of right thumb 06/16/2014    Past Surgical History:  Procedure Laterality  Date   I & D EXTREMITY Right 06/16/2014   Procedure: IRRIGATION AND DEBRIDEMENT RIGHT THUMB AND FIRST FINGER AND PINNING ;  Surgeon: Elsie Mussel, MD;  Location: [facility];  Service: Orthopedics;  Laterality: Right;   IRRIGATION AND DEBRIDEMENT KNEE Right 06/16/2014   Procedure: IRRIGATION AND DEBRIDEMENT POSTERIOR KNEE;  Surgeon: Elsie Mussel, MD;  Location: [facility];  Service: Orthopedics;  Laterality: Right;   LACERATION REPAIR Right 06/16/2014   Procedure: REPAIR ODF THREE LACERATIONS POSTERIOR KNEE;  Surgeon: Elsie Mussel, MD;  Location: [facility];  Service: Orthopedics;  Laterality: Right;       Home Medications    Prior to Admission medications   Medication Sig Start Date End Date Taking? Authorizing Provider  cetirizine  (ZYRTEC ) 10 MG tablet Take 1 tablet (10 mg total) by mouth at bedtime for 7 days. 02/21/24 02/28/24 Yes Enedelia Dorna HERO, FNP  famotidine  (PEPCID ) 20 MG tablet Take 1 tablet (20 mg total) by mouth at bedtime for 7 days. 02/21/24 02/28/24 Yes Enedelia Dorna HERO, FNP  acetaminophen  (TYLENOL ) 500 MG tablet Take 2 tablets (1,000 mg total) by mouth every 6 (six) hours as needed. 09/14/19   Meuth, Lyle LABOR, PA-C  albuterol  (VENTOLIN  HFA) 108 (90 Base) MCG/ACT inhaler Inhale 2 puffs into the lungs every 6 (six) hours as needed for wheezing or shortness of breath. 10/29/22   Delbert Clam, MD  albuterol  (VENTOLIN  HFA) 108 (90 Base) MCG/ACT inhaler Inhale 2 puffs into the lungs every 4 (four) hours as needed for shortness  of breath or whezing. 02/01/24     azelastine  (ASTELIN ) 0.1 % nasal spray Place 1 spray into both nostrils 2 (two) times daily. 01/19/23   Newlin, Enobong, MD  betamethasone  dipropionate 0.05 % cream Apply topically 2 (two) times daily. 03/07/20   Fleming, Zelda W, NP  Budeson-Glycopyrrol-Formoterol  (BREZTRI  AEROSPHERE) 160-9-4.8 MCG/ACT AERO Inhale 2 puffs into the lungs in the morning and at bedtime. 08/09/23   Hunsucker, Donnice SAUNDERS, MD   budesonide -glycopyrrolate -formoterol  (BREZTRI ) 160-9-4.8 MCG/ACT AERO inhaler Inhale 2 puffs into the lungs 2 (two) times daily. 02/01/24     docusate sodium  (COLACE) 100 MG capsule Take 1 capsule (100 mg total) by mouth 2 (two) times daily. 09/14/19   Meuth, Brooke A, PA-C  fluticasone  (FLONASE ) 50 MCG/ACT nasal spray Place 1 spray into both nostrils daily. 10/15/23     ibuprofen  (ADVIL ) 800 MG tablet Take 1 tablet (800 mg total) by mouth every 8 (eight) hours as needed. 05/03/22   Rising, Asberry, PA-C  ipratropium-albuterol  (DUONEB) 0.5-2.5 (3) MG/3ML SOLN Inhale 3 mLs by nebulization every 6 (six) hours as needed. 10/29/22   Newlin, Enobong, MD  lidocaine  (LIDODERM ) 5 % Place 1 patch onto the skin daily. Remove & Discard patch within 12 hours or as directed by MD 08/27/22   Nivia Colon, PA-C  lidocaine  (XYLOCAINE ) 2 % solution Use as directed 15 mLs in the mouth or throat as needed for mouth pain. 05/03/22   Rising, Asberry, PA-C  lisinopril  (ZESTRIL ) 10 MG tablet Take 1 tablet (10 mg total) by mouth daily. 12/29/23     montelukast  (SINGULAIR ) 10 MG tablet Take 1 tablet (10 mg total) by mouth at bedtime. 06/01/22   Hunsucker, Donnice SAUNDERS, MD  Multiple Vitamin (MULTIVITAMIN WITH MINERALS) TABS tablet Take 1 tablet by mouth daily. 05/19/22   Celestia Rosaline SQUIBB, NP  NIFEdipine  (ADALAT  CC) 30 MG 24 hr tablet Take 1 tablet (30 mg total) by mouth daily. 02/04/24     Olopatadine  HCl 0.2 % SOLN Place 1 drop into both eyes daily. 02/04/24     omeprazole  (PRILOSEC) 20 MG capsule Take 1 capsule (20 mg total) by mouth daily.Must have office visit for refills 10/15/23     omeprazole  (PRILOSEC) 20 MG capsule Take 1 capsule (20 mg total) by mouth daily before breakfast. 02/01/24     sodium chloride  (OCEAN) 0.65 % SOLN nasal spray Place 1 spray into both nostrils as needed for congestion. 09/14/19   Meuth, Brooke A, PA-C  Zoster Vaccine Adjuvanted (SHINGRIX ) injection Inject 0.5 ml into the muscle for 1 dose. 08/07/22   Theotis Haze ORN, NP    Family History Family History  Family history unknown: Yes    Social History Social History   Tobacco Use   Smoking status: Some Days    Current packs/day: 0.50    Average packs/day: 0.5 packs/day for 31.0 years (15.5 ttl pk-yrs)    Types: Cigarettes   Smokeless tobacco: Never   Tobacco comments:    Pt states he smoke 1/2 ppd. ALS 05/14/22  Vaping Use   Vaping status: Never Used  Substance Use Topics   Alcohol use: Not Currently    Comment: occasionally   Drug use: Not Currently    Types: Marijuana     Allergies   Aspirin, Lisinopril , Peanut-containing drug products, Shellfish allergy, Amoxicillin, Clindamycin /lincomycin, Iodine, Kiwi extract, Naproxen , Tramadol , and Latex   Review of Systems Review of Systems Per HPI  Physical Exam Triage Vital Signs ED Triage Vitals  Encounter Vitals Group  BP 02/21/24 0903 135/78     Girls Systolic BP Percentile --      Girls Diastolic BP Percentile --      Boys Systolic BP Percentile --      Boys Diastolic BP Percentile --      Pulse Rate 02/21/24 0903 81     Resp 02/21/24 0903 17     Temp 02/21/24 0903 97.8 F (36.6 C)     Temp Source 02/21/24 0903 Oral     SpO2 02/21/24 0903 98 %     Weight --      Height --      Head Circumference --      Peak Flow --      Pain Score 02/21/24 0901 0     Pain Loc --      Pain Education --      Exclude from Growth Chart --    No data found.  Updated Vital Signs BP 135/78 (BP Location: Right Arm)   Pulse 81   Temp 97.8 F (36.6 C) (Oral)   Resp 17   SpO2 98%   Visual Acuity Right Eye Distance:   Left Eye Distance:   Bilateral Distance:    Right Eye Near:   Left Eye Near:    Bilateral Near:     Physical Exam Vitals and nursing note reviewed.  Constitutional:      Appearance: He is not ill-appearing or toxic-appearing.  HENT:     Head: Normocephalic and atraumatic.     Right Ear: Hearing, tympanic membrane, ear canal and external ear normal.      Left Ear: Hearing, tympanic membrane, ear canal and external ear normal.     Nose: Nose normal.     Mouth/Throat:     Lips: Pink.     Mouth: Mucous membranes are moist. No injury or oral lesions.     Dentition: Normal dentition.     Tongue: No lesions.     Pharynx: Oropharynx is clear. Uvula midline. No pharyngeal swelling, oropharyngeal exudate, posterior oropharyngeal erythema, uvula swelling or postnasal drip.     Tonsils: No tonsillar exudate.  Eyes:     General: Lids are normal. Vision grossly intact. Gaze aligned appropriately.     Extraocular Movements: Extraocular movements intact.     Conjunctiva/sclera: Conjunctivae normal.     Right eye: Right conjunctiva is not injected. No chemosis, exudate or hemorrhage.    Left eye: Left conjunctiva is not injected. No chemosis, exudate or hemorrhage.    Pupils: Pupils are equal, round, and reactive to light.     Visual Fields: Right eye visual fields normal and left eye visual fields normal.     Comments: Localized swelling over the right eyelid, see image below.  EOMs intact without pain or dizziness elicited.  Nontender to palpation over the right eyelid.  No signs of preseptal cellulitis  Neck:     Trachea: Trachea and phonation normal.  Cardiovascular:     Rate and Rhythm: Normal rate and regular rhythm.     Heart sounds: Normal heart sounds, S1 normal and S2 normal.  Pulmonary:     Effort: Pulmonary effort is normal. No respiratory distress.     Breath sounds: Normal breath sounds and air entry.  Musculoskeletal:     Cervical back: Neck supple.  Lymphadenopathy:     Cervical: No cervical adenopathy.  Skin:    General: Skin is warm and dry.     Capillary Refill: Capillary refill takes less than  2 seconds.     Findings: No rash.  Neurological:     General: No focal deficit present.     Mental Status: He is alert and oriented to person, place, and time. Mental status is at baseline.     Cranial Nerves: No dysarthria or facial  asymmetry.  Psychiatric:        Mood and Affect: Mood normal.        Speech: Speech normal.        Behavior: Behavior normal.        Thought Content: Thought content normal.        Judgment: Judgment normal.      UC Treatments / Results  Labs (all labs ordered are listed, but only abnormal results are displayed) Labs Reviewed - No data to display  EKG   Radiology No results found.  Procedures Procedures (including critical care time)  Medications Ordered in UC Medications  dexamethasone  (DECADRON ) injection 10 mg (10 mg Intramuscular Given 02/21/24 0919)    Initial Impression / Assessment and Plan / UC Course  I have reviewed the triage vital signs and the nursing notes.  Pertinent labs & imaging results that were available during my care of the patient were reviewed by me and considered in my medical decision making (see chart for details).   1.  Allergic reaction to bee sting, swelling of right upper eyelid Presentation consistent with acute localized allergic reaction/soft tissue swelling secondary to bee sting with delayed presentation. No signs of anaphylaxis, HEENT exam stable, lungs clear.  Will treat with dexamethasone  10 mg IM here in the clinic and use of antihistamines/H2 blocker (famotidine ) at home.  No current signs of secondary bacterial infection and further ocular involvement.  Recommend supportive care for symptomatic relief as outlined in AVS.    Advised to avoid known and potential allergens. Follow-up with PCP and/or allergy specialist.   Counseled patient on potential for adverse effects with medications prescribed/recommended today, strict ER and return-to-clinic precautions discussed, patient verbalized understanding.    Final Clinical Impressions(s) / UC Diagnoses   Final diagnoses:  Allergic reaction to bee sting  Swelling of right upper eyelid     Discharge Instructions      You have been evaluated today for an allergic reaction.  We gave you medicine to help with symptoms.   Provide warm compresses with a clean wash cloth to the right upper eyelid to reduce swelling.  We gave you a shot of steroid in the clinic today to help to further reduce the allergic reaction.  Take medications sent to pharmacy as directed.  You may take an over the counter antihistamine (Claritin or Zyrtec ) for the next 5-7 days as well as a medication called famotidine  (Pepcid ) over the counter 20mg  daily for 5-7 days to further reduce your symptoms.   Please schedule an appointment with your primary care provider for follow-up and ongoing management. Return if you experience rashes, difficulty breathing or swallowing, lip/mouth/tongue swelling, vomiting, or for any other concerning symptoms. If symptoms are severe, please go to the ER for further workup.      ED Prescriptions     Medication Sig Dispense Auth. Provider   famotidine  (PEPCID ) 20 MG tablet Take 1 tablet (20 mg total) by mouth at bedtime for 7 days. 7 tablet Enedelia Dorna HERO, FNP   cetirizine  (ZYRTEC ) 10 MG tablet Take 1 tablet (10 mg total) by mouth at bedtime for 7 days. 7 tablet Enedelia Dorna HERO, FNP  PDMP not reviewed this encounter.   Enedelia Going [place], [place] 02/21/24 [phone]

## 2024-03-06 ENCOUNTER — Other Ambulatory Visit: Payer: Self-pay | Admitting: Nurse Practitioner

## 2024-03-06 ENCOUNTER — Other Ambulatory Visit: Payer: Self-pay

## 2024-03-14 ENCOUNTER — Other Ambulatory Visit: Payer: Self-pay | Admitting: Nurse Practitioner

## 2024-03-14 ENCOUNTER — Other Ambulatory Visit: Payer: Self-pay

## 2024-03-15 ENCOUNTER — Other Ambulatory Visit: Payer: Self-pay

## 2024-03-15 ENCOUNTER — Other Ambulatory Visit (HOSPITAL_COMMUNITY): Payer: Self-pay

## 2024-03-22 ENCOUNTER — Encounter (HOSPITAL_COMMUNITY): Payer: Self-pay | Admitting: *Deleted

## 2024-03-22 ENCOUNTER — Other Ambulatory Visit: Payer: Self-pay

## 2024-03-22 ENCOUNTER — Ambulatory Visit (HOSPITAL_COMMUNITY)
Admission: EM | Admit: 2024-03-22 | Discharge: 2024-03-22 | Disposition: A | Discharge status: Home / Self Care | Attending: Internal Medicine | Admitting: Internal Medicine

## 2024-03-22 DIAGNOSIS — R109 Unspecified abdominal pain: Secondary | ICD-10-CM | POA: Diagnosis not present

## 2024-03-22 LAB — POCT URINALYSIS DIP (MANUAL ENTRY)
Bilirubin, UA: NEGATIVE
Blood, UA: NEGATIVE
Glucose, UA: NEGATIVE mg/dL
Ketones, POC UA: NEGATIVE mg/dL
Leukocytes, UA: NEGATIVE
Nitrite, UA: NEGATIVE
Protein Ur, POC: NEGATIVE mg/dL
Spec Grav, UA: 1.015 (ref 1.010–1.025)
Urobilinogen, UA: 0.2 U/dL
pH, UA: 7 (ref 5.0–8.0)

## 2024-03-22 MED ORDER — BUDESON-GLYCOPYRROL-FORMOTEROL 160-9-4.8 MCG/ACT IN AERO
2.0000 | INHALATION_SPRAY | Freq: Two times a day (BID) | RESPIRATORY_TRACT | 0 refills | Status: AC
  Filled 2024-03-22: qty 10.7, 30d supply, fill #0

## 2024-03-22 NOTE — ED Triage Notes (Addendum)
 C/O constant right lateral abd pain onset 1 wk ago. States has to lay supine due to pain; pain worse with movement. Denies n/v. Denies constipation - states last BM yesterday and reports as normal. Also requesting refill for Breztri  inhaler; states has been having some wheezing over past couple wks and has been using neb.

## 2024-03-22 NOTE — ED Provider Notes (Signed)
 [facility]    CSN: [id number] Arrival date & time: 03/22/24  1020      History   Chief Complaint Chief Complaint  Patient presents with   Abdominal Pain   Wheezing    HPI Keith Weber is a 53 y.o. male who presents today endorsing a 1 week history of constant right flank pain.  He was putting down new flooring in his home the day prior to the onset of pain but is otherwise unaware of any acute event or inciting trauma.  Pain is worse with sitting up, alleviated by laying down.  He took ibuprofen  several days ago, which seemed to help his pain.  Denies additional symptoms currently including nausea/vomiting, fever/chills, dysuria/hematuria, diarrhea, hematochezia, melena, and rectal pain.  Additionally, he requested a refill of Breztri , which he is prescribed for a documented history of COPD.    Past Medical History:  Diagnosis Date   Arthritis    Asthma    COPD (chronic obstructive pulmonary disease) (HCC)    GERD (gastroesophageal reflux disease)    Sciatic nerve pain     Patient Active Problem List   Diagnosis Date Noted   Pneumothorax on right 09/09/2019   CAP (community acquired pneumonia) 01/21/2016   Right middle lobe pneumonia 01/21/2016   Glucosuria with normal serum glucose 01/21/2016   Lactic acidosis 01/21/2016   CKD (chronic kidney disease) stage 3, GFR 30-59 ml/min (HCC) 01/21/2016   Cigarette smoker 06/12/2015   COPD pfts pending/ still smoking  06/11/2015   Asthma, chronic 10/16/2014   Closed fracture of right thumb 06/16/2014    Past Surgical History:  Procedure Laterality Date   I & D EXTREMITY Right 06/16/2014   Procedure: IRRIGATION AND DEBRIDEMENT RIGHT THUMB AND FIRST FINGER AND PINNING ;  Surgeon: Elsie Mussel, MD;  Location: [facility];  Service: Orthopedics;  Laterality: Right;   IRRIGATION AND DEBRIDEMENT KNEE Right 06/16/2014   Procedure: IRRIGATION AND DEBRIDEMENT POSTERIOR KNEE;  Surgeon: Elsie Mussel, MD;  Location: [facility];   Service: Orthopedics;  Laterality: Right;   LACERATION REPAIR Right 06/16/2014   Procedure: REPAIR ODF THREE LACERATIONS POSTERIOR KNEE;  Surgeon: Elsie Mussel, MD;  Location: [facility];  Service: Orthopedics;  Laterality: Right;       Home Medications    Prior to Admission medications   Medication Sig Start Date End Date Taking? Authorizing Provider  albuterol  (2.5 MG/3ML) 0.083% NEBU 3 mL, albuterol  (5 MG/ML) 0.5% NEBU 0.5 mL Inhale into the lungs.   Yes [provider]  albuterol  (VENTOLIN  HFA) 108 (90 Base) MCG/ACT inhaler Inhale 2 puffs into the lungs every 4 (four) hours as needed for shortness of breath or whezing. 02/01/24  Yes   famotidine  (PEPCID ) 20 MG tablet Take 1 tablet (20 mg total) by mouth at bedtime for 7 days. 02/21/24 03/22/24 Yes StanhopeDorna HERO, FNP  ipratropium-albuterol  (DUONEB) 0.5-2.5 (3) MG/3ML SOLN Inhale 3 mLs by nebulization every 6 (six) hours as needed. 10/29/22  Yes Newlin, Enobong, MD  montelukast  (SINGULAIR ) 10 MG tablet Take 1 tablet (10 mg total) by mouth at bedtime. 06/01/22  Yes Hunsucker, Donnice SAUNDERS, MD  Multiple Vitamin (MULTIVITAMIN WITH MINERALS) TABS tablet Take 1 tablet by mouth daily. 05/19/22  Yes Celestia Rosaline SQUIBB, NP  NIFEdipine  (ADALAT  CC) 30 MG 24 hr tablet Take 1 tablet (30 mg total) by mouth daily. 02/04/24  Yes   omeprazole  (PRILOSEC) 20 MG capsule Take 1 capsule (20 mg total) by mouth daily before breakfast. 02/01/24  Yes  acetaminophen  (TYLENOL ) 500 MG tablet Take 2 tablets (1,000 mg total) by mouth every 6 (six) hours as needed. 09/14/19   Meuth, Lyle LABOR, PA-C  albuterol  (VENTOLIN  HFA) 108 (90 Base) MCG/ACT inhaler Inhale 2 puffs into the lungs every 6 (six) hours as needed for wheezing or shortness of breath. 10/29/22   Delbert Clam, MD  azelastine  (ASTELIN ) 0.1 % nasal spray Place 1 spray into both nostrils 2 (two) times daily. 01/19/23   Newlin, Enobong, MD  betamethasone  dipropionate 0.05 % cream Apply topically 2 (two) times  daily. 03/07/20   Fleming, Zelda W, NP  budesonide -glycopyrrolate -formoterol  (BREZTRI ) 160-9-4.8 MCG/ACT AERO inhaler Inhale 2 puffs into the lungs 2 (two) times daily. 03/22/24   Melvenia Manus BRAVO, MD  cetirizine  (ZYRTEC ) 10 MG tablet Take 1 tablet (10 mg total) by mouth at bedtime for 7 days. 02/21/24 02/28/24  Enedelia Dorna HERO, FNP  docusate sodium  (COLACE) 100 MG capsule Take 1 capsule (100 mg total) by mouth 2 (two) times daily. 09/14/19   Meuth, Brooke A, PA-C  fluticasone  (FLONASE ) 50 MCG/ACT nasal spray Place 1 spray into both nostrils daily. 10/15/23     ibuprofen  (ADVIL ) 800 MG tablet Take 1 tablet (800 mg total) by mouth every 8 (eight) hours as needed. 05/03/22   Rising, Asberry, PA-C  lidocaine  (LIDODERM ) 5 % Place 1 patch onto the skin daily. Remove & Discard patch within 12 hours or as directed by MD 08/27/22   Nivia Colon, PA-C  lidocaine  (XYLOCAINE ) 2 % solution Use as directed 15 mLs in the mouth or throat as needed for mouth pain. 05/03/22   Rising, Asberry, PA-C  lisinopril  (ZESTRIL ) 10 MG tablet Take 1 tablet (10 mg total) by mouth daily. 12/29/23     Olopatadine  HCl 0.2 % SOLN Place 1 drop into both eyes daily. 02/04/24     omeprazole  (PRILOSEC) 20 MG capsule Take 1 capsule (20 mg total) by mouth daily.Must have office visit for refills 10/15/23     sodium chloride  (OCEAN) 0.65 % SOLN nasal spray Place 1 spray into both nostrils as needed for congestion. 09/14/19   Meuth, Brooke A, PA-C  Zoster Vaccine Adjuvanted (SHINGRIX ) injection Inject 0.5 ml into the muscle for 1 dose. 08/07/22   Theotis Haze ORN, NP    Family History Family History  Family history unknown: Yes    Social History Social History   Tobacco Use   Smoking status: Some Days    Current packs/day: 0.50    Average packs/day: 0.5 packs/day for 31.0 years (15.5 ttl pk-yrs)    Types: Cigarettes   Smokeless tobacco: Never   Tobacco comments:    Pt states he smoke 1/2 ppd. ALS 05/14/22  Vaping Use   Vaping status:  Never Used  Substance Use Topics   Alcohol use: Yes    Alcohol/week: 12.0 standard drinks of alcohol    Types: 12 Cans of beer per week   Drug use: Not Currently    Types: Marijuana     Allergies   Aspirin, Lisinopril , Peanut-containing drug products, Shellfish allergy, Amoxicillin, Clindamycin /lincomycin, Iodine, Kiwi extract, Naproxen , Tramadol , and Latex   Review of Systems Review of Systems  Constitutional:  Negative for chills and fever.  Respiratory:  Positive for wheezing.   Gastrointestinal:  Positive for abdominal pain. Negative for abdominal distention, constipation, diarrhea, nausea, rectal pain and vomiting.  Genitourinary:  Positive for flank pain. Negative for dysuria, hematuria, penile pain and testicular pain.  Musculoskeletal:  Negative for back pain.  Skin:  Negative  for rash and wound.     Physical Exam Triage Vital Signs ED Triage Vitals  Encounter Vitals Group     BP 03/22/24 1038 136/84     Girls Systolic BP Percentile --      Girls Diastolic BP Percentile --      Boys Systolic BP Percentile --      Boys Diastolic BP Percentile --      Pulse Rate 03/22/24 1038 78     Resp 03/22/24 1038 18     Temp 03/22/24 1038 98.1 F (36.7 C)     Temp src --      SpO2 03/22/24 1038 92 %     Weight --      Height --      Head Circumference --      Peak Flow --      Pain Score 03/22/24 1040 0     Pain Loc --      Pain Education --      Exclude from Growth Chart --    No data found.  Updated Vital Signs BP 136/84   Pulse 78   Temp 98.1 F (36.7 C)   Resp 18   SpO2 95%   Physical Exam Vitals reviewed.  Constitutional:      General: He is not in acute distress.    Appearance: He is well-developed. He is not toxic-appearing.  HENT:     Head: Normocephalic and atraumatic.     Mouth/Throat:     Mouth: Mucous membranes are moist.     Pharynx: Oropharynx is clear.  Eyes:     Extraocular Movements: Extraocular movements intact.     Pupils: Pupils are  equal, round, and reactive to light.  Cardiovascular:     Rate and Rhythm: Normal rate and regular rhythm.     Heart sounds: Normal heart sounds. No murmur heard.    No friction rub. No gallop.  Pulmonary:     Effort: Pulmonary effort is normal.     Breath sounds: Wheezing (Diffuse expiratory wheezes) present.  Abdominal:     General: Abdomen is flat. Bowel sounds are normal. There is no distension.     Palpations: Abdomen is soft. There is no fluid wave, hepatomegaly, splenomegaly, mass or pulsatile mass.     Tenderness: There is abdominal tenderness (Right flank). There is right CVA tenderness. There is no left CVA tenderness, guarding or rebound. Negative signs include Murphy's sign and McBurney's sign.  Skin:    General: Skin is warm and dry.  Neurological:     General: No focal deficit present.     Mental Status: He is alert and oriented to person, place, and time.  Psychiatric:        Mood and Affect: Mood normal.        Behavior: Behavior normal.    UC Treatments / Results  Labs (all labs ordered are listed, but only abnormal results are displayed) Labs Reviewed  POCT URINALYSIS DIP (MANUAL ENTRY) - Abnormal; Notable for the following components:      Result Value   Color, UA straw (*)    All other components within normal limits    EKG   Radiology No results found.  Procedures Procedures (including critical care time)  Medications Ordered in UC Medications - No data to display  Initial Impression / Assessment and Plan / UC Course  I have reviewed the triage vital signs and the nursing notes.  Pertinent labs & imaging results that were available  during my care of the patient were reviewed by me and considered in my medical decision making (see chart for details).    53 year old male with a history of COPD presenting with a 1 week history of constant right flank pain.  He was laying new flooring in his home the day prior to the onset of pain.  Pain is worse  with sitting up and movement in general.  Alleviated by lying flat.  On exam there is superficial tenderness palpation over the right flank.  Positive CVA tenderness on the right as well.  He largely denies additional symptoms.  We discussed potential GI, GU, and MSK etiologies to explain his pain.  Given his persistence of pain without improvement over the course of a week, I recommended presenting to the emergency department for CT scan of the abdomen to rule out additional pathology.  He believes his pain is musculoskeletal in etiology, which is reasonable, though the persistence of pain argues against this.  He understands the given recommendations and plans to present to the emergency department when able.  Furthermore, a one-time refill of Breztri  has been sent to his pharmacy at his request.  Patient stable for discharge at this time.  Final Clinical Impressions(s) / UC Diagnoses   Final diagnoses:  Right flank pain     Discharge Instructions      As we discussed, I recommend presenting to the emergency department for a CT scan of your abdomen because of your persistent pain. I have also refilled Breztri . Return to care if symptoms worsen or do not improve.     ED Prescriptions     Medication Sig Dispense Auth. Provider   budesonide -glycopyrrolate -formoterol  (BREZTRI ) 160-9-4.8 MCG/ACT AERO inhaler Inhale 2 puffs into the lungs 2 (two) times daily. 10.7 g Melvenia Manus BRAVO, MD      PDMP not reviewed this encounter.   Melvenia Manus BRAVO, MD 03/22/24 1124

## 2024-03-22 NOTE — Discharge Instructions (Signed)
 As we discussed, I recommend presenting to the emergency department for a CT scan of your abdomen because of your persistent pain. I have also refilled Breztri . Return to care if symptoms worsen or do not improve.

## 2024-03-22 NOTE — ED Notes (Signed)
 Patient is being discharged from the Urgent Care and sent to the Emergency Department via private vehicle . Per Dr Melvenia, patient is in need of higher level of care due to abd pain. Patient is aware and verbalizes understanding of plan of care.  Vitals:   03/22/24 1038 03/22/24 1040  BP: 136/84   Pulse: 78   Resp: 18   Temp: 98.1 F (36.7 C)   SpO2: 92% 95%

## 2024-04-26 ENCOUNTER — Other Ambulatory Visit: Payer: Self-pay

## 2024-04-26 ENCOUNTER — Other Ambulatory Visit (HOSPITAL_COMMUNITY): Payer: Self-pay

## 2024-04-27 ENCOUNTER — Other Ambulatory Visit (HOSPITAL_COMMUNITY): Payer: Self-pay

## 2024-05-03 ENCOUNTER — Other Ambulatory Visit (HOSPITAL_COMMUNITY): Payer: Self-pay

## 2024-05-03 ENCOUNTER — Other Ambulatory Visit: Payer: Self-pay

## 2024-05-26 LAB — COLOGUARD: COLOGUARD: NEGATIVE

## 2024-06-02 ENCOUNTER — Other Ambulatory Visit: Payer: Self-pay | Admitting: Family Medicine

## 2024-06-02 ENCOUNTER — Other Ambulatory Visit: Payer: Self-pay

## 2024-06-02 ENCOUNTER — Other Ambulatory Visit (HOSPITAL_COMMUNITY): Payer: Self-pay

## 2024-06-02 DIAGNOSIS — J441 Chronic obstructive pulmonary disease with (acute) exacerbation: Secondary | ICD-10-CM

## 2024-06-02 DIAGNOSIS — R0981 Nasal congestion: Secondary | ICD-10-CM

## 2024-06-02 DIAGNOSIS — J4520 Mild intermittent asthma, uncomplicated: Secondary | ICD-10-CM

## 2024-06-02 MED ORDER — NIFEDIPINE ER 30 MG PO TB24
30.0000 mg | ORAL_TABLET | Freq: Every day | ORAL | 3 refills | Status: AC
  Filled 2024-06-07 (×2): qty 90, 90d supply, fill #0

## 2024-06-03 ENCOUNTER — Other Ambulatory Visit: Payer: Self-pay

## 2024-06-03 NOTE — Telephone Encounter (Signed)
 No longer under prescriber care, will refuse this request.

## 2024-06-07 ENCOUNTER — Other Ambulatory Visit (HOSPITAL_COMMUNITY): Payer: Self-pay

## 2024-06-07 ENCOUNTER — Other Ambulatory Visit: Payer: Self-pay

## 2024-06-08 ENCOUNTER — Other Ambulatory Visit (HOSPITAL_COMMUNITY): Payer: Self-pay

## 2024-06-08 ENCOUNTER — Other Ambulatory Visit: Payer: Self-pay | Admitting: Nurse Practitioner

## 2024-06-08 DIAGNOSIS — J4520 Mild intermittent asthma, uncomplicated: Secondary | ICD-10-CM

## 2024-06-08 DIAGNOSIS — J441 Chronic obstructive pulmonary disease with (acute) exacerbation: Secondary | ICD-10-CM

## 2024-06-09 ENCOUNTER — Encounter (HOSPITAL_COMMUNITY): Payer: Self-pay

## 2024-06-09 ENCOUNTER — Other Ambulatory Visit (HOSPITAL_COMMUNITY): Payer: Self-pay

## 2024-06-09 ENCOUNTER — Other Ambulatory Visit: Payer: Self-pay

## 2024-06-16 ENCOUNTER — Other Ambulatory Visit (HOSPITAL_COMMUNITY): Payer: Self-pay

## 2024-06-16 ENCOUNTER — Other Ambulatory Visit: Payer: Self-pay

## 2024-06-16 MED ORDER — BREZTRI AEROSPHERE 160-9-4.8 MCG/ACT IN AERO
2.0000 | INHALATION_SPRAY | Freq: Two times a day (BID) | RESPIRATORY_TRACT | 0 refills | Status: AC
  Filled 2024-06-16: qty 32.1, 90d supply, fill #0

## 2024-06-16 MED ORDER — IPRATROPIUM-ALBUTEROL 0.5-2.5 (3) MG/3ML IN SOLN
3.0000 mL | Freq: Four times a day (QID) | RESPIRATORY_TRACT | 0 refills | Status: AC | PRN
  Filled 2024-06-16: qty 360, 30d supply, fill #0

## 2024-06-16 MED ORDER — FLUTICASONE PROPIONATE 50 MCG/ACT NA SUSP
1.0000 | Freq: Every day | NASAL | 0 refills | Status: DC
  Filled 2024-06-16: qty 16, 60d supply, fill #0

## 2024-06-19 ENCOUNTER — Other Ambulatory Visit: Payer: Self-pay

## 2024-06-21 ENCOUNTER — Other Ambulatory Visit: Payer: Self-pay

## 2024-06-21 ENCOUNTER — Other Ambulatory Visit (HOSPITAL_COMMUNITY): Payer: Self-pay

## 2024-06-21 MED ORDER — CETIRIZINE HCL 10 MG PO TABS
10.0000 mg | ORAL_TABLET | Freq: Every day | ORAL | 11 refills | Status: AC
  Filled 2024-06-21: qty 30, 30d supply, fill #0

## 2024-06-22 ENCOUNTER — Encounter: Payer: Self-pay | Admitting: Pharmacist

## 2024-06-22 ENCOUNTER — Other Ambulatory Visit: Payer: Self-pay

## 2024-06-27 ENCOUNTER — Other Ambulatory Visit: Payer: Self-pay

## 2024-06-28 ENCOUNTER — Other Ambulatory Visit: Payer: Self-pay

## 2024-06-28 MED ORDER — QUETIAPINE FUMARATE 100 MG PO TABS
100.0000 mg | ORAL_TABLET | Freq: Every day | ORAL | 0 refills | Status: AC
  Filled 2024-07-25: qty 90, 90d supply, fill #0

## 2024-07-10 ENCOUNTER — Other Ambulatory Visit (HOSPITAL_COMMUNITY): Payer: Self-pay

## 2024-07-11 ENCOUNTER — Other Ambulatory Visit (HOSPITAL_COMMUNITY): Payer: Self-pay

## 2024-07-11 ENCOUNTER — Other Ambulatory Visit: Payer: Self-pay

## 2024-07-11 MED ORDER — OMEPRAZOLE 20 MG PO CPDR
20.0000 mg | DELAYED_RELEASE_CAPSULE | Freq: Every morning | ORAL | 3 refills | Status: AC
  Filled 2024-07-11: qty 90, 90d supply, fill #0

## 2024-07-11 MED ORDER — OLOPATADINE HCL 0.2 % OP SOLN
1.0000 [drp] | Freq: Every day | OPHTHALMIC | 11 refills | Status: AC
  Filled 2024-07-11: qty 2.5, 25d supply, fill #0

## 2024-07-11 MED ORDER — CETIRIZINE HCL 10 MG PO TABS
10.0000 mg | ORAL_TABLET | Freq: Every day | ORAL | 10 refills | Status: AC
  Filled 2024-07-11: qty 34, 34d supply, fill #0

## 2024-07-11 MED ORDER — DESONIDE 0.05 % EX OINT
1.0000 | TOPICAL_OINTMENT | Freq: Two times a day (BID) | CUTANEOUS | 3 refills | Status: AC
  Filled 2024-07-11: qty 15, 14d supply, fill #0

## 2024-07-11 MED ORDER — SERTRALINE HCL 50 MG PO TABS
50.0000 mg | ORAL_TABLET | Freq: Every day | ORAL | 3 refills | Status: AC
  Filled 2024-07-11: qty 90, 90d supply, fill #0

## 2024-07-11 MED ORDER — AZELASTINE HCL 0.1 % NA SOLN
1.0000 | Freq: Two times a day (BID) | NASAL | 3 refills | Status: AC
  Filled 2024-07-11: qty 30, 50d supply, fill #0

## 2024-07-11 MED ORDER — MONTELUKAST SODIUM 10 MG PO TABS
10.0000 mg | ORAL_TABLET | Freq: Every evening | ORAL | 3 refills | Status: AC
  Filled 2024-07-11: qty 90, 90d supply, fill #0

## 2024-07-12 ENCOUNTER — Encounter: Payer: Self-pay | Admitting: Pharmacist

## 2024-07-12 ENCOUNTER — Other Ambulatory Visit: Payer: Self-pay

## 2024-07-13 ENCOUNTER — Other Ambulatory Visit (HOSPITAL_COMMUNITY): Payer: Self-pay

## 2024-07-14 ENCOUNTER — Other Ambulatory Visit: Payer: Self-pay

## 2024-07-14 ENCOUNTER — Other Ambulatory Visit (HOSPITAL_COMMUNITY): Payer: Self-pay

## 2024-07-14 MED ORDER — CYCLOBENZAPRINE HCL 5 MG PO TABS
5.0000 mg | ORAL_TABLET | Freq: Every evening | ORAL | 0 refills | Status: AC | PRN
  Filled 2024-07-14: qty 14, 14d supply, fill #0

## 2024-07-25 ENCOUNTER — Other Ambulatory Visit: Payer: Self-pay

## 2024-07-25 ENCOUNTER — Other Ambulatory Visit (HOSPITAL_COMMUNITY): Payer: Self-pay

## 2024-09-01 ENCOUNTER — Other Ambulatory Visit: Payer: Self-pay

## 2024-09-01 ENCOUNTER — Ambulatory Visit (HOSPITAL_COMMUNITY)
Admission: EM | Admit: 2024-09-01 | Discharge: 2024-09-01 | Disposition: A | Discharge status: Home / Self Care | Attending: Emergency Medicine | Admitting: Emergency Medicine

## 2024-09-01 ENCOUNTER — Encounter (HOSPITAL_COMMUNITY): Payer: Self-pay | Admitting: Emergency Medicine

## 2024-09-01 DIAGNOSIS — K0889 Other specified disorders of teeth and supporting structures: Secondary | ICD-10-CM | POA: Diagnosis not present

## 2024-09-01 MED ORDER — CEPHALEXIN 500 MG PO CAPS
500.0000 mg | ORAL_CAPSULE | Freq: Four times a day (QID) | ORAL | 0 refills | Status: AC
  Filled 2024-09-01 (×2): qty 28, 7d supply, fill #0

## 2024-09-01 MED ORDER — EPINEPHRINE 0.3 MG/0.3ML IJ SOAJ
0.3000 mg | INTRAMUSCULAR | 0 refills | Status: AC | PRN
  Filled 2024-09-01: qty 2, 1d supply, fill #0

## 2024-09-01 MED ORDER — CEFDINIR 300 MG PO CAPS
300.0000 mg | ORAL_CAPSULE | Freq: Two times a day (BID) | ORAL | 0 refills | Status: DC
  Filled 2024-09-01: qty 14, 7d supply, fill #0

## 2024-09-01 MED ORDER — METRONIDAZOLE 500 MG PO TABS
500.0000 mg | ORAL_TABLET | Freq: Two times a day (BID) | ORAL | 0 refills | Status: AC
  Filled 2024-09-01 (×2): qty 14, 7d supply, fill #0

## 2024-09-01 NOTE — ED Triage Notes (Signed)
 Patient present for left facial swellen  and tooth pain x 3 days.  Patient has taken everything for the pain.

## 2024-09-01 NOTE — ED Provider Notes (Signed)
 " [facility]    CSN: [id number] Arrival date & time: 09/01/24  1423      History   Chief Complaint Chief Complaint  Patient presents with   Dental Pain    HPI [name] is a 54 y.o. male.   Patient presents to clinic over concern of left upper facial swelling and dental pain.  Reports he needs some teeth pulled and has known this for quite some time.  The area started to swell and become painful for the past 3 days.  Has tried Tylenol , Goody powder and ibuprofen  as well as Orajel without much improvement.  Has not had fever.  Does not currently have a dentist.    The history is provided by the patient and medical records.  Dental Pain   Past Medical History:  Diagnosis Date   Arthritis    Asthma    COPD (chronic obstructive pulmonary disease) (HCC)    GERD (gastroesophageal reflux disease)    Sciatic nerve pain     Patient Active Problem List   Diagnosis Date Noted   Pneumothorax on right 09/09/2019   CAP (community acquired pneumonia) 01/21/2016   Right middle lobe pneumonia 01/21/2016   Glucosuria with normal serum glucose 01/21/2016   Lactic acidosis 01/21/2016   CKD (chronic kidney disease) stage 3, GFR 30-59 ml/min (HCC) 01/21/2016   Cigarette smoker 06/12/2015   COPD pfts pending/ still smoking  06/11/2015   Asthma, chronic 10/16/2014   Closed fracture of right thumb 06/16/2014    Past Surgical History:  Procedure Laterality Date   I & D EXTREMITY Right 06/16/2014   Procedure: IRRIGATION AND DEBRIDEMENT RIGHT THUMB AND FIRST FINGER AND PINNING ;  Surgeon: Elsie Mussel, MD;  Location: [facility];  Service: Orthopedics;  Laterality: Right;   IRRIGATION AND DEBRIDEMENT KNEE Right 06/16/2014   Procedure: IRRIGATION AND DEBRIDEMENT POSTERIOR KNEE;  Surgeon: Elsie Mussel, MD;  Location: [facility];  Service: Orthopedics;  Laterality: Right;   LACERATION REPAIR Right 06/16/2014   Procedure: REPAIR ODF THREE LACERATIONS POSTERIOR KNEE;  Surgeon: Elsie Mussel, MD;  Location: [facility];  Service: Orthopedics;  Laterality: Right;       Home Medications    Prior to Admission medications  Medication Sig Start Date End Date Taking? Authorizing Provider  cephALEXin (KEFLEX) 500 MG capsule Take 1 capsule (500 mg total) by mouth 4 (four) times daily for 7 days. 09/01/24 09/08/24 Yes Ball, Shatara Stanek  G, FNP  EPINEPHrine 0.3 mg/0.3 mL IJ SOAJ injection Inject 0.3 mg into the muscle as needed for anaphylaxis. 09/01/24  Yes Ball, Aristeo Hankerson  G, FNP  metroNIDAZOLE (FLAGYL) 500 MG tablet Take 1 tablet (500 mg total) by mouth 2 (two) times daily. 09/01/24  Yes Ball, Ragan Reale  G, FNP  acetaminophen  (TYLENOL ) 500 MG tablet Take 2 tablets (1,000 mg total) by mouth every 6 (six) hours as needed. 09/14/19   Meuth, Brooke A, PA-C  albuterol  (2.5 MG/3ML) 0.083% NEBU 3 mL, albuterol  (5 MG/ML) 0.5% NEBU 0.5 mL Inhale into the lungs.    [provider]  albuterol  (VENTOLIN  HFA) 108 (90 Base) MCG/ACT inhaler Inhale 2 puffs into the lungs every 6 (six) hours as needed for wheezing or shortness of breath. 10/29/22   Delbert Clam, MD  albuterol  (VENTOLIN  HFA) 108 (90 Base) MCG/ACT inhaler Inhale 2 puffs into the lungs every 4 (four) hours as needed for shortness of breath or whezing. 02/01/24     azelastine  (ASTELIN ) 0.1 % nasal spray Place 1 spray into both nostrils 2 (  two) times daily. 01/19/23   Newlin, Enobong, MD  azelastine  (ASTELIN ) 0.1 % nasal spray Place 1 spray into both nostrils 2 (two) times daily. 07/10/24     betamethasone  dipropionate 0.05 % cream Apply topically 2 (two) times daily. 03/07/20   Fleming, Zelda W, NP  budesonide -glycopyrrolate -formoterol  (BREZTRI  AEROSPHERE) 160-9-4.8 MCG/ACT AERO inhaler Inhale 2 puffs into the lungs 2 (two) times daily. 06/16/24     budesonide -glycopyrrolate -formoterol  (BREZTRI ) 160-9-4.8 MCG/ACT AERO inhaler Inhale 2 puffs into the lungs 2 (two) times daily. 03/22/24   Melvenia Manus BRAVO, MD  cetirizine  (ZYRTEC ) 10 MG tablet Take 1  tablet (10 mg total) by mouth at bedtime for 7 days. 02/21/24 02/28/24  Enedelia Dorna HERO, FNP  cetirizine  (ZYRTEC ) 10 MG tablet Take 1 tablet (10 mg total) by mouth daily. 06/21/24     cetirizine  (ZYRTEC ) 10 MG tablet Take 1 tablet (10 mg total) by mouth daily. 07/10/24     cyclobenzaprine  (FLEXERIL ) 5 MG tablet Take 1 tablet (5 mg total) by mouth at bedtime as needed for muscle spasms for up to 14 days. 07/13/24     desonide  (DESOWEN ) 0.05 % ointment Apply 1 Application topically 2 (two) times daily for 14 days. 07/10/24     docusate sodium  (COLACE) 100 MG capsule Take 1 capsule (100 mg total) by mouth 2 (two) times daily. 09/14/19   Meuth, Brooke A, PA-C  famotidine  (PEPCID ) 20 MG tablet Take 1 tablet (20 mg total) by mouth at bedtime for 7 days. 02/21/24 03/22/24  Enedelia Dorna HERO, FNP  ibuprofen  (ADVIL ) 800 MG tablet Take 1 tablet (800 mg total) by mouth every 8 (eight) hours as needed. 05/03/22   Rising, Asberry, PA-C  ipratropium-albuterol  (DUONEB) 0.5-2.5 (3) MG/3ML SOLN Take 3 mLs by nebulization every 6 (six) hours as needed. 06/16/24     lidocaine  (LIDODERM ) 5 % Place 1 patch onto the skin daily. Remove & Discard patch within 12 hours or as directed by MD 08/27/22   Nivia Colon, PA-C  lidocaine  (XYLOCAINE ) 2 % solution Use as directed 15 mLs in the mouth or throat as needed for mouth pain. 05/03/22   Rising, Asberry, PA-C  montelukast  (SINGULAIR ) 10 MG tablet Take 1 tablet (10 mg total) by mouth at bedtime. 06/01/22   Hunsucker, Donnice SAUNDERS, MD  montelukast  (SINGULAIR ) 10 MG tablet Take 1 tablet (10 mg total) by mouth at bedtime. 07/10/24     Multiple Vitamin (MULTIVITAMIN WITH MINERALS) TABS tablet Take 1 tablet by mouth daily. 05/19/22   Celestia Rosaline SQUIBB, NP  NIFEdipine  (ADALAT  CC) 30 MG 24 hr tablet Take 1 Tablet by mouth once daily. 06/02/24     Olopatadine  HCl 0.2 % SOLN Place 1 drop into both eyes daily. 02/04/24     Olopatadine  HCl 0.2 % SOLN Place 1 drop into both eyes daily. 07/10/24      omeprazole  (PRILOSEC) 20 MG capsule Take 1 capsule (20 mg total) by mouth daily.Must have office visit for refills 10/15/23     omeprazole  (PRILOSEC) 20 MG capsule Take 1 capsule (20 mg total) by mouth daily before breakfast. 02/01/24     omeprazole  (PRILOSEC) 20 MG capsule Take 1 capsule (20 mg total) by mouth every morning before breakfast. 07/10/24     QUEtiapine  (SEROQUEL ) 100 MG tablet Take 1 tablet (100 mg total) by mouth at bedtime. 06/28/24     sertraline  (ZOLOFT ) 50 MG tablet Take 1 tablet (50 mg total) by mouth daily. 07/10/24     sodium chloride  (OCEAN) 0.65 % SOLN  nasal spray Place 1 spray into both nostrils as needed for congestion. 09/14/19   Meuth, Brooke A, PA-C  Zoster Vaccine Adjuvanted (SHINGRIX ) injection Inject 0.5 ml into the muscle for 1 dose. 08/07/22   Fleming, Zelda W, NP  fluticasone  (FLONASE ) 50 MCG/ACT nasal spray Place 1 spray into both nostrils daily. 06/16/24 07/10/24    lisinopril  (ZESTRIL ) 10 MG tablet Take 1 tablet (10 mg total) by mouth daily. 12/29/23 04/26/24      Family History Family History  Family history unknown: Yes    Social History Social History[1]   Allergies   Aspirin, Lisinopril , Peanut-containing drug products, Shellfish allergy, Amoxicillin, Clindamycin /lincomycin, Iodine, Kiwi extract, Naproxen , Tramadol , and Latex   Review of Systems Review of Systems  Per HPI  Physical Exam Triage Vital Signs ED Triage Vitals  Encounter Vitals Group     BP 09/01/24 1600 (!) 150/98     Girls Systolic BP Percentile --      Girls Diastolic BP Percentile --      Boys Systolic BP Percentile --      Boys Diastolic BP Percentile --      Pulse Rate 09/01/24 1600 66     Resp 09/01/24 1600 18     Temp 09/01/24 1600 98.3 F (36.8 C)     Temp Source 09/01/24 1600 Oral     SpO2 09/01/24 1600 97 %     Weight --      Height --      Head Circumference --      Peak Flow --      Pain Score 09/01/24 1558 0     Pain Loc --      Pain Education --       Exclude from Growth Chart --    No data found.  Updated Vital Signs BP (!) 150/98 (BP Location: Right Arm)   Pulse 66   Temp 98.3 F (36.8 C) (Oral)   Resp 18   SpO2 97%   Visual Acuity Right Eye Distance:   Left Eye Distance:   Bilateral Distance:    Right Eye Near:   Left Eye Near:    Bilateral Near:     Physical Exam Vitals and nursing note reviewed.  Constitutional:      Appearance: Normal appearance.  HENT:     Head: Normocephalic and atraumatic.     Right Ear: External ear normal.     Left Ear: External ear normal.     Nose: Nose normal.     Mouth/Throat:     Mouth: Mucous membranes are moist.   Eyes:     Conjunctiva/sclera: Conjunctivae normal.  Cardiovascular:     Rate and Rhythm: Normal rate.  Pulmonary:     Effort: Pulmonary effort is normal. No respiratory distress.  Skin:    General: Skin is warm and dry.  Neurological:     General: No focal deficit present.     Mental Status: He is alert.  Psychiatric:        Mood and Affect: Mood normal.      UC Treatments / Results  Labs (all labs ordered are listed, but only abnormal results are displayed) Labs Reviewed - No data to display  EKG   Radiology No results found.  Procedures Procedures (including critical care time)  Medications Ordered in UC Medications - No data to display  Initial Impression / Assessment and Plan / UC Course  I have reviewed the triage vital signs and the nursing notes.  Pertinent  labs & imaging results that were available during my care of the patient were reviewed by me and considered in my medical decision making (see chart for details).  Vitals and triage reviewed, patient is hemodynamically stable.  Swelling to the left upper drawl with overall poor dentition and some missing teeth.  Concern for dental infection.  With allergies to amoxicillin and clindamycin  will start on Keflex with Flagyl for anaerobic activity.  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  Pain management discussed.  Dental resources provided.  Plan of care, follow-up care, and return precautions given, no questions at this time.    Final Clinical Impressions(s) / UC Diagnoses   Final diagnoses:  Pain, dental     Discharge Instructions      Take the antibiotics as prescribed, take them with food to help prevent stomach upset  Continue to alternate between Tylenol  and ibuprofen  as needed for any pain or swelling Ice to the swollen area may help as well Follow-up with a dentist sometime next week for reevaluation and further management  [facility] in [place], [place]  Address: [address] [place], [place], [place] [address] Phone: [phone]  [facility] [phone] extension [phone] [address]  Dr. Encarnacion [phone] [address]  [employer] [phone] [address] [facility] [place].  [facility] [phone] extension [phone], [place], [place], [address] First come first serve for the first 10 clients.  May do simple extractions only, no wisdom teeth or surgery.  You may try the second for Thursday of the month starting at 6:30 AM.  [facility] You may call the school to see if they are still helping to provide dental care for emergent cases.      ED Prescriptions     Medication Sig Dispense Auth. Provider   cefdinir  (OMNICEF ) 300 MG capsule  (Status: Discontinued) Take 1 capsule (300 mg total) by mouth 2 (two) times daily for 7 days. 14 capsule Ball, Larnie Heart  G, FNP   EPINEPHrine 0.3 mg/0.3 mL IJ SOAJ injection Inject 0.3 mg into the muscle as needed for anaphylaxis. 2 each Mercer, Helga Asbury  G, FNP   metroNIDAZOLE (FLAGYL) 500 MG tablet Take 1 tablet (500 mg total) by mouth 2 (two) times daily. 14 tablet Ball, Saranne Crislip  G, FNP   cephALEXin (KEFLEX) 500 MG capsule Take 1 capsule (500 mg total) by mouth 4  (four) times daily for 7 days. 28 capsule Ball, Deasha Clendenin  G, FNP      PDMP not reviewed this encounter.     [1]  Social History Tobacco Use   Smoking status: Some Days    Current packs/day: 0.50    Average packs/day: 0.5 packs/day for 31.0 years (15.5 ttl pk-yrs)    Types: Cigarettes   Smokeless tobacco: Never   Tobacco comments:    Pt states he smoke 1/2 ppd. ALS 05/14/22  Vaping Use   Vaping status: Never Used  Substance Use Topics   Alcohol use: Yes    Alcohol/week: 12.0 standard drinks of alcohol    Types: 12 Cans of beer per week   Drug use: Not Currently    Types: Marijuana     Mercer Alfred MATSU, FNP 09/01/24 1631  "

## 2024-09-01 NOTE — Discharge Instructions (Addendum)
 Take the antibiotics as prescribed, take them with food to help prevent stomach upset  Continue to alternate between Tylenol  and ibuprofen  as needed for any pain or swelling Ice to the swollen area may help as well Follow-up with a dentist sometime next week for reevaluation and further management  [facility] in [place], [place]  Address: [address] [place], [place], [place] [address] Phone: [phone]  [facility] [phone] extension [phone] [address]  Dr. Encarnacion [phone] [address]  [employer] [phone] [address] [facility] [place].  [facility] [phone] extension [phone], [place], [place], [address] First come first serve for the first 10 clients.  May do simple extractions only, no wisdom teeth or surgery.  You may try the second for Thursday of the month starting at 6:30 AM.  [facility] You may call the school to see if they are still helping to provide dental care for emergent cases.
